# Patient Record
Sex: Male | Born: 1960 | Race: Black or African American | Hispanic: No | Marital: Single | Smoking: Light tobacco smoker
Health system: Southern US, Community
[De-identification: ages and names within clinical notes are randomized; demographics above are authoritative.]

## PROBLEM LIST (undated history)

## (undated) DIAGNOSIS — S22080A Wedge compression fracture of T11-T12 vertebra, initial encounter for closed fracture: Secondary | ICD-10-CM

## (undated) DIAGNOSIS — C61 Malignant neoplasm of prostate: Secondary | ICD-10-CM

## (undated) DIAGNOSIS — C7951 Secondary malignant neoplasm of bone: Secondary | ICD-10-CM

## (undated) HISTORY — PX: NO PAST SURGERIES: SHX2092

---

## 2017-07-12 ENCOUNTER — Ambulatory Visit (INDEPENDENT_AMBULATORY_CARE_PROVIDER_SITE_OTHER): Payer: Self-pay | Admitting: Physician Assistant

## 2017-07-26 ENCOUNTER — Emergency Department (HOSPITAL_COMMUNITY)
Admission: EM | Admit: 2017-07-26 | Discharge: 2017-07-26 | Disposition: A | Discharge status: Home / Self Care | Payer: Medicaid Other | Attending: Emergency Medicine | Admitting: Emergency Medicine

## 2017-07-26 ENCOUNTER — Other Ambulatory Visit: Payer: Self-pay

## 2017-07-26 ENCOUNTER — Encounter (HOSPITAL_COMMUNITY): Payer: Self-pay | Admitting: *Deleted

## 2017-07-26 DIAGNOSIS — R1084 Generalized abdominal pain: Secondary | ICD-10-CM | POA: Diagnosis present

## 2017-07-26 DIAGNOSIS — R109 Unspecified abdominal pain: Secondary | ICD-10-CM

## 2017-07-26 LAB — COMPREHENSIVE METABOLIC PANEL
ALT: 12 U/L — ABNORMAL LOW (ref 17–63)
AST: 20 U/L (ref 15–41)
Albumin: 3.5 g/dL (ref 3.5–5.0)
Alkaline Phosphatase: 99 U/L (ref 38–126)
Anion gap: 9 (ref 5–15)
BUN: 17 mg/dL (ref 6–20)
CO2: 23 mmol/L (ref 22–32)
Calcium: 9.2 mg/dL (ref 8.9–10.3)
Chloride: 108 mmol/L (ref 101–111)
Creatinine, Ser: 1.23 mg/dL (ref 0.61–1.24)
GFR calc Af Amer: >60 mL/min (ref >60)
GFR calc non Af Amer: >60 mL/min (ref >60)
Glucose, Bld: 98 mg/dL (ref 65–99)
Potassium: 4.5 mmol/L (ref 3.5–5.1)
Sodium: 140 mmol/L (ref 135–145)
Total Bilirubin: 0.7 mg/dL (ref 0.3–1.2)
Total Protein: 7.3 g/dL (ref 6.5–8.1)

## 2017-07-26 LAB — LIPASE, BLOOD: Lipase: 32 U/L (ref 11–51)

## 2017-07-26 LAB — CBC
HCT: 45.7 % (ref 39.0–52.0)
Hemoglobin: 16.1 g/dL (ref 13.0–17.0)
MCH: 28.7 pg (ref 26.0–34.0)
MCHC: 35.2 g/dL (ref 30.0–36.0)
MCV: 81.5 fL (ref 78.0–100.0)
Platelets: 249 10*3/uL (ref 150–400)
RBC: 5.61 MIL/uL (ref 4.22–5.81)
RDW: 14.1 % (ref 11.5–15.5)
WBC: 7 10*3/uL (ref 4.0–10.5)

## 2017-07-26 LAB — URINALYSIS, ROUTINE W REFLEX MICROSCOPIC
Bilirubin Urine: NEGATIVE
Glucose, UA: NEGATIVE mg/dL
Hgb urine dipstick: NEGATIVE
Ketones, ur: NEGATIVE mg/dL
Leukocytes, UA: NEGATIVE
Nitrite: NEGATIVE
Protein, ur: NEGATIVE mg/dL
Specific Gravity, Urine: 1.013 (ref 1.005–1.030)
pH: 5 (ref 5.0–8.0)

## 2017-07-26 MED ORDER — SODIUM CHLORIDE 0.9 % IV BOLUS (SEPSIS)
1000.0000 mL | Freq: Once | INTRAVENOUS | Status: AC
  Administered 2017-07-26: 1000 mL via INTRAVENOUS

## 2017-07-26 MED ORDER — PREDNISONE 10 MG (21) PO TBPK: ORAL_TABLET | Freq: Every day | ORAL | 0 refills | Status: DC

## 2017-07-26 MED ORDER — KETOROLAC TROMETHAMINE 30 MG/ML IJ SOLN
30.0000 mg | Freq: Once | INTRAMUSCULAR | Status: AC
  Administered 2017-07-26: 30 mg via INTRAVENOUS
  Filled 2017-07-26: qty 1
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
## 2017-07-26 NOTE — ED Triage Notes (Signed)
To ED for eval of RLQ pain and right flank pain 'for a while'. States pain never fully goes away. No pain with urination or defecation. No vomiting. Denies known fevers at home.

## 2017-07-26 NOTE — Discharge Instructions (Signed)
1. Medications: Start taking prednisone taper as prescribed.  Do not take any ibuprofen, Motrin, or Aleve while on this medicine.  You may take [phone] mg of Tylenol every 6 hours as needed for pain additionally.  Do not exceed 4000 mg of Tylenol daily. 2. Treatment: rest, drink plenty of fluids, gentle stretching as discussed, alternate ice and heat 3. Follow Up: Please followup with your primary doctor in 3 days for discussion of your diagnoses and further evaluation after today's visit; if you do not have a primary care doctor use the resource guide provided to find one;  Return to the ER for worsening back pain, difficulty walking, loss of bowel or bladder control or other concerning symptoms

## 2017-09-12 ENCOUNTER — Emergency Department (HOSPITAL_COMMUNITY): Payer: Medicaid Other

## 2017-09-12 ENCOUNTER — Inpatient Hospital Stay (HOSPITAL_COMMUNITY)
Admission: EM | Admit: 2017-09-12 | Discharge: 2017-09-19 | DRG: 516 | Disposition: A | Discharge status: Home / Self Care | Payer: Medicaid Other | Attending: Family Medicine | Admitting: Family Medicine

## 2017-09-12 ENCOUNTER — Encounter (HOSPITAL_COMMUNITY): Payer: Self-pay

## 2017-09-12 ENCOUNTER — Other Ambulatory Visit: Payer: Self-pay

## 2017-09-12 DIAGNOSIS — C7951 Secondary malignant neoplasm of bone: Principal | ICD-10-CM | POA: Diagnosis present

## 2017-09-12 DIAGNOSIS — F17210 Nicotine dependence, cigarettes, uncomplicated: Secondary | ICD-10-CM | POA: Diagnosis present

## 2017-09-12 DIAGNOSIS — K5903 Drug induced constipation: Secondary | ICD-10-CM | POA: Diagnosis present

## 2017-09-12 DIAGNOSIS — M8448XA Pathological fracture, other site, initial encounter for fracture: Secondary | ICD-10-CM | POA: Diagnosis present

## 2017-09-12 DIAGNOSIS — C801 Malignant (primary) neoplasm, unspecified: Secondary | ICD-10-CM

## 2017-09-12 DIAGNOSIS — T380X5A Adverse effect of glucocorticoids and synthetic analogues, initial encounter: Secondary | ICD-10-CM | POA: Diagnosis present

## 2017-09-12 DIAGNOSIS — T40605A Adverse effect of unspecified narcotics, initial encounter: Secondary | ICD-10-CM | POA: Diagnosis present

## 2017-09-12 DIAGNOSIS — D72829 Elevated white blood cell count, unspecified: Secondary | ICD-10-CM | POA: Diagnosis present

## 2017-09-12 DIAGNOSIS — N179 Acute kidney failure, unspecified: Secondary | ICD-10-CM | POA: Diagnosis present

## 2017-09-12 DIAGNOSIS — G893 Neoplasm related pain (acute) (chronic): Secondary | ICD-10-CM | POA: Diagnosis present

## 2017-09-12 DIAGNOSIS — C61 Malignant neoplasm of prostate: Secondary | ICD-10-CM | POA: Diagnosis present

## 2017-09-12 DIAGNOSIS — C787 Secondary malignant neoplasm of liver and intrahepatic bile duct: Secondary | ICD-10-CM | POA: Diagnosis present

## 2017-09-12 DIAGNOSIS — M545 Low back pain: Secondary | ICD-10-CM | POA: Diagnosis present

## 2017-09-12 LAB — COMPREHENSIVE METABOLIC PANEL
ALT: 23 U/L (ref 17–63)
AST: 28 U/L (ref 15–41)
Albumin: 3.5 g/dL (ref 3.5–5.0)
Alkaline Phosphatase: 106 U/L (ref 38–126)
Anion gap: 10 (ref 5–15)
BUN: 23 mg/dL — ABNORMAL HIGH (ref 6–20)
CO2: 26 mmol/L (ref 22–32)
Calcium: 9.1 mg/dL (ref 8.9–10.3)
Chloride: 103 mmol/L (ref 101–111)
Creatinine, Ser: 1.51 mg/dL — ABNORMAL HIGH (ref 0.61–1.24)
GFR calc Af Amer: 58 mL/min — ABNORMAL LOW (ref >60)
GFR calc non Af Amer: 50 mL/min — ABNORMAL LOW (ref >60)
Glucose, Bld: 102 mg/dL — ABNORMAL HIGH (ref 65–99)
Potassium: 4.1 mmol/L (ref 3.5–5.1)
Sodium: 139 mmol/L (ref 135–145)
Total Bilirubin: 1.2 mg/dL (ref 0.3–1.2)
Total Protein: 6.9 g/dL (ref 6.5–8.1)

## 2017-09-12 LAB — CBC WITH DIFFERENTIAL/PLATELET
Basophils Absolute: 0 10*3/uL (ref 0.0–0.1)
Basophils Relative: 0 %
Eosinophils Absolute: 0.1 10*3/uL (ref 0.0–0.7)
Eosinophils Relative: 1 %
HCT: 45.5 % (ref 39.0–52.0)
Hemoglobin: 16.2 g/dL (ref 13.0–17.0)
Lymphocytes Relative: 22 %
Lymphs Abs: 3.3 10*3/uL (ref 0.7–4.0)
MCH: 29.1 pg (ref 26.0–34.0)
MCHC: 35.6 g/dL (ref 30.0–36.0)
MCV: 81.8 fL (ref 78.0–100.0)
Monocytes Absolute: 1.1 10*3/uL — ABNORMAL HIGH (ref 0.1–1.0)
Monocytes Relative: 7 %
Neutro Abs: 10.4 10*3/uL — ABNORMAL HIGH (ref 1.7–7.7)
Neutrophils Relative %: 70 %
Platelets: 297 10*3/uL (ref 150–400)
RBC: 5.56 MIL/uL (ref 4.22–5.81)
RDW: 15.3 % (ref 11.5–15.5)
WBC: 14.9 10*3/uL — ABNORMAL HIGH (ref 4.0–10.5)

## 2017-09-12 MED ORDER — ENOXAPARIN SODIUM 40 MG/0.4ML ~~LOC~~ SOLN
40.0000 mg | SUBCUTANEOUS | Status: DC
  Administered 2017-09-12 – 2017-09-18 (×7): 40 mg via SUBCUTANEOUS
  Filled 2017-09-12 (×7): qty 0.4

## 2017-09-12 MED ORDER — ENOXAPARIN SODIUM 40 MG/0.4ML ~~LOC~~ SOLN: 40.0000 mg | SUBCUTANEOUS | Status: DC

## 2017-09-12 MED ORDER — OXYCODONE HCL 5 MG PO TABS
5.0000 mg | ORAL_TABLET | ORAL | Status: DC | PRN
  Administered 2017-09-12 – 2017-09-17 (×19): 5 mg via ORAL
  Filled 2017-09-12 (×19): qty 1

## 2017-09-12 MED ORDER — SODIUM CHLORIDE 0.9% FLUSH
3.0000 mL | Freq: Two times a day (BID) | INTRAVENOUS | Status: DC
  Administered 2017-09-16 – 2017-09-18 (×4): 3 mL via INTRAVENOUS

## 2017-09-12 MED ORDER — MORPHINE SULFATE (PF) 4 MG/ML IV SOLN
1.0000 mg | INTRAVENOUS | Status: DC | PRN
  Administered 2017-09-12: 2 mg via INTRAVENOUS
  Filled 2017-09-12: qty 1

## 2017-09-12 MED ORDER — SODIUM CHLORIDE 0.9 % IV SOLN
INTRAVENOUS | Status: DC
  Administered 2017-09-12 – 2017-09-18 (×10): via INTRAVENOUS

## 2017-09-12 MED ORDER — OXYCODONE-ACETAMINOPHEN 5-325 MG PO TABS
1.0000 | ORAL_TABLET | Freq: Once | ORAL | Status: AC
  Administered 2017-09-12: 1 via ORAL
  Filled 2017-09-12: qty 1

## 2017-09-12 NOTE — ED Notes (Signed)
Neuro surgery at bedside.

## 2017-09-12 NOTE — ED Notes (Signed)
Pt and family updated to delay. Per MRI, multiple patients to be transported to scan before patient. Pt and family verbalized understanding. Awaiting hospitalist consult at this time.

## 2017-09-12 NOTE — ED Provider Notes (Signed)
[facility] Provider Note   CSN: [id number] Arrival date & time: 09/12/17  1149     History   Chief Complaint Chief Complaint  Patient presents with  . Back Pain    HPI Jonathan Campbell is a 57 y.o. male who presents to the ED for evaluation of right right-sided back pain for 2 months.  He was seen and evaluated here when symptoms first began and was removed without for kidney disease, pyelonephritis and nephrolithiasis.  He completed his course of steroids and has been taking Tylenol and using muscle rub in the area with mild to no improvement in his symptoms.  Symptoms began after an incident at work as he works as doing Theatre manager for [employer].  No previous history of similar symptoms in the past.  Pain is worse with palpation and movement.  Cannot recall any other injury or fall that may have triggered the pain.  Denies any fevers, prior back surgeries, history of cancer, history of IV drug use, numbness in legs, dysuria, hematuria, prior kidney issues, changes in gait.  HPI  History reviewed. No pertinent past medical history.  There are no active problems to display for this patient.   History reviewed. No pertinent surgical history.     Home Medications    Prior to Admission medications   Medication Sig Start Date End Date Taking? Authorizing Provider  acetaminophen (TYLENOL) 500 MG tablet Take 1,000 mg by mouth every 6 (six) hours as needed for mild pain.    [provider]  predniSONE (STERAPRED UNI-PAK 21 TAB) 10 MG (21) TBPK tablet Take by mouth daily. Take 6 tabs by mouth daily  for 2 days, then 5 tabs for 2 days, then 4 tabs for 2 days, then 3 tabs for 2 days, 2 tabs for 2 days, then 1 tab by mouth daily for 2 days 07/26/17   Renita Papa, PA-C    Family History History reviewed. No pertinent family history.  Social History Social History   Tobacco Use  . Smoking status: Current Some Day Smoker    Types: Cigars   Substance Use Topics  . Alcohol use: Yes    Comment: occ  . Drug use: Yes    Types: Marijuana    Comment: occ     Allergies   Patient has no known allergies.   Review of Systems Review of Systems  Constitutional: Negative for appetite change, chills and fever.  HENT: Negative for ear pain, rhinorrhea, sneezing and sore throat.   Eyes: Negative for photophobia and visual disturbance.  Respiratory: Negative for cough, chest tightness, shortness of breath and wheezing.   Cardiovascular: Negative for chest pain and palpitations.  Gastrointestinal: Negative for abdominal pain, blood in stool, constipation, diarrhea, nausea and vomiting.  Genitourinary: Negative for dysuria, flank pain, hematuria, penile swelling, testicular pain and urgency.  Musculoskeletal: Positive for back pain. Negative for gait problem, joint swelling, myalgias, neck pain and neck stiffness.  Skin: Negative for rash and wound.  Neurological: Negative for dizziness, weakness, light-headedness and numbness.     Physical Exam Updated Vital Signs BP 125/79   Pulse (!) 58   Temp 98 F (36.7 C) (Oral)   Resp 14   Ht 5\' 9"  (1.753 m)   Wt 74.8 kg (165 lb)   SpO2 97%   BMI 24.37 kg/m   Physical Exam  Constitutional: He appears well-developed and well-nourished. No distress.  Nontoxic-appearing no acute distress.  Ambulating with normal gait.  HENT:  Head: Normocephalic and atraumatic.  Eyes: Conjunctivae and EOM are normal. No scleral icterus.  Neck: Normal range of motion.  Pulmonary/Chest: Effort normal. No respiratory distress.  Musculoskeletal:       Back:  No midline spinal tenderness present in lumbar, thoracic or cervical spine. No step-off palpated. No visible bruising, edema or temperature change noted. No objective signs of numbness present. No saddle anesthesia. 2+ DP pulses bilaterally. Sensation intact to light touch. Strength 5/5 in bilateral lower extremities.  Neurological: He is alert.    Skin: No rash noted. He is not diaphoretic.  Psychiatric: He has a normal mood and affect.  Nursing note and vitals reviewed.    ED Treatments / Results  Labs (all labs ordered are listed, but only abnormal results are displayed) Labs Reviewed  CBC WITH DIFFERENTIAL/PLATELET - Abnormal; Notable for the following components:      Result Value   WBC 14.9 (*)    Neutro Abs 10.4 (*)    Monocytes Absolute 1.1 (*)    All other components within normal limits  COMPREHENSIVE METABOLIC PANEL - Abnormal; Notable for the following components:   Glucose, Bld 102 (*)    BUN 23 (*)    Creatinine, Ser 1.51 (*)    GFR calc non Af Amer 50 (*)    GFR calc Af Amer 58 (*)    All other components within normal limits    EKG  EKG Interpretation None       Radiology Dg Chest 2 View  Result Date: 09/12/2017 CLINICAL DATA:  CT today revealing lytic lesion in the right T12 vertebral body. Pt has been feeling unwell and suffering from back and side pain since January. EXAM: CHEST - 2 VIEW COMPARISON:  CT abdomen from earlier same day. No previous chest x-ray. FINDINGS: Heart size and mediastinal contours are normal. Lungs are clear. No pleural effusion seen. No acute or suspicious osseous findings. Lytic changes at the T12 vertebral body level are better demonstrated on earlier CT IMPRESSION: No active cardiopulmonary disease. No evidence of primary intrathoracic malignancy identified. Electronically Signed   By: Franki Cabot M.D.   On: 09/12/2017 15:20   Ct Renal Stone Study  Result Date: 09/12/2017 CLINICAL DATA:  Right flank pain for 1 month. EXAM: CT ABDOMEN AND PELVIS WITHOUT CONTRAST TECHNIQUE: Multidetector CT imaging of the abdomen and pelvis was performed following the standard protocol without IV contrast. COMPARISON:  None. FINDINGS: Lower chest: Tiny calcified nodule in the left lower lobe. Minimal bibasilar scarring. No pleural effusion. Normal heart size. Hepatobiliary: Innumerable  low-density lesions throughout the liver measuring up to 6 cm in size and with attenuation suggestive of cysts. Hepatic enlargement, with the right lobe measuring 20 cm in craniocaudal length. Underdistended gallbladder containing intermediate density material, possibly concentrated bile/sludge. No gross gallbladder wall thickening or acute pericholecystic inflammatory changes. No biliary dilatation. Pancreas: Unremarkable. Spleen: Unremarkable. Adrenals/Urinary Tract: Unremarkable adrenal glands. Subcentimeter low-density lesion in the left kidney, too small to fully characterize. No renal calculi or hydronephrosis. Unremarkable bladder. Stomach/Bowel: The stomach is within normal limits. There is no evidence of bowel obstruction or inflammation. The appendix is unremarkable. Vascular/Lymphatic: Normal caliber of the abdominal aorta. Borderline to mildly enlarged lower left para-aortic lymph node measuring 10 mm in short axis. Enlarged left common iliac lymph node measures 1.6 cm. Enlarged external iliac lymph nodes measure 1.6 cm on the right and 2.8 and 2.2 cm on the left. Reproductive: Mild enlargement of the prostate gland. Other: No intraperitoneal free  fluid.  No abdominal wall hernia. Musculoskeletal: 3.3 cm predominantly lytic lesion in the right T12 vertebral body and posterior elements with right-sided vertebral compression fracture demonstrating 35% height loss. There is focal disruption of the posterior T12 vertebral body cortex and slight right-sided vertebral body retropulsion with possible small volume epidural tumor and likely mild spinal stenosis. 5 mm lytic lesion posteriorly in the T11 vertebral body. 6 mm lytic lesion in the left L4 inferior articular process. IMPRESSION: 1. Multiple lytic lesions in the spine and bilateral pelvic lymphadenopathy concerning for metastatic disease. 2. 3.3 cm lesion at T12 with pathologic compression fracture and possible small volume epidural tumor. Consider  contrast-enhanced thoracic spine MRI for further evaluation. 3. Enlarged liver containing innumerable low-density lesions. Considerations include metastases and benign polycystic liver disease. Contrast-enhanced abdominal MRI is recommended for further evaluation. Electronically Signed   By: Logan Bores M.D.   On: 09/12/2017 14:43    Procedures Procedures (including critical care time)  Medications Ordered in ED Medications  oxyCODONE-acetaminophen (PERCOCET/ROXICET) 5-325 MG per tablet 1 tablet (1 tablet Oral Given 09/12/17 1550)     Initial Impression / Assessment and Plan / ED Course  I have reviewed the triage vital signs and the nursing notes.  Pertinent labs & imaging results that were available during my care of the patient were reviewed by me and considered in my medical decision making (see chart for details).     Patient presents to ED for evaluation of almost 50-month history of right-sided back pain.  He was seen and evaluated at the ED when symptoms began and was discharged home with prednisone pack after negative UA and unremarkable lab work.  He tells me that he completed the course of steroids but that his pain is not improved despite the use of over-the-counter medications and muscle rubs as well.  He is concerned because his brother had similar symptoms recently and was diagnosed with kidney cancer and had to undergo a right-sided nephrectomy.  On physical exam patient has a focal area of tenderness noted to the right side of the thoracic paraspinal musculature.  He denies any cough, hemoptysis, loss of bowel or bladder function, numbness in legs, weight loss, changes in appetite or night sweats.  He smokes tobacco intermittently.  CT renal stone study showed several lesions in the spine and bilateral pelvic lymphadenopathy that was concerning for metastatic disease.  It also showed 3.3 cm lesion at T12 with pathologic compression fracture and possibility of small volume epidural  tumor.  Recommended contrast enhanced thoracic spine MRI.  No neurological deficits noted and ambulating with normal gait. After speaking to the patient about this, we both agreed that would be best for patient be admitted for further workup to find the source of his possible malignant disease.  Lab work with mild elevation BUN and creatinine as compared to 6 weeks ago.  Chest x-ray revealed no signs of primary intrathoracic malignancy.  Will admit to hospitalist for further management and workup of patient's possible malignant disease.  Patient is agreement with this plan. Spoke to hospitalist who requests we contact neurosurgery for their plan. Contacted Dr. Christella Noa of neurosurgery who will come down to evaluate the patient. MRI pending at this time. Discussed with and seen by my attending, Dr. Venora Maples.  Portions of this note were generated with [employer]. Dictation errors may occur despite best attempts at proofreading.   Final Clinical Impressions(s) / ED Diagnoses   Final diagnoses:  None    ED  Discharge Orders    None       Delia Heady, PA-C 09/12/17 1747    Jola Schmidt, MD 09/13/17 1124

## 2017-09-12 NOTE — Progress Notes (Signed)
Received from ED, wife at bedside, oriented to room and surroundings. Denies nausea/pain at this time

## 2017-09-12 NOTE — ED Notes (Signed)
Provider at bedside

## 2017-09-12 NOTE — ED Notes (Signed)
Pt ambulated to bathroom with steady gait. 

## 2017-09-12 NOTE — ED Triage Notes (Signed)
Pt endorses lower right back pain x 1.5 months. Pt seen here then, had blood work drawn and urine and was told "my kidneys are fine" Pain is worse with movement. VSS. Denies urinary sx.

## 2017-09-12 NOTE — ED Notes (Signed)
Diet tray ordered 

## 2017-09-12 NOTE — ED Notes (Signed)
Attempted report x1. 

## 2017-09-12 NOTE — H&P (Signed)
57 year old male no significant past medical history seen 06/2017 in the emergency room for chest pain flank pain given steroid Dosepak as well as pain medication and had no significant improvement he had a repeat episode and came to emergency room to get checked out  At first visit when he came to the emergency room labs were normal although he had a mildly elevated BUN/creatinine 17/1.2  Today comes back and his urine/creatinine has bumped to 23/1.5 he has a leukocytosis of 14 predominant left shift platelets 297 hemoglobin is 16 because of the pain he had a CT renal stone study that was performed showing lytic lesions in the spine bilateral pelvic lymphadenopathy concerning for metastatic disease 3.3 lesion at T12 with pathologic compression fracture and small volume epidural tumor and an enlarged liver with multiple innumerable low-density lesions an MRI was ordered by the emergency room   he has been taking without much relief Aspercreme, aspirin, ibuprofen and steroids that were prescribed to him  Review of systems Patient states that he has no other real complaints other than 7/10 pain in the back He has no saddle anesthesia no overall weakness no loss of continence of bowel or bladder no nausea no vomiting no hematuria no melena no chest pain no fever no chills no nausea no weight loss no night sweats, he has no lower extremity edema he has no unilateral weakness he has no seizure-like activity  Pain noted in flank area of right side with radiating pattern down area from T12 down paravertebral muscle feels like tripping of water he says  Past medical history none  Social history-smokes 1-2 cigarettes a day smokes more marijuana than tobacco, 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 \ Family history father and mother died of cancer unknown type Brother had a kidney removed secondary to kidney cancer Grandparents 1  of them had pancreatic cancer Family has diabetes mellitus runs in the family and hypertension  BP 125/79   Pulse (!) 58   Temp 98 F (36.7 C) (Oral)   Resp 14   Ht 5\' 9"  (1.753 m)   Wt 74.8 kg (165 lb)   SpO2 97%   BMI 24.37 kg/m    Awake alert pleasant no distress EOMI NCAT slight scleral icterus Abdomen soft nontender no rebound No lymphadenopathy S1-S2 no murmur rub or gallop Smile symmetric Sensory and lower extremities is normal all the way up to mid thigh area Power is 5/5 although slight discrepancy between right and left side he is slightly weak on the right side   Assessment/plan 57 year old male with no medical history now with symptoms signs concerning for metastatic cancer unknown primary  Plan  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  Epidural tumor-we will ask neurosurgery to see the patient once MRI of the thoracic spine is done that better characterizes a tumor hopefully we can get a biopsy and then decide on workup of the same with stains and we will refer him to oncology at that time.  I have not discussed with him further diagnostics but prognosis is not to good if he has metastatic disease-he will need multidisciplinary follow-up as an outpatient For his pain now place him on oxycodone with morphine as a follow-up for severe pain  Acute kidney injury secondary probably to nonsteroidal meds we will ask him to use IV saline 100 cc an hour for the next 24 hours and hopefully this will resolve  Leukocytosis potentially secondary to use of steroids recently versus unknown primary  Lovenox, inpatient, discussed with fianc, MedSurg

## 2017-09-12 NOTE — ED Notes (Signed)
Patient transported to CT 

## 2017-09-12 NOTE — ED Notes (Signed)
Main pharmacy called for pain medication verfication

## 2017-09-12 NOTE — Consult Note (Signed)
Reason for Consult:pathologic T12 fracture Referring Physician: ED  Jonathan Campbell is an 57 y.o. male.  HPI: whom has dealt with back and right flank pain. He presented to the [facility] ED approximately 6 weeks ago. No imaging performed, he was given prednisone. The prednisone did not relieve his pain. He may very well have had the fracture occur when he was pushing the car prior to the first ed visit. He reports urinary urgency, and frequency. No incontinence bowel, and or bladder. No falls, no weakness, or sensory changes reported. CT shows multiple lytic lesions and a pathologic fracture at T12, and multiple enlarged lymph nodes in the pelvis, along with a host of cystic lesions in the liver. Called regarding the spinal lesions.   History reviewed. No pertinent past medical history.  History reviewed. No pertinent surgical history.  History reviewed. No pertinent family history.  Social History:  reports that he has been smoking cigars.  He does not have any smokeless tobacco history on file. He reports that he drinks alcohol. He reports that he uses drugs. Drug: Marijuana.  Allergies: No Known Allergies  Medications: I have reviewed the patient's current medications.  Results for orders placed or performed during the hospital encounter of 09/12/17 (from the past 48 hour(s))  CBC with Differential     Status: Abnormal   Collection Time: 09/12/17  3:55 PM  Result Value Ref Range   WBC 14.9 (H) 4.0 - 10.5 K/uL   RBC 5.56 4.22 - 5.81 MIL/uL   Hemoglobin 16.2 13.0 - 17.0 g/dL   HCT 45.5 39.0 - 52.0 %   MCV 81.8 78.0 - 100.0 fL   MCH 29.1 26.0 - 34.0 pg   MCHC 35.6 30.0 - 36.0 g/dL   RDW 15.3 11.5 - 15.5 %   Platelets 297 150 - 400 K/uL   Neutrophils Relative % 70 %   Neutro Abs 10.4 (H) 1.7 - 7.7 K/uL   Lymphocytes Relative 22 %   Lymphs Abs 3.3 0.7 - 4.0 K/uL   Monocytes Relative 7 %   Monocytes Absolute 1.1 (H) 0.1 - 1.0 K/uL   Eosinophils Relative 1 %   Eosinophils Absolute 0.1 0.0 -  0.7 K/uL   Basophils Relative 0 %   Basophils Absolute 0.0 0.0 - 0.1 K/uL    Comment: Performed at [facility], [address], [place], [place] [address]  Comprehensive metabolic panel     Status: Abnormal   Collection Time: 09/12/17  3:55 PM  Result Value Ref Range   Sodium 139 135 - 145 mmol/L   Potassium 4.1 3.5 - 5.1 mmol/L   Chloride 103 101 - 111 mmol/L   CO2 26 22 - 32 mmol/L   Glucose, Bld 102 (H) 65 - 99 mg/dL   BUN 23 (H) 6 - 20 mg/dL   Creatinine, Ser 1.51 (H) 0.61 - 1.24 mg/dL   Calcium 9.1 8.9 - 10.3 mg/dL   Total Protein 6.9 6.5 - 8.1 g/dL   Albumin 3.5 3.5 - 5.0 g/dL   AST 28 15 - 41 U/L   ALT 23 17 - 63 U/L   Alkaline Phosphatase 106 38 - 126 U/L   Total Bilirubin 1.2 0.3 - 1.2 mg/dL   GFR calc non Af Amer 50 (L) >60 mL/min   GFR calc Af Amer 58 (L) >60 mL/min    Comment: (NOTE) The eGFR has been calculated using the CKD EPI equation. This calculation has not been validated in all clinical situations. eGFR's persistently <  60 mL/min signify possible Chronic Kidney Disease.    Anion gap 10 5 - 15    Comment: Performed at [facility] [address], [place], [place] [address]    Dg Chest 2 View  Result Date: 09/12/2017 CLINICAL DATA:  CT today revealing lytic lesion in the right T12 vertebral body. Pt has been feeling unwell and suffering from back and side pain since January. EXAM: CHEST - 2 VIEW COMPARISON:  CT abdomen from earlier same day. No previous chest x-ray. FINDINGS: Heart size and mediastinal contours are normal. Lungs are clear. No pleural effusion seen. No acute or suspicious osseous findings. Lytic changes at the T12 vertebral body level are better demonstrated on earlier CT IMPRESSION: No active cardiopulmonary disease. No evidence of primary intrathoracic malignancy identified. Electronically Signed   By: Franki Cabot M.D.   On: 09/12/2017 15:20   Ct Renal Stone Study  Result Date: 09/12/2017 CLINICAL DATA:  Right flank pain for 1  month. EXAM: CT ABDOMEN AND PELVIS WITHOUT CONTRAST TECHNIQUE: Multidetector CT imaging of the abdomen and pelvis was performed following the standard protocol without IV contrast. COMPARISON:  None. FINDINGS: Lower chest: Tiny calcified nodule in the left lower lobe. Minimal bibasilar scarring. No pleural effusion. Normal heart size. Hepatobiliary: Innumerable low-density lesions throughout the liver measuring up to 6 cm in size and with attenuation suggestive of cysts. Hepatic enlargement, with the right lobe measuring 20 cm in craniocaudal length. Underdistended gallbladder containing intermediate density material, possibly concentrated bile/sludge. No gross gallbladder wall thickening or acute pericholecystic inflammatory changes. No biliary dilatation. Pancreas: Unremarkable. Spleen: Unremarkable. Adrenals/Urinary Tract: Unremarkable adrenal glands. Subcentimeter low-density lesion in the left kidney, too small to fully characterize. No renal calculi or hydronephrosis. Unremarkable bladder. Stomach/Bowel: The stomach is within normal limits. There is no evidence of bowel obstruction or inflammation. The appendix is unremarkable. Vascular/Lymphatic: Normal caliber of the abdominal aorta. Borderline to mildly enlarged lower left para-aortic lymph node measuring 10 mm in short axis. Enlarged left common iliac lymph node measures 1.6 cm. Enlarged external iliac lymph nodes measure 1.6 cm on the right and 2.8 and 2.2 cm on the left. Reproductive: Mild enlargement of the prostate gland. Other: No intraperitoneal free fluid.  No abdominal wall hernia. Musculoskeletal: 3.3 cm predominantly lytic lesion in the right T12 vertebral body and posterior elements with right-sided vertebral compression fracture demonstrating 35% height loss. There is focal disruption of the posterior T12 vertebral body cortex and slight right-sided vertebral body retropulsion with possible small volume epidural tumor and likely mild spinal  stenosis. 5 mm lytic lesion posteriorly in the T11 vertebral body. 6 mm lytic lesion in the left L4 inferior articular process. IMPRESSION: 1. Multiple lytic lesions in the spine and bilateral pelvic lymphadenopathy concerning for metastatic disease. 2. 3.3 cm lesion at T12 with pathologic compression fracture and possible small volume epidural tumor. Consider contrast-enhanced thoracic spine MRI for further evaluation. 3. Enlarged liver containing innumerable low-density lesions. Considerations include metastases and benign polycystic liver disease. Contrast-enhanced abdominal MRI is recommended for further evaluation. Electronically Signed   By: Logan Bores M.D.   On: 09/12/2017 14:43    Review of Systems  Constitutional: Negative.   HENT: Negative.   Eyes: Negative.   Respiratory: Negative.   Cardiovascular: Positive for chest pain.  Genitourinary: Positive for frequency and urgency.  Musculoskeletal: Positive for back pain.  Skin: Negative.   Neurological: Negative.   Psychiatric/Behavioral: Negative.    Blood pressure 135/79, pulse 67, temperature 98  F (36.7 C), temperature source Oral, resp. rate 14, height '5\' 9"'$  (1.753 m), weight 74.8 kg (165 lb), SpO2 97 %. Physical Exam  Constitutional: He is oriented to person, place, and time. He appears well-developed and well-nourished.  HENT:  Head: Normocephalic and atraumatic.  Right Ear: External ear normal.  Left Ear: External ear normal.  Eyes: EOM are normal. Pupils are equal, round, and reactive to light.  Neck: Normal range of motion. Neck supple.  Cardiovascular: Normal rate and regular rhythm.  Respiratory: Effort normal and breath sounds normal.  Musculoskeletal:  Pain in the back  Neurological: He is alert and oriented to person, place, and time. He has normal strength and normal reflexes. He displays no tremor. No cranial nerve deficit. He exhibits normal muscle tone. Coordination normal.  Proprioception is normal in the  upper and lower extremities  Skin: Skin is warm and dry.  Psychiatric: He has a normal mood and affect. His behavior is normal. Judgment and thought content normal.    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 He does not need surgery for the spine, but the spine is the most likely cause of the back pain. He needs a tlso , but not a clamshell. For now bedrest is more than sufficient. He needs a diagnosis as soon as possible, and oncology. Would contact radiology to perform biopsy of spine or liver, whichever is easiest. There is absolutely no neurosurgical intervention indicated at this time.   Chardonay Scritchfield L 09/12/2017, 6:49 PM

## 2017-09-12 NOTE — ED Notes (Signed)
Neurosurgery still assessing pt at this time. Will administer ordered pain medications when assessment is complete

## 2017-09-13 ENCOUNTER — Inpatient Hospital Stay (HOSPITAL_COMMUNITY): Payer: Medicaid Other

## 2017-09-13 DIAGNOSIS — D492 Neoplasm of unspecified behavior of bone, soft tissue, and skin: Secondary | ICD-10-CM

## 2017-09-13 DIAGNOSIS — F17290 Nicotine dependence, other tobacco product, uncomplicated: Secondary | ICD-10-CM

## 2017-09-13 LAB — COMPREHENSIVE METABOLIC PANEL
ALT: 18 U/L (ref 17–63)
AST: 19 U/L (ref 15–41)
Albumin: 3.1 g/dL — ABNORMAL LOW (ref 3.5–5.0)
Alkaline Phosphatase: 98 U/L (ref 38–126)
Anion gap: 9 (ref 5–15)
BUN: 18 mg/dL (ref 6–20)
CO2: 24 mmol/L (ref 22–32)
Calcium: 8.7 mg/dL — ABNORMAL LOW (ref 8.9–10.3)
Chloride: 103 mmol/L (ref 101–111)
Creatinine, Ser: 1.32 mg/dL — ABNORMAL HIGH (ref 0.61–1.24)
GFR calc Af Amer: >60 mL/min (ref >60)
GFR calc non Af Amer: 59 mL/min — ABNORMAL LOW (ref >60)
Glucose, Bld: 101 mg/dL — ABNORMAL HIGH (ref 65–99)
Potassium: 4.1 mmol/L (ref 3.5–5.1)
Sodium: 136 mmol/L (ref 135–145)
Total Bilirubin: 0.8 mg/dL (ref 0.3–1.2)
Total Protein: 6.6 g/dL (ref 6.5–8.1)

## 2017-09-13 LAB — HIV ANTIBODY (ROUTINE TESTING W REFLEX): HIV Screen 4th Generation wRfx: NONREACTIVE

## 2017-09-13 LAB — CBC
HCT: 43.3 % (ref 39.0–52.0)
Hemoglobin: 15.3 g/dL (ref 13.0–17.0)
MCH: 29.1 pg (ref 26.0–34.0)
MCHC: 35.3 g/dL (ref 30.0–36.0)
MCV: 82.5 fL (ref 78.0–100.0)
Platelets: 270 10*3/uL (ref 150–400)
RBC: 5.25 MIL/uL (ref 4.22–5.81)
RDW: 15.3 % (ref 11.5–15.5)
WBC: 10.7 10*3/uL — ABNORMAL HIGH (ref 4.0–10.5)

## 2017-09-13 MED ORDER — IOPAMIDOL (ISOVUE-300) INJECTION 61%
INTRAVENOUS | Status: AC
  Administered 2017-09-13: 75 mL
  Filled 2017-09-13: qty 75

## 2017-09-13 MED ORDER — GADOBENATE DIMEGLUMINE 529 MG/ML IV SOLN
15.0000 mL | Freq: Once | INTRAVENOUS | Status: AC
  Administered 2017-09-13: 15 mL via INTRAVENOUS

## 2017-09-13 NOTE — Consult Note (Signed)
Chief Complaint: Patient was seen in consultation today for lymphadenopathy  Referring Physician(s): Dr. Verneita Griffes  Supervising Physician: Jacqulynn Cadet  Patient Status: [facility] - In-pt  History of Present Illness: Jonathan Campbell is a 57 y.o. male with no significant past medical history who has had several months history of back and flank pain.  CT Renal Stone Study 09/12/17 showed: 1. Multiple lytic lesions in the spine and bilateral pelvic lymphadenopathy concerning for metastatic disease. 2. 3.3 cm lesion at T12 with pathologic compression fracture and possible small volume epidural tumor. Consider contrast-enhanced thoracic spine MRI for further evaluation. 3. Enlarged liver containing innumerable low-density lesions. Considerations include metastases and benign polycystic liver disease. Contrast-enhanced abdominal MRI is recommended for further evaluation.  MRI Thoracic Spine 09/13/17 showed: 1. Numerous lesions throughout the thoracic spine as well as in C7, consistent with metastatic disease. 2. Extensive tumor in the T12 vertebra with epidural extension slightly compressing the thecal sac. Minimal right paraspinal soft tissue extension at T12. Pathologic fracture of T12.  IR consulted for biopsy at the request of Dr. Verlon Au.  Case reviewed by Dr. Vernard Gambles who approves biopsy of the iliac lymphadenopathy.   History reviewed. No pertinent past medical history.  History reviewed. No pertinent surgical history.  Allergies: Patient has no known allergies.  Medications: Prior to Admission medications   Medication Sig Start Date End Date Taking? Authorizing Provider  acetaminophen (TYLENOL) 500 MG tablet Take 1,000 mg by mouth every 6 (six) hours as needed for mild pain.   Yes [provider]  predniSONE (STERAPRED UNI-PAK 48 TAB) 10 MG (48) TBPK tablet Take 10 mg by mouth. 08/31/17   [provider]     History reviewed. No pertinent family  history.  Social History   Socioeconomic History  . Marital status: Single    Spouse name: None  . Number of children: None  . Years of education: None  . Highest education level: None  Social Needs  . Financial resource strain: None  . Food insecurity - worry: None  . Food insecurity - inability: None  . Transportation needs - medical: None  . Transportation needs - non-medical: None  Occupational History  . None  Tobacco Use  . Smoking status: Current Some Day Smoker    Types: Cigars  Substance and Sexual Activity  . Alcohol use: Yes    Comment: occ  . Drug use: Yes    Types: Marijuana    Comment: occ  . Sexual activity: None  Other Topics Concern  . None  Social History Narrative  . None     Review of Systems: A 12 point ROS discussed and pertinent positives are indicated in the HPI above.  All other systems are negative.  Review of Systems  Constitutional: Negative for fatigue and fever.  Respiratory: Negative for cough and shortness of breath.   Cardiovascular: Negative for chest pain.  Gastrointestinal: Negative for abdominal pain.  Musculoskeletal: Positive for back pain (right flank).  Psychiatric/Behavioral: Negative for behavioral problems and confusion.    Vital Signs: BP 130/78 (BP Location: Right Arm)   Pulse 62   Temp 98.9 F (37.2 C) (Oral)   Resp 17   Ht 5\' 9"  (1.753 m)   Wt 167 lb (75.8 kg)   SpO2 99%   BMI 24.66 kg/m   Physical Exam  Constitutional: He is oriented to person, place, and time. He appears well-developed.  Cardiovascular: Normal rate, regular rhythm and normal heart sounds.  Pulmonary/Chest: Effort normal and  breath sounds normal. No respiratory distress.  Abdominal: Soft. There is no tenderness.  Musculoskeletal: He exhibits tenderness.  Neurological: He is alert and oriented to person, place, and time.  Skin: Skin is warm and dry.  Psychiatric: He has a normal mood and affect. His behavior is normal. Judgment and  thought content normal.  Nursing note and vitals reviewed.        Imaging: Dg Chest 2 View  Result Date: 09/12/2017 CLINICAL DATA:  CT today revealing lytic lesion in the right T12 vertebral body. Pt has been feeling unwell and suffering from back and side pain since January. EXAM: CHEST - 2 VIEW COMPARISON:  CT abdomen from earlier same day. No previous chest x-ray. FINDINGS: Heart size and mediastinal contours are normal. Lungs are clear. No pleural effusion seen. No acute or suspicious osseous findings. Lytic changes at the T12 vertebral body level are better demonstrated on earlier CT IMPRESSION: No active cardiopulmonary disease. No evidence of primary intrathoracic malignancy identified. Electronically Signed   By: Franki Cabot M.D.   On: 09/12/2017 15:20   Mr Thoracic Spine W Wo Contrast  Result Date: 09/13/2017 CLINICAL DATA:  Lytic mass in the T12 vertebra with pathologic fracture. Back and right flank pain. EXAM: MRI THORACIC WITHOUT AND WITH CONTRAST TECHNIQUE: Multiplanar and multiecho pulse sequences of the thoracic spine were obtained without and with intravenous contrast. CONTRAST:  60mL MULTIHANCE GADOBENATE DIMEGLUMINE 529 MG/ML IV SOLN COMPARISON:  Chest x-ray dated 09/12/2017 and CT scan of the abdomen and pelvis dated 09/12/2017 FINDINGS: MRI THORACIC SPINE FINDINGS Alignment:  Physiologic. Vertebrae: There are small metastatic lesions in multiple vertebral bodies including C7, T5, T6, T8, T9, T10, T11 and L1. There is a larger lesion involving the majority of the T12 vertebra extending into the right pedicle and facets with a pathologic fracture of the posterior aspect of the right side of the vertebral body. Tumor extends into the epidural space anteriorly to the right and left of midline and into the right neural foramina at T11-12 and T12-L1. There is compression of the thecal sac. Distal spinal cord is slightly compressed without myelopathy. Tip of the conus is at L1. Cord:  The thoracic spinal cord appears normal except for the mass effect of the epidural tumor at T12. No myelopathy. Paraspinal and other soft tissues: There is slight extension of tumor into the paraspinal soft tissues to the right of midline at T12 best seen on images 19 of series 11 and series 15. Disc levels: There are no disc protrusions or disc bulges. IMPRESSION: 1. Numerous lesions throughout the thoracic spine as well as in C7, consistent with metastatic disease. 2. Extensive tumor in the T12 vertebra with epidural extension slightly compressing the thecal sac. Minimal right paraspinal soft tissue extension at T12. Pathologic fracture of T12. Electronically Signed   By: Lorriane Shire M.D.   On: 09/13/2017 10:19   Ct Renal Stone Study  Result Date: 09/12/2017 CLINICAL DATA:  Right flank pain for 1 month. EXAM: CT ABDOMEN AND PELVIS WITHOUT CONTRAST TECHNIQUE: Multidetector CT imaging of the abdomen and pelvis was performed following the standard protocol without IV contrast. COMPARISON:  None. FINDINGS: Lower chest: Tiny calcified nodule in the left lower lobe. Minimal bibasilar scarring. No pleural effusion. Normal heart size. Hepatobiliary: Innumerable low-density lesions throughout the liver measuring up to 6 cm in size and with attenuation suggestive of cysts. Hepatic enlargement, with the right lobe measuring 20 cm in craniocaudal length. Underdistended gallbladder containing intermediate density material,  possibly concentrated bile/sludge. No gross gallbladder wall thickening or acute pericholecystic inflammatory changes. No biliary dilatation. Pancreas: Unremarkable. Spleen: Unremarkable. Adrenals/Urinary Tract: Unremarkable adrenal glands. Subcentimeter low-density lesion in the left kidney, too small to fully characterize. No renal calculi or hydronephrosis. Unremarkable bladder. Stomach/Bowel: The stomach is within normal limits. There is no evidence of bowel obstruction or inflammation. The  appendix is unremarkable. Vascular/Lymphatic: Normal caliber of the abdominal aorta. Borderline to mildly enlarged lower left para-aortic lymph node measuring 10 mm in short axis. Enlarged left common iliac lymph node measures 1.6 cm. Enlarged external iliac lymph nodes measure 1.6 cm on the right and 2.8 and 2.2 cm on the left. Reproductive: Mild enlargement of the prostate gland. Other: No intraperitoneal free fluid.  No abdominal wall hernia. Musculoskeletal: 3.3 cm predominantly lytic lesion in the right T12 vertebral body and posterior elements with right-sided vertebral compression fracture demonstrating 35% height loss. There is focal disruption of the posterior T12 vertebral body cortex and slight right-sided vertebral body retropulsion with possible small volume epidural tumor and likely mild spinal stenosis. 5 mm lytic lesion posteriorly in the T11 vertebral body. 6 mm lytic lesion in the left L4 inferior articular process. IMPRESSION: 1. Multiple lytic lesions in the spine and bilateral pelvic lymphadenopathy concerning for metastatic disease. 2. 3.3 cm lesion at T12 with pathologic compression fracture and possible small volume epidural tumor. Consider contrast-enhanced thoracic spine MRI for further evaluation. 3. Enlarged liver containing innumerable low-density lesions. Considerations include metastases and benign polycystic liver disease. Contrast-enhanced abdominal MRI is recommended for further evaluation. Electronically Signed   By: Logan Bores M.D.   On: 09/12/2017 14:43    Labs:  CBC: Recent Labs    07/26/17 1054 09/12/17 1555 09/13/17 0622  WBC 7.0 14.9* 10.7*  HGB 16.1 16.2 15.3  HCT 45.7 45.5 43.3  PLT 249 297 270    COAGS: No results for input(s): INR, APTT in the last 8760 hours.  BMP: Recent Labs    07/26/17 1054 09/12/17 1555 09/13/17 0622  NA 140 139 136  K 4.5 4.1 4.1  CL 108 103 103  CO2 23 26 24   GLUCOSE 98 102* 101*  BUN 17 23* 18  CALCIUM 9.2 9.1  8.7*  CREATININE 1.23 1.51* 1.32*  GFRNONAA >60 50* 59*  GFRAA >60 58* >60    LIVER FUNCTION TESTS: Recent Labs    07/26/17 1054 09/12/17 1555 09/13/17 0622  BILITOT 0.7 1.2 0.8  AST 20 28 19   ALT 12* 23 18  ALKPHOS 99 106 98  PROT 7.3 6.9 6.6  ALBUMIN 3.5 3.5 3.1*    TUMOR MARKERS: No results for input(s): AFPTM, CEA, CA199, CHROMGRNA in the last 8760 hours.  Assessment and Plan: Patient with no significant past medical history presents with complaint of right flank pain.  CT Renal Stone study performed yesterday shows spinous lesions as well as lymphadenopathy.  IR consulted for biopsy at the request of Dr. Verlon Au. Case reviewed by Dr. Vernard Gambles who approves patient for lymph node biopsy.  He has been NPO today.  He last received lovenox at 9PM last evening.   Risks and benefits discussed with the patient including, but not limited to bleeding, infection, damage to adjacent structures or low yield requiring additional tests.  All of the patient's questions were answered, patient is agreeable to proceed. Consent signed and in chart.  Thank you for this interesting consult.  I greatly enjoyed meeting Finnian Husted and look forward to participating in their care.  A copy of this report was sent to the requesting provider on this date.  Electronically Signed: Docia Barrier, PA 09/13/2017, 10:51 AM   I spent a total of 40 Minutes    in face to face in clinical consultation, greater than 50% of which was counseling/coordinating care for lymphadenopathy.

## 2017-09-13 NOTE — Consult Note (Signed)
[facility] CONSULT NOTE  Patient Care Team: Patient, No Pcp Per as PCP - General (General Practice)  CHIEF COMPLAINTS/PURPOSE OF CONSULTATION:  Back Pain Spinal Tumor  HISTORY OF PRESENTING ILLNESS:  Jonathan Campbell 57 y.o. male presented to medical attention yesterday with several months of persistent lower back pain, described as 7/10.  The discomfort started in the right flank and moved to involve the lower back midline.  He has taken Tylenol and a steroid dose pack without any symptom relief.  Imaging demonstrated a lytic T12 mass with clear epidural extension as the culprit.  Unfortunately, diffuse vertebral lesions and lymph nodes were noted as a sign of likely metastatic neoplasm.  He acknowledges some urinary urgency, but denies weakness, numbness in the legs. The plan at this time is for IR biopsy of a pelvic node, likely tomorrow AM.       MEDICAL HISTORY:  History reviewed. No pertinent past medical history.  SURGICAL HISTORY: History reviewed. No pertinent surgical history.  SOCIAL HISTORY: Social History   Socioeconomic History  . Marital status: Single    Spouse name: Not on file  . Number of children: Not on file  . Years of education: Not on file  . Highest education level: Not on file  Social Needs  . Financial resource strain: Not on file  . Food insecurity - worry: Not on file  . Food insecurity - inability: Not on file  . Transportation needs - medical: Not on file  . Transportation needs - non-medical: Not on file  Occupational History  . Not on file  Tobacco Use  . Smoking status: Current Some Day Smoker    Types: Cigars  Substance and Sexual Activity  . Alcohol use: Yes    Comment: occ  . Drug use: Yes    Types: Marijuana    Comment: occ  . Sexual activity: Not on file  Other Topics Concern  . Not on file  Social History Narrative  . Not on file    FAMILY HISTORY: History reviewed. No pertinent family  history.  ALLERGIES:  has No Known Allergies.  MEDICATIONS:  Current Facility-Administered Medications  Medication Dose Route Frequency Provider Last Rate Last Dose  . 0.9 %  sodium chloride infusion   Intravenous Continuous Nita Sells, MD 100 mL/hr at 09/13/17 0536    . enoxaparin (LOVENOX) injection 40 mg  40 mg Subcutaneous Q24H Nita Sells, MD   40 mg at 09/12/17 2104  . morphine 4 MG/ML injection 1-2 mg  1-2 mg Intravenous Q2H PRN Nita Sells, MD   2 mg at 09/12/17 1921  . oxyCODONE (Oxy IR/ROXICODONE) immediate release tablet 5 mg  5 mg Oral Q3H PRN Nita Sells, MD   5 mg at 09/13/17 1411  . sodium chloride flush (NS) 0.9 % injection 3 mL  3 mL Intravenous Q12H Nita Sells, MD        REVIEW OF SYSTEMS:   Constitutional: Denies fevers, chills or abnormal weight loss Eyes: Denies blurriness of vision Ears, nose, mouth, throat, and face: Denies mucositis or sore throat Respiratory: Denies cough, dyspnea or wheezes Cardiovascular: Denies palpitation, chest discomfort or lower extremity swelling Gastrointestinal:  Denies nausea, constipation, diarrhea GU: Denies dysuria or incontinence Skin: Denies abnormal skin rashes Neurological: Per HPI Musculoskeletal: ++back pain Behavioral/Psych: Denies anxiety, disturbance in thought content, and mood instability   PHYSICAL EXAMINATION: Vitals:   09/13/17 0605 09/13/17 1410  BP: 130/78 108/62  Pulse: 62 60  Resp: 17 16  Temp: 98.9 F (37.2 C) 98.6 F (37 C)  SpO2: 99% 98%   KPS: 70. General: Alert, cooperative, pleasant, in no acute distress Head: Normal EENT: No conjunctival injection or scleral icterus. Oral mucosa moist Lungs: Resp effort normal Cardiac: Regular rate and rhythm Abdomen: Soft, non-distended abdomen Skin: No rashes cyanosis or petechiae. Extremities: No clubbing or edema  NEUROLOGIC EXAM: Mental Status: Awake, alert, attentive to examiner. Oriented to self and  environment. Language is fluent with intact comprehension.  Cranial Nerves: Visual acuity is grossly normal. Visual fields are full. Extra-ocular movements intact. No ptosis. Face is symmetric, tongue midline. Motor: Tone and bulk are normal. Power is full in both arms and legs. Reflexes are symmetric, no pathologic reflexes present. Intact finger to nose bilaterally Sensory: Intact to light touch and temperature Gait: Deferred  LABORATORY DATA:  I have reviewed the data as listed Lab Results  Component Value Date   WBC 10.7 (H) 09/13/2017   HGB 15.3 09/13/2017   HCT 43.3 09/13/2017   MCV 82.5 09/13/2017   PLT 270 09/13/2017   Recent Labs    07/26/17 1054 09/12/17 1555 09/13/17 0622  NA 140 139 136  K 4.5 4.1 4.1  CL 108 103 103  CO2 23 26 24   GLUCOSE 98 102* 101*  BUN 17 23* 18  CREATININE 1.23 1.51* 1.32*  CALCIUM 9.2 9.1 8.7*  GFRNONAA >60 50* 59*  GFRAA >60 58* >60  PROT 7.3 6.9 6.6  ALBUMIN 3.5 3.5 3.1*  AST 20 28 19   ALT 12* 23 18  ALKPHOS 99 106 98  BILITOT 0.7 1.2 0.8    RADIOGRAPHIC STUDIES: I have personally reviewed the radiological images as listed and agreed with the findings in the report.  Dg Chest 2 View  Result Date: 09/12/2017 CLINICAL DATA:  CT today revealing lytic lesion in the right T12 vertebral body. Pt has been feeling unwell and suffering from back and side pain since January. EXAM: CHEST - 2 VIEW COMPARISON:  CT abdomen from earlier same day. No previous chest x-ray. FINDINGS: Heart size and mediastinal contours are normal. Lungs are clear. No pleural effusion seen. No acute or suspicious osseous findings. Lytic changes at the T12 vertebral body level are better demonstrated on earlier CT IMPRESSION: No active cardiopulmonary disease. No evidence of primary intrathoracic malignancy identified. Electronically Signed   By: Franki Cabot M.D.   On: 09/12/2017 15:20   Mr Thoracic Spine W Wo Contrast  Result Date: 09/13/2017 CLINICAL DATA:  Lytic  mass in the T12 vertebra with pathologic fracture. Back and right flank pain. EXAM: MRI THORACIC WITHOUT AND WITH CONTRAST TECHNIQUE: Multiplanar and multiecho pulse sequences of the thoracic spine were obtained without and with intravenous contrast. CONTRAST:  51mL MULTIHANCE GADOBENATE DIMEGLUMINE 529 MG/ML IV SOLN COMPARISON:  Chest x-ray dated 09/12/2017 and CT scan of the abdomen and pelvis dated 09/12/2017 FINDINGS: MRI THORACIC SPINE FINDINGS Alignment:  Physiologic. Vertebrae: There are small metastatic lesions in multiple vertebral bodies including C7, T5, T6, T8, T9, T10, T11 and L1. There is a larger lesion involving the majority of the T12 vertebra extending into the right pedicle and facets with a pathologic fracture of the posterior aspect of the right side of the vertebral body. Tumor extends into the epidural space anteriorly to the right and left of midline and into the right neural foramina at T11-12 and T12-L1. There is compression of the thecal sac. Distal spinal cord is slightly compressed without myelopathy. Tip of the conus is  at L1. Cord: The thoracic spinal cord appears normal except for the mass effect of the epidural tumor at T12. No myelopathy. Paraspinal and other soft tissues: There is slight extension of tumor into the paraspinal soft tissues to the right of midline at T12 best seen on images 19 of series 11 and series 15. Disc levels: There are no disc protrusions or disc bulges. IMPRESSION: 1. Numerous lesions throughout the thoracic spine as well as in C7, consistent with metastatic disease. 2. Extensive tumor in the T12 vertebra with epidural extension slightly compressing the thecal sac. Minimal right paraspinal soft tissue extension at T12. Pathologic fracture of T12. Electronically Signed   By: Lorriane Shire M.D.   On: 09/13/2017 10:19   Ct Renal Stone Study  Result Date: 09/12/2017 CLINICAL DATA:  Right flank pain for 1 month. EXAM: CT ABDOMEN AND PELVIS WITHOUT CONTRAST  TECHNIQUE: Multidetector CT imaging of the abdomen and pelvis was performed following the standard protocol without IV contrast. COMPARISON:  None. FINDINGS: Lower chest: Tiny calcified nodule in the left lower lobe. Minimal bibasilar scarring. No pleural effusion. Normal heart size. Hepatobiliary: Innumerable low-density lesions throughout the liver measuring up to 6 cm in size and with attenuation suggestive of cysts. Hepatic enlargement, with the right lobe measuring 20 cm in craniocaudal length. Underdistended gallbladder containing intermediate density material, possibly concentrated bile/sludge. No gross gallbladder wall thickening or acute pericholecystic inflammatory changes. No biliary dilatation. Pancreas: Unremarkable. Spleen: Unremarkable. Adrenals/Urinary Tract: Unremarkable adrenal glands. Subcentimeter low-density lesion in the left kidney, too small to fully characterize. No renal calculi or hydronephrosis. Unremarkable bladder. Stomach/Bowel: The stomach is within normal limits. There is no evidence of bowel obstruction or inflammation. The appendix is unremarkable. Vascular/Lymphatic: Normal caliber of the abdominal aorta. Borderline to mildly enlarged lower left para-aortic lymph node measuring 10 mm in short axis. Enlarged left common iliac lymph node measures 1.6 cm. Enlarged external iliac lymph nodes measure 1.6 cm on the right and 2.8 and 2.2 cm on the left. Reproductive: Mild enlargement of the prostate gland. Other: No intraperitoneal free fluid.  No abdominal wall hernia. Musculoskeletal: 3.3 cm predominantly lytic lesion in the right T12 vertebral body and posterior elements with right-sided vertebral compression fracture demonstrating 35% height loss. There is focal disruption of the posterior T12 vertebral body cortex and slight right-sided vertebral body retropulsion with possible small volume epidural tumor and likely mild spinal stenosis. 5 mm lytic lesion posteriorly in the T11  vertebral body. 6 mm lytic lesion in the left L4 inferior articular process. IMPRESSION: 1. Multiple lytic lesions in the spine and bilateral pelvic lymphadenopathy concerning for metastatic disease. 2. 3.3 cm lesion at T12 with pathologic compression fracture and possible small volume epidural tumor. Consider contrast-enhanced thoracic spine MRI for further evaluation. 3. Enlarged liver containing innumerable low-density lesions. Considerations include metastases and benign polycystic liver disease. Contrast-enhanced abdominal MRI is recommended for further evaluation. Electronically Signed   By: Logan Bores M.D.   On: 09/12/2017 14:43    ASSESSMENT & PLAN:   T12 Lytic Tumor with Epidural Extension  Mr. Doughtie tumor is likely a metastatic lesion from an underlying malignancy.  This will be definitively evaluated tomorrow with formal tissue analysis.  This was discussed in detail with the patient and his family at bedside.  In the meantime, the case will be reviewed in multidisciplinary tumor board on 09/21/17.   Continue analgesia and physical therapy.    Will follow.  All questions were answered. The patient knows to  call the clinic with any problems, questions or concerns.  The total time spent in the encounter was 40 minutes and more than 50% was on counseling and review of test results     Ventura Sellers, MD 09/13/2017 4:40 PM

## 2017-09-13 NOTE — Progress Notes (Signed)
Hospitalist progress note   Malin Cervini  WJX:[id number] DOB: 09-Feb-1961 DOA: 09/12/2017 PCP: Patient, No Pcp Per   Specialists:    Brief Narrative:  57 year old male no significant past medical history seen 06/2017 in the emergency room for chest pain flank pain given steroid Dosepak as well as pain medication and had no significant improvement he had a repeat episode and came to emergency room to get checked out  At first visit when he came to the emergency room labs were normal although he had a mildly elevated BUN/creatinine 17/1.2  Found to have possible metastatic cancer base don new lesions in [place] and spine NS consutled Ir consuklted for biopsy  Assessment & Plan:   Assessment:  The encounter diagnosis was Cancer [facility]).   Probable metastatic cancer with unknown primary-t prognosis is not to good if he has metastatic disease-he will need multidisciplinary follow-up as an outpatient IR seeing for biopsy to determine primary which will be later today D/w Dr. Alvy Bimler who recommends CT chest c contrast and coordination with Dr. Mickeal Skinner of Neuro-oncology who has been consulted for further work-up and possible coordination with XRT For his pain now place him on oxycodone with morphine as a follow-up for severe pain  Acute kidney injury secondary probably to nonsteroidal meds we will ask him to use IV saline 100 cc an hour for the next 24 hou This has resolved nicely and will continue IVf for another 254 hours-labs am  Leukocytosis potentially secondary to use of steroids recently versus unknown primary   DVT prophylaxis: lovenox  Code Status:   full   Family Communication:    None  Disposition Plan: ip   Consultants:   Oncology  IR  Procedures:   none  Antimicrobials:   none   Subjective:  awake tearful and scared   Objective: Vitals:   09/12/17 1900 09/12/17 1915 09/12/17 2025 09/13/17 0605  BP: (!) 148/82 (!) 131/92 127/75 130/78  Pulse: 63 61 61 62  Resp:    16 17  Temp:   98.9 F (37.2 C) 98.9 F (37.2 C)  TempSrc:   Oral Oral  SpO2: 95% 96% 99% 99%  Weight:   75.8 kg (167 lb)   Height:   5\' 9"  (1.753 m)     Intake/Output Summary (Last 24 hours) at 09/13/2017 1037 Last data filed at 09/13/2017 0644 Gross per 24 hour  Intake 1138.33 ml  Output 700 ml  Net 438.33 ml   Filed Weights   09/12/17 1211 09/12/17 2025  Weight: 74.8 kg (165 lb) 75.8 kg (167 lb)    Examination:  eomi ncat No ict abd soft Moving 4 limbs equally In nad    Data Reviewed: I have personally reviewed following labs and imaging studies  CBC: Recent Labs  Lab 09/12/17 1555 09/13/17 0622  WBC 14.9* 10.7*  NEUTROABS 10.4*  --   HGB 16.2 15.3  HCT 45.5 43.3  MCV 81.8 82.5  PLT 297 213   Basic Metabolic Panel: Recent Labs  Lab 09/12/17 1555 09/13/17 0622  NA 139 136  K 4.1 4.1  CL 103 103  CO2 26 24  GLUCOSE 102* 101*  BUN 23* 18  CREATININE 1.51* 1.32*  CALCIUM 9.1 8.7*   GFR: Estimated Creatinine Clearance: 62.5 mL/min (A) (by C-G formula based on SCr of 1.32 mg/dL (H)). Liver Function Tests: Recent Labs  Lab 09/12/17 1555 09/13/17 0622  AST 28 19  ALT 23 18  ALKPHOS 106 98  BILITOT 1.2 0.8  PROT  6.9 6.6  ALBUMIN 3.5 3.1*   No results for input(s): LIPASE, AMYLASE in the last 168 hours. No results for input(s): AMMONIA in the last 168 hours. Coagulation Profile: No results for input(s): INR, PROTIME in the last 168 hours. Cardiac Enzymes: No results for input(s): CKTOTAL, CKMB, CKMBINDEX, TROPONINI in the last 168 hours. CBG: No results for input(s): GLUCAP in the last 168 hours. Urine analysis:    Component Value Date/Time   COLORURINE STRAW (A) 07/26/2017 1130   APPEARANCEUR CLEAR 07/26/2017 1130   LABSPEC 1.013 07/26/2017 1130   PHURINE 5.0 07/26/2017 1130   GLUCOSEU NEGATIVE 07/26/2017 1130   HGBUR NEGATIVE 07/26/2017 1130   BILIRUBINUR NEGATIVE 07/26/2017 1130   KETONESUR NEGATIVE 07/26/2017 1130   PROTEINUR  NEGATIVE 07/26/2017 1130   NITRITE NEGATIVE 07/26/2017 [place] 07/26/2017 1130     Radiology Studies: Reviewed images personally in health database    Scheduled Meds: . enoxaparin (LOVENOX) injection  40 mg Subcutaneous Q24H  . sodium chloride flush  3 mL Intravenous Q12H   Continuous Infusions: . sodium chloride 100 mL/hr at 09/13/17 0536     LOS: 1 day    Time spent: [place], MD [facility] (P) [phone]   If 7PM-7AM, please contact night-coverage [email or web address] Password [facility] 09/13/2017, 10:37 AM

## 2017-09-14 ENCOUNTER — Inpatient Hospital Stay (HOSPITAL_COMMUNITY): Payer: Medicaid Other

## 2017-09-14 LAB — PROTIME-INR
INR: 0.96
Prothrombin Time: 12.7 seconds (ref 11.4–15.2)

## 2017-09-14 MED ORDER — FENTANYL CITRATE (PF) 100 MCG/2ML IJ SOLN
INTRAMUSCULAR | Status: AC | PRN
  Administered 2017-09-14: 25 ug via INTRAVENOUS
  Administered 2017-09-14: 50 ug via INTRAVENOUS
  Administered 2017-09-14: 25 ug via INTRAVENOUS

## 2017-09-14 MED ORDER — FENTANYL CITRATE (PF) 100 MCG/2ML IJ SOLN
INTRAMUSCULAR | Status: AC
  Filled 2017-09-14: qty 4

## 2017-09-14 MED ORDER — LIDOCAINE HCL 1 % IJ SOLN
INTRAMUSCULAR | Status: AC
  Filled 2017-09-14: qty 20

## 2017-09-14 MED ORDER — MIDAZOLAM HCL 2 MG/2ML IJ SOLN
INTRAMUSCULAR | Status: AC | PRN
  Administered 2017-09-14 (×2): 0.5 mg via INTRAVENOUS
  Administered 2017-09-14: 1 mg via INTRAVENOUS

## 2017-09-14 MED ORDER — DEXAMETHASONE SODIUM PHOSPHATE 10 MG/ML IJ SOLN
4.0000 mg | Freq: Two times a day (BID) | INTRAMUSCULAR | Status: DC
  Administered 2017-09-14 – 2017-09-19 (×11): 4 mg via INTRAVENOUS
  Filled 2017-09-14 (×11): qty 1

## 2017-09-14 MED ORDER — MIDAZOLAM HCL 2 MG/2ML IJ SOLN
INTRAMUSCULAR | Status: AC
  Filled 2017-09-14: qty 4

## 2017-09-14 NOTE — Progress Notes (Addendum)
1215 Pt to IR for biopsy.  1500 Received pt from IR, A&O x4.  Bandaid to left groin dry and intact. Pt denies pain. 1600 Bandaid to left groin dry and intact.

## 2017-09-14 NOTE — Progress Notes (Addendum)
Patient ID: Jonathan Campbell, male   DOB: 04-Feb-1961, 57 y.o.   MRN: [id number] BP 134/78 (BP Location: Right Arm)   Pulse (!) 56   Temp 98.3 F (36.8 C) (Oral)   Resp 13   Ht 5\' 9"  (1.753 m)   Wt 75.8 kg (167 lb)   SpO2 100%   BMI 24.66 kg/m  Alert and oriented x 4, speech is clear, and fluent Moving all extremities well Biopsy today, awaiting results Though there is epidural tumor, the stenosis is not critical. Hardware unlikely given the extensive infiltration of the thoracic vertebrae.

## 2017-09-14 NOTE — Progress Notes (Signed)
PROGRESS NOTE    Cosmo Tetreault  GQQ:[id number] DOB: 1960-12-31 DOA: 09/12/2017 PCP: Patient, No Pcp Per  Brief Narrative: 57 year old male with no past medical history presented to the emergency room 3/18 with low back pain/flank pain. 6 CT abdomen pelvis noted lytic lesions throughout the thoracic spine, bilateral pelvic adenopathy, T12 pathological compression fracture and small epidural tumor, liver lesions concerning for metastatic disease, with unknown primary  Assessment & Plan:   Metastatic cancer with unknown primary -CT and MRI shows evidence of diffuse osseous metastasis of thoracic spine/T12 lytic tumor with epidural extension, bilateral pelvic adenopathy and liver metastasis -Appreciate consult from neurosurgery and neuro-oncology -Add low-dose Decadron for symptom control -plan for pelvic lymph node biopsy by interventional radiology today -will consult radiation oncology tomorrow  Leukocytosis -Mild, likely reactive, monitor  Renal insufficiency -baseline creatinine was 1.2 in January, now 1.3 -Stable, monitor  DVT prophylaxis:Lovenox Code Status: full code Family Communication:no family at bedside Disposition Plan: home pending diagnostic workup  Consultants:   Neurosurgery  Neurooncology   Procedures:   Antimicrobials:    Subjective: -continues to have moderate low back discomfort  Objective: Vitals:   09/13/17 1410 09/13/17 2205 09/14/17 0520 09/14/17 1359  BP: 108/62 128/79 121/70 (!) 153/86  Pulse: 60 72 (!) 55 (!) 58  Resp: _0 Temp: 98.6 F (37 C) 99.2 F (37.3 C) 97.8 F (36.6 C)   TempSrc: Oral Oral Oral   SpO2: 98% 94% 98% 99%  Weight:      Height:        Intake/Output Summary (Last 24 hours) at 09/14/2017 1408 Last data filed at 09/14/2017 1000 Gross per 24 hour  Intake 240 ml  Output 2465 ml  Net -2225 ml   Filed Weights   09/12/17 1211 09/12/17 2025  Weight: 74.8 kg (165 lb) 75.8 kg (167 lb)     Examination:  General exam: Appears calm and comfortable  Respiratory system: Clear to auscultation. Respiratory effort normal. Cardiovascular system: S1 & S2 heard, RRR.   Gastrointestinal system: Abdomen is nondistended, soft and nontender.Normal bowel sounds heard. Central nervous system: Alert and oriented. No focal neurological deficits. Extremities: strength is 4+/5 in both lower extremities, sensations intact Skin: No rashes, lesions or ulcers Psychiatry: Judgement and insight appear normal. Mood & affect appropriate.     Data Reviewed:   CBC: Recent Labs  Lab 09/12/17 1555 09/13/17 0622  WBC 14.9* 10.7*  NEUTROABS 10.4*  --   HGB 16.2 15.3  HCT 45.5 43.3  MCV 81.8 82.5  PLT 297 671   Basic Metabolic Panel: Recent Labs  Lab 09/12/17 1555 09/13/17 0622  NA 139 136  K 4.1 4.1  CL 103 103  CO2 26 24  GLUCOSE 102* 101*  BUN 23* 18  CREATININE 1.51* 1.32*  CALCIUM 9.1 8.7*   GFR: Estimated Creatinine Clearance: 62.5 mL/min (A) (by C-G formula based on SCr of 1.32 mg/dL (H)). Liver Function Tests: Recent Labs  Lab 09/12/17 1555 09/13/17 0622  AST 28 19  ALT 23 18  ALKPHOS 106 98  BILITOT 1.2 0.8  PROT 6.9 6.6  ALBUMIN 3.5 3.1*   No results for input(s): LIPASE, AMYLASE in the last 168 hours. No results for input(s): AMMONIA in the last 168 hours. Coagulation Profile: Recent Labs  Lab 09/14/17 1230  INR 0.96   Cardiac Enzymes: No results for input(s): CKTOTAL, CKMB, CKMBINDEX, TROPONINI in the last 168 hours. BNP (last 3 results) No results for input(s): PROBNP in the last  8760 hours. HbA1C: No results for input(s): HGBA1C in the last 72 hours. CBG: No results for input(s): GLUCAP in the last 168 hours. Lipid Profile: No results for input(s): CHOL, HDL, LDLCALC, TRIG, CHOLHDL, LDLDIRECT in the last 72 hours. Thyroid Function Tests: No results for input(s): TSH, T4TOTAL, FREET4, T3FREE, THYROIDAB in the last 72 hours. Anemia Panel: No  results for input(s): VITAMINB12, FOLATE, FERRITIN, TIBC, IRON, RETICCTPCT in the last 72 hours. Urine analysis:    Component Value Date/Time   COLORURINE STRAW (A) 07/26/2017 1130   APPEARANCEUR CLEAR 07/26/2017 1130   LABSPEC 1.013 07/26/2017 1130   PHURINE 5.0 07/26/2017 1130   GLUCOSEU NEGATIVE 07/26/2017 1130   HGBUR NEGATIVE 07/26/2017 1130   BILIRUBINUR NEGATIVE 07/26/2017 1130   KETONESUR NEGATIVE 07/26/2017 1130   PROTEINUR NEGATIVE 07/26/2017 1130   NITRITE NEGATIVE 07/26/2017 1130   LEUKOCYTESUR NEGATIVE 07/26/2017 1130   Sepsis Labs: _0 (procalcitonin:4,lacticidven:4)  )No results found for this or any previous visit (from the past 240 hour(s)).       Radiology Studies: Dg Chest 2 View  Result Date: 09/12/2017 CLINICAL DATA:  CT today revealing lytic lesion in the right T12 vertebral body. Pt has been feeling unwell and suffering from back and side pain since January. EXAM: CHEST - 2 VIEW COMPARISON:  CT abdomen from earlier same day. No previous chest x-ray. FINDINGS: Heart size and mediastinal contours are normal. Lungs are clear. No pleural effusion seen. No acute or suspicious osseous findings. Lytic changes at the T12 vertebral body level are better demonstrated on earlier CT IMPRESSION: No active cardiopulmonary disease. No evidence of primary intrathoracic malignancy identified. Electronically Signed   By: Franki Cabot M.D.   On: 09/12/2017 15:20   Ct Chest W Contrast  Result Date: 09/13/2017 CLINICAL DATA:  Cancer screen, Cowden Syndrome/PHTS criteria, PTEN positive or familial mutation known BUN 18 Creat 1.32 GFR >60 EXAM: CT CHEST WITH CONTRAST TECHNIQUE: Multidetector CT imaging of the chest was performed during intravenous contrast administration. CONTRAST:  31m ISOVUE-300 IOPAMIDOL (ISOVUE-300) INJECTION 61% COMPARISON:  09/12/2017 CHEST X-RAY FINDINGS: Cardiovascular: Heart size normal. Normal appearance of the thoracic aorta and pulmonary arteries  accounting for the contrast bolus timing. Mediastinum/Nodes: The visualized portion of the thyroid gland has a normal appearance. No significant mediastinal, hilar, or axillary adenopathy. Esophagus is normal in appearance. Lungs/Pleura: There is a small calcified nodule in the LEFT LOWER lobe. No suspicious pulmonary nodules. There is no pleural effusion or consolidation. There is minimal scarring or atelectasis in the RIGHT LOWER lobe. Airways are patent. Upper Abdomen: Numerous low-attenuation lesions are again identified throughout the liver. The liver is enlarged and only partially imaged. Musculoskeletal: Numerous lytic lesions again identified throughout the thoracic spine, with extensive involvement of T12. There is a pathologic fracture with retropulsion, narrowing the thecal sac at T12. Associated soft tissue mass extends into the RIGHT intervertebral foramen at T12-L1. Smaller lytic lesions are identified in T11, T9, and C7. Recent MRI demonstrated additional lesions. IMPRESSION: 1. No pulmonary nodules or thoracic adenopathy. 2. Known lytic lesions involving the vertebral bodies. 3. Pathologic fracture with associated retropulsion and soft tissue mass at T12. 4. Numerous liver lesions and hepatomegaly. Electronically Signed   By: ENolon NationsM.D.   On: 09/13/2017 17:33   Mr Thoracic Spine W Wo Contrast  Result Date: 09/13/2017 CLINICAL DATA:  Lytic mass in the T12 vertebra with pathologic fracture. Back and right flank pain. EXAM: MRI THORACIC WITHOUT AND WITH CONTRAST TECHNIQUE: Multiplanar and multiecho pulse sequences  of the thoracic spine were obtained without and with intravenous contrast. CONTRAST:  87m MULTIHANCE GADOBENATE DIMEGLUMINE 529 MG/ML IV SOLN COMPARISON:  Chest x-ray dated 09/12/2017 and CT scan of the abdomen and pelvis dated 09/12/2017 FINDINGS: MRI THORACIC SPINE FINDINGS Alignment:  Physiologic. Vertebrae: There are small metastatic lesions in multiple vertebral bodies  including C7, T5, T6, T8, T9, T10, T11 and L1. There is a larger lesion involving the majority of the T12 vertebra extending into the right pedicle and facets with a pathologic fracture of the posterior aspect of the right side of the vertebral body. Tumor extends into the epidural space anteriorly to the right and left of midline and into the right neural foramina at T11-12 and T12-L1. There is compression of the thecal sac. Distal spinal cord is slightly compressed without myelopathy. Tip of the conus is at L1. Cord: The thoracic spinal cord appears normal except for the mass effect of the epidural tumor at T12. No myelopathy. Paraspinal and other soft tissues: There is slight extension of tumor into the paraspinal soft tissues to the right of midline at T12 best seen on images 19 of series 11 and series 15. Disc levels: There are no disc protrusions or disc bulges. IMPRESSION: 1. Numerous lesions throughout the thoracic spine as well as in C7, consistent with metastatic disease. 2. Extensive tumor in the T12 vertebra with epidural extension slightly compressing the thecal sac. Minimal right paraspinal soft tissue extension at T12. Pathologic fracture of T12. Electronically Signed   By: JLorriane ShireM.D.   On: 09/13/2017 10:19   Ct Renal Stone Study  Result Date: 09/12/2017 CLINICAL DATA:  Right flank pain for 1 month. EXAM: CT ABDOMEN AND PELVIS WITHOUT CONTRAST TECHNIQUE: Multidetector CT imaging of the abdomen and pelvis was performed following the standard protocol without IV contrast. COMPARISON:  None. FINDINGS: Lower chest: Tiny calcified nodule in the left lower lobe. Minimal bibasilar scarring. No pleural effusion. Normal heart size. Hepatobiliary: Innumerable low-density lesions throughout the liver measuring up to 6 cm in size and with attenuation suggestive of cysts. Hepatic enlargement, with the right lobe measuring 20 cm in craniocaudal length. Underdistended gallbladder containing intermediate  density material, possibly concentrated bile/sludge. No gross gallbladder wall thickening or acute pericholecystic inflammatory changes. No biliary dilatation. Pancreas: Unremarkable. Spleen: Unremarkable. Adrenals/Urinary Tract: Unremarkable adrenal glands. Subcentimeter low-density lesion in the left kidney, too small to fully characterize. No renal calculi or hydronephrosis. Unremarkable bladder. Stomach/Bowel: The stomach is within normal limits. There is no evidence of bowel obstruction or inflammation. The appendix is unremarkable. Vascular/Lymphatic: Normal caliber of the abdominal aorta. Borderline to mildly enlarged lower left para-aortic lymph node measuring 10 mm in short axis. Enlarged left common iliac lymph node measures 1.6 cm. Enlarged external iliac lymph nodes measure 1.6 cm on the right and 2.8 and 2.2 cm on the left. Reproductive: Mild enlargement of the prostate gland. Other: No intraperitoneal free fluid.  No abdominal wall hernia. Musculoskeletal: 3.3 cm predominantly lytic lesion in the right T12 vertebral body and posterior elements with right-sided vertebral compression fracture demonstrating 35% height loss. There is focal disruption of the posterior T12 vertebral body cortex and slight right-sided vertebral body retropulsion with possible small volume epidural tumor and likely mild spinal stenosis. 5 mm lytic lesion posteriorly in the T11 vertebral body. 6 mm lytic lesion in the left L4 inferior articular process. IMPRESSION: 1. Multiple lytic lesions in the spine and bilateral pelvic lymphadenopathy concerning for metastatic disease. 2. 3.3 cm lesion at  T12 with pathologic compression fracture and possible small volume epidural tumor. Consider contrast-enhanced thoracic spine MRI for further evaluation. 3. Enlarged liver containing innumerable low-density lesions. Considerations include metastases and benign polycystic liver disease. Contrast-enhanced abdominal MRI is recommended for  further evaluation. Electronically Signed   By: Logan Bores M.D.   On: 09/12/2017 14:43        Scheduled Meds: . enoxaparin (LOVENOX) injection  40 mg Subcutaneous Q24H  . fentaNYL      . lidocaine      . midazolam      . sodium chloride flush  3 mL Intravenous Q12H   Continuous Infusions: . sodium chloride 50 mL/hr at 09/14/17 1051     LOS: 2 days    Time spent: 19mn    PDomenic Polite MD [facility] Page via [email or web address], password [email or web address] After 7PM please contact night-coverage  09/14/2017, 2:08 PM

## 2017-09-14 NOTE — Procedures (Signed)
CT guided core biopsies of left pelvic lymph node.  4 cores obtained and placed in saline.  Minimal blood loss and no immediate complication.

## 2017-09-15 ENCOUNTER — Other Ambulatory Visit: Payer: Self-pay

## 2017-09-15 ENCOUNTER — Ambulatory Visit
Admit: 2017-09-15 | Discharge: 2017-09-15 | Disposition: A | Discharge status: Home / Self Care | Payer: Self-pay | Source: Ambulatory Visit | Attending: Radiation Oncology | Admitting: Radiation Oncology

## 2017-09-15 ENCOUNTER — Encounter (HOSPITAL_COMMUNITY): Payer: Self-pay | Admitting: General Practice

## 2017-09-15 DIAGNOSIS — C7989 Secondary malignant neoplasm of other specified sites: Secondary | ICD-10-CM | POA: Insufficient documentation

## 2017-09-15 DIAGNOSIS — C801 Malignant (primary) neoplasm, unspecified: Principal | ICD-10-CM

## 2017-09-15 LAB — COMPREHENSIVE METABOLIC PANEL
ALT: 17 U/L (ref 17–63)
AST: 19 U/L (ref 15–41)
Albumin: 3.3 g/dL — ABNORMAL LOW (ref 3.5–5.0)
Alkaline Phosphatase: 108 U/L (ref 38–126)
Anion gap: 10 (ref 5–15)
BUN: 19 mg/dL (ref 6–20)
CO2: 20 mmol/L — ABNORMAL LOW (ref 22–32)
Calcium: 9 mg/dL (ref 8.9–10.3)
Chloride: 105 mmol/L (ref 101–111)
Creatinine, Ser: 1.06 mg/dL (ref 0.61–1.24)
GFR calc Af Amer: >60 mL/min (ref >60)
GFR calc non Af Amer: >60 mL/min (ref >60)
Glucose, Bld: 117 mg/dL — ABNORMAL HIGH (ref 65–99)
Potassium: 4.4 mmol/L (ref 3.5–5.1)
Sodium: 135 mmol/L (ref 135–145)
Total Bilirubin: 1.1 mg/dL (ref 0.3–1.2)
Total Protein: 7.2 g/dL (ref 6.5–8.1)

## 2017-09-15 MED ORDER — POLYETHYLENE GLYCOL 3350 17 G PO PACK
17.0000 g | PACK | Freq: Every day | ORAL | Status: DC
  Administered 2017-09-17: 17 g via ORAL
  Filled 2017-09-15 (×3): qty 1

## 2017-09-15 MED ORDER — SENNOSIDES-DOCUSATE SODIUM 8.6-50 MG PO TABS
1.0000 | ORAL_TABLET | Freq: Two times a day (BID) | ORAL | Status: DC
  Administered 2017-09-15 – 2017-09-19 (×8): 1 via ORAL
  Filled 2017-09-15 (×8): qty 1

## 2017-09-15 NOTE — Progress Notes (Signed)
Patient ID: Jonathan Campbell, male   DOB: 1961/03/27, 57 y.o.   MRN: [id number] BP (!) 145/66 (BP Location: Left Arm)   Pulse (!) 58   Temp 99 F (37.2 C) (Oral)   Resp 14   Ht 5\' 9"  (1.753 m)   Wt 75.8 kg (167 lb)   SpO2 99%   BMI 24.66 kg/m  Alert and oriented x 4. The pain Jonathan Campbell has is undoubtedly due to the fracture and bony lesions. No real role for decadron in terms of pain control. If in anticipation of spinal irradiation can understand prepping the spinal cord. But the spinal cord is not the cause of his pain at this time. Maintains a normal neurological exam.  Will continue to follow.

## 2017-09-15 NOTE — Progress Notes (Signed)
PT Cancellation Note  Patient Details Name: Jonathan Campbell MRN: [id number] DOB: 07-07-1960   Cancelled Treatment:    Reason Eval/Treat Not Completed: Patient not medically ready. Orders received, chart reviewed. Per Dr. Lacy Duverney note, patient will need TLSO. Will await brace arrival to mobilize patient.   Ellamae Sia, PT, DPT Acute Rehabilitation Services  Pager: [place] 09/15/2017, 2:29 PM

## 2017-09-15 NOTE — Consult Note (Signed)
Radiation Oncology         ([phone]) [phone] ________________________________  Initial inpatient Consultation  Name: Jonathan Campbell MRN: [id number]  Date of Service: 09/12/2017 DOB: Aug 05, 1960  BZ:JIRCVEL, No Pcp Per  No ref. provider found   REFERRING PHYSICIAN: No ref. provider found  DIAGNOSIS: The encounter diagnosis was Cancer [facility]).    ICD-10-CM   1. Cancer ([place]) C80.1 CT BIOPSY    CT BIOPSY    CANCELED: IR Radiologist Eval & Mgmt    CANCELED: IR Radiologist Eval & Mgmt    HISTORY OF PRESENT ILLNESS: Jonathan Campbell is a 57 y.o. male seen at the request of Dr. Domenic Polite.  He presented to the emergency department on 09/12/2017 with complaints of low back and right flank pain ongoing for the past 6-8 weeks.  A CT renal study protocol was performed as part of his initial workup and revealed multiple lytic lesions in the spine and bilateral pelvic lymphadenopathy concerning for metastatic disease.  There was a 3.3 cm lesion at T12 with pathologic compression fracture and possible small volume epidural extension.  Additionally, there was hepatic enlargement with innumerable low-density lesions noted throughout, measuring up to 6 cm in size and with attenuation suggestive of cysts.   An MRI of the thoracic spine was performed on 09/13/2017 confirming small metastatic lesions in multiple vertebral bodies including C7, T5, T6, T8, T9, T10, T11 and L1.  There is a larger lesion involving the majority of the T12 vertebra and extending into the right pedicle and facets with a pathologic fracture of the posterior aspect of the right side of the vertebral body. There is tumoral extension into the epidural space anteriorly to the right and left of midline and into the right neural foramina at T11-T12 and T12-L1 with compression of the thecal sac.  The distal thoracic spinal cord is slightly compressed without myelopathy and appears normal otherwise.  He has been started on IV Decadron 22m BID as well as  Morphine and Oxycodone for pain control with only mild, temporary relief. He denies paraesthesias or focal weakness in the extremities.  He has intermittent pain and numbness/tingling in the right flank region but this does not radiate into the lower extremities.  He denies any bladder or bowel incontinence.  A CT chest was performed for disease staging and was negative for suspicious pulmonary nodules or thoracic adenopathy.    He underwent CT guided core needle biopsy of a left pelvic lymph node 09/14/17- final pathology remains pending at this time.  We are asked to consult to discuss the potential role of palliative radiotherapy for improved pain control.  PREVIOUS RADIATION THERAPY: No  PAST MEDICAL HISTORY: History reviewed. No pertinent past medical history.    PAST SURGICAL HISTORY:History reviewed. No pertinent surgical history.  FAMILY HISTORY: History reviewed. No pertinent family history.  SOCIAL HISTORY:  Social History   Socioeconomic History  . Marital status: Single    Spouse name: Not on file  . Number of children: Not on file  . Years of education: Not on file  . Highest education level: Not on file  Occupational History  . Not on file  Social Needs  . Financial resource strain: Not on file  . Food insecurity:    Worry: Not on file    Inability: Not on file  . Transportation needs:    Medical: Not on file    Non-medical: Not on file  Tobacco Use  . Smoking status: Current Some Day Smoker  Types: Cigars  Substance and Sexual Activity  . Alcohol use: Yes    Comment: occ  . Drug use: Yes    Types: Marijuana    Comment: occ  . Sexual activity: Not on file  Lifestyle  . Physical activity:    Days per week: Not on file    Minutes per session: Not on file  . Stress: Not on file  Relationships  . Social connections:    Talks on phone: Not on file    Gets together: Not on file    Attends religious service: Not on file    Active member of club or  organization: Not on file    Attends meetings of clubs or organizations: Not on file    Relationship status: Not on file  . Intimate partner violence:    Fear of current or ex partner: Not on file    Emotionally abused: Not on file    Physically abused: Not on file    Forced sexual activity: Not on file  Other Topics Concern  . Not on file  Social History Narrative  . Not on file    ALLERGIES: Patient has no known allergies.  MEDICATIONS:  Current Facility-Administered Medications  Medication Dose Route Frequency Provider Last Rate Last Dose  . 0.9 %  sodium chloride infusion   Intravenous Continuous Domenic Polite, MD 50 mL/hr at 09/15/17 1306    . dexamethasone (DECADRON) injection 4 mg  4 mg Intravenous Q12H Domenic Polite, MD   4 mg at 09/15/17 1023  . enoxaparin (LOVENOX) injection 40 mg  40 mg Subcutaneous Q24H Nita Sells, MD   40 mg at 09/14/17 2100  . morphine 4 MG/ML injection 1-2 mg  1-2 mg Intravenous Q2H PRN Nita Sells, MD   2 mg at 09/12/17 1921  . oxyCODONE (Oxy IR/ROXICODONE) immediate release tablet 5 mg  5 mg Oral Q3H PRN Nita Sells, MD   5 mg at 09/15/17 1359  . sodium chloride flush (NS) 0.9 % injection 3 mL  3 mL Intravenous Q12H Nita Sells, MD        REVIEW OF SYSTEMS:  On review of systems, the patient reports that he is doing well overall. He denies any chest pain, shortness of breath, cough, fevers, chills, night sweats, decreased appetite or unintended weight changes. He denies any bowel or bladder disturbances, and denies abdominal pain, nausea or vomiting. He has right flank pain with associated numbness/tingling in the flank.  He has noted increased urinary frequency but denies gross hematuria, dysuria, urgency, weak stream, intermittency or feelings of incomplete bladder emptying.  He denies any new musculoskeletal or joint aches or pains aside from the right flank/low back pain as outlined in the HPI. A complete review  of systems is obtained and is otherwise negative.    PHYSICAL EXAM:  Wt Readings from Last 3 Encounters:  09/12/17 167 lb (75.8 kg)  07/26/17 164 lb (74.4 kg)   Temp Readings from Last 3 Encounters:  09/15/17 99 F (37.2 C) (Oral)  07/26/17 98.1 F (36.7 C) (Oral)   BP Readings from Last 3 Encounters:  09/15/17 (!) 145/66  07/26/17 122/60   Pulse Readings from Last 3 Encounters:  09/15/17 (!) 58  07/26/17 (!) 55   Pain Assessment Pain Score: 6 /10  In general this is a well appearing African American male in no acute distress. He is alert and oriented x4 and appropriate throughout the examination. HEENT reveals that the patient is normocephalic, atraumatic. EOMs are  intact. PERRLA. Skin is intact without any evidence of gross lesions. Cardiovascular exam reveals a regular rate and rhythm, no clicks rubs or murmurs are auscultated. Chest is clear to auscultation bilaterally. Lymphatic assessment is performed and does not reveal any adenopathy in the cervical, supraclavicular, axillary, or inguinal chains. Abdomen has active bowel sounds in all quadrants and is intact. The abdomen is soft, non tender, non distended. Lower extremities are negative for pretibial pitting edema, deep calf tenderness, cyanosis or clubbing.  Sensation is intact to light touch in the upper and lower extremities equally.  Strength is 5/5 and equal bilaterally in the upper and lower extremities.   KPS = 90  100 - Normal; no complaints; no evidence of disease. 90   - Able to carry on normal activity; minor signs or symptoms of disease. 80   - Normal activity with effort; some signs or symptoms of disease. 36   - Cares for self; unable to carry on normal activity or to do active work. 60   - Requires occasional assistance, but is able to care for most of his personal needs. 50   - Requires considerable assistance and frequent medical care. 4   - Disabled; requires special care and assistance. 2   - Severely  disabled; hospital admission is indicated although death not imminent. 19   - Very sick; hospital admission necessary; active supportive treatment necessary. 10   - Moribund; fatal processes progressing rapidly. 0     - Dead  Karnofsky DA, Abelmann Bethany, Craver LS and Burchenal JH [phone]) The use of the nitrogen mustards in the palliative treatment of carcinoma: with particular reference to bronchogenic carcinoma Cancer 1 [id number]  LABORATORY DATA:  Lab Results  Component Value Date   WBC 10.7 (H) 09/13/2017   HGB 15.3 09/13/2017   HCT 43.3 09/13/2017   MCV 82.5 09/13/2017   PLT 270 09/13/2017   Lab Results  Component Value Date   NA 135 09/15/2017   K 4.4 09/15/2017   CL 105 09/15/2017   CO2 20 (L) 09/15/2017   Lab Results  Component Value Date   ALT 17 09/15/2017   AST 19 09/15/2017   ALKPHOS 108 09/15/2017   BILITOT 1.1 09/15/2017     RADIOGRAPHY: Dg Chest 2 View  Result Date: 09/12/2017 CLINICAL DATA:  CT today revealing lytic lesion in the right T12 vertebral body. Pt has been feeling unwell and suffering from back and side pain since January. EXAM: CHEST - 2 VIEW COMPARISON:  CT abdomen from earlier same day. No previous chest x-ray. FINDINGS: Heart size and mediastinal contours are normal. Lungs are clear. No pleural effusion seen. No acute or suspicious osseous findings. Lytic changes at the T12 vertebral body level are better demonstrated on earlier CT IMPRESSION: No active cardiopulmonary disease. No evidence of primary intrathoracic malignancy identified. Electronically Signed   By: Franki Cabot M.D.   On: 09/12/2017 15:20   Ct Chest W Contrast  Result Date: 09/13/2017 CLINICAL DATA:  Cancer screen, Cowden Syndrome/PHTS criteria, PTEN positive or familial mutation known BUN 18 Creat 1.32 GFR >60 EXAM: CT CHEST WITH CONTRAST TECHNIQUE: Multidetector CT imaging of the chest was performed during intravenous contrast administration. CONTRAST:  66m ISOVUE-300 IOPAMIDOL  (ISOVUE-300) INJECTION 61% COMPARISON:  09/12/2017 CHEST X-RAY FINDINGS: Cardiovascular: Heart size normal. Normal appearance of the thoracic aorta and pulmonary arteries accounting for the contrast bolus timing. Mediastinum/Nodes: The visualized portion of the thyroid gland has a normal appearance. No significant mediastinal, hilar, or axillary  adenopathy. Esophagus is normal in appearance. Lungs/Pleura: There is a small calcified nodule in the LEFT LOWER lobe. No suspicious pulmonary nodules. There is no pleural effusion or consolidation. There is minimal scarring or atelectasis in the RIGHT LOWER lobe. Airways are patent. Upper Abdomen: Numerous low-attenuation lesions are again identified throughout the liver. The liver is enlarged and only partially imaged. Musculoskeletal: Numerous lytic lesions again identified throughout the thoracic spine, with extensive involvement of T12. There is a pathologic fracture with retropulsion, narrowing the thecal sac at T12. Associated soft tissue mass extends into the RIGHT intervertebral foramen at T12-L1. Smaller lytic lesions are identified in T11, T9, and C7. Recent MRI demonstrated additional lesions. IMPRESSION: 1. No pulmonary nodules or thoracic adenopathy. 2. Known lytic lesions involving the vertebral bodies. 3. Pathologic fracture with associated retropulsion and soft tissue mass at T12. 4. Numerous liver lesions and hepatomegaly. Electronically Signed   By: Nolon Nations M.D.   On: 09/13/2017 17:33   Mr Thoracic Spine W Wo Contrast  Result Date: 09/13/2017 CLINICAL DATA:  Lytic mass in the T12 vertebra with pathologic fracture. Back and right flank pain. EXAM: MRI THORACIC WITHOUT AND WITH CONTRAST TECHNIQUE: Multiplanar and multiecho pulse sequences of the thoracic spine were obtained without and with intravenous contrast. CONTRAST:  23m MULTIHANCE GADOBENATE DIMEGLUMINE 529 MG/ML IV SOLN COMPARISON:  Chest x-ray dated 09/12/2017 and CT scan of the  abdomen and pelvis dated 09/12/2017 FINDINGS: MRI THORACIC SPINE FINDINGS Alignment:  Physiologic. Vertebrae: There are small metastatic lesions in multiple vertebral bodies including C7, T5, T6, T8, T9, T10, T11 and L1. There is a larger lesion involving the majority of the T12 vertebra extending into the right pedicle and facets with a pathologic fracture of the posterior aspect of the right side of the vertebral body. Tumor extends into the epidural space anteriorly to the right and left of midline and into the right neural foramina at T11-12 and T12-L1. There is compression of the thecal sac. Distal spinal cord is slightly compressed without myelopathy. Tip of the conus is at L1. Cord: The thoracic spinal cord appears normal except for the mass effect of the epidural tumor at T12. No myelopathy. Paraspinal and other soft tissues: There is slight extension of tumor into the paraspinal soft tissues to the right of midline at T12 best seen on images 19 of series 11 and series 15. Disc levels: There are no disc protrusions or disc bulges. IMPRESSION: 1. Numerous lesions throughout the thoracic spine as well as in C7, consistent with metastatic disease. 2. Extensive tumor in the T12 vertebra with epidural extension slightly compressing the thecal sac. Minimal right paraspinal soft tissue extension at T12. Pathologic fracture of T12. Electronically Signed   By: JLorriane ShireM.D.   On: 09/13/2017 10:19   Ct Biopsy  Result Date: 09/14/2017 INDICATION: 57year old with lymphadenopathy and bone lesions. Suspect metastatic disease and tissue diagnosis is needed. EXAM: CT-GUIDED BIOPSY OF LEFT PELVIC LYMPH NODE MEDICATIONS: None. ANESTHESIA/SEDATION: Moderate (conscious) sedation was employed during this procedure. A total of Versed 2.0 mg and Fentanyl 100 mcg was administered intravenously. Moderate Sedation Time: 21 minutes. The patient's level of consciousness and vital signs were monitored continuously by  radiology nursing throughout the procedure under my direct supervision. FLUOROSCOPY TIME:  None COMPLICATIONS: None immediate. PROCEDURE: Informed written consent was obtained from the patient after a thorough discussion of the procedural risks, benefits and alternatives. All questions were addressed. A timeout was performed prior to the initiation of the procedure.  Patient was placed supine on CT scanner. Images through the pelvis were obtained. Left iliac lymph node was targeted for biopsy. Left lower abdomen is prepped and draped in sterile fashion. Skin was anesthetized with 1% lidocaine. A 17 gauge coaxial needle was directed through the left iliopsoas muscle to the pelvic lymph node with CT guidance. Needle position was confirmed within the lymph node. A total of 4 core biopsies were obtained with an 18 gauge core device. Specimens placed in saline. Needle removed without complication. Bandage placed over the puncture site. FINDINGS: Enlarged pelvic lymph nodes. Slightly low-density lymph node along the left external iliac nodal chain measures 3 cm and this node was biopsied. IMPRESSION: CT-guided core biopsies of a left pelvic lymph node. Electronically Signed   By: Markus Daft M.D.   On: 09/14/2017 15:46   Ct Renal Stone Study  Result Date: 09/12/2017 CLINICAL DATA:  Right flank pain for 1 month. EXAM: CT ABDOMEN AND PELVIS WITHOUT CONTRAST TECHNIQUE: Multidetector CT imaging of the abdomen and pelvis was performed following the standard protocol without IV contrast. COMPARISON:  None. FINDINGS: Lower chest: Tiny calcified nodule in the left lower lobe. Minimal bibasilar scarring. No pleural effusion. Normal heart size. Hepatobiliary: Innumerable low-density lesions throughout the liver measuring up to 6 cm in size and with attenuation suggestive of cysts. Hepatic enlargement, with the right lobe measuring 20 cm in craniocaudal length. Underdistended gallbladder containing intermediate density material,  possibly concentrated bile/sludge. No gross gallbladder wall thickening or acute pericholecystic inflammatory changes. No biliary dilatation. Pancreas: Unremarkable. Spleen: Unremarkable. Adrenals/Urinary Tract: Unremarkable adrenal glands. Subcentimeter low-density lesion in the left kidney, too small to fully characterize. No renal calculi or hydronephrosis. Unremarkable bladder. Stomach/Bowel: The stomach is within normal limits. There is no evidence of bowel obstruction or inflammation. The appendix is unremarkable. Vascular/Lymphatic: Normal caliber of the abdominal aorta. Borderline to mildly enlarged lower left para-aortic lymph node measuring 10 mm in short axis. Enlarged left common iliac lymph node measures 1.6 cm. Enlarged external iliac lymph nodes measure 1.6 cm on the right and 2.8 and 2.2 cm on the left. Reproductive: Mild enlargement of the prostate gland. Other: No intraperitoneal free fluid.  No abdominal wall hernia. Musculoskeletal: 3.3 cm predominantly lytic lesion in the right T12 vertebral body and posterior elements with right-sided vertebral compression fracture demonstrating 35% height loss. There is focal disruption of the posterior T12 vertebral body cortex and slight right-sided vertebral body retropulsion with possible small volume epidural tumor and likely mild spinal stenosis. 5 mm lytic lesion posteriorly in the T11 vertebral body. 6 mm lytic lesion in the left L4 inferior articular process. IMPRESSION: 1. Multiple lytic lesions in the spine and bilateral pelvic lymphadenopathy concerning for metastatic disease. 2. 3.3 cm lesion at T12 with pathologic compression fracture and possible small volume epidural tumor. Consider contrast-enhanced thoracic spine MRI for further evaluation. 3. Enlarged liver containing innumerable low-density lesions. Considerations include metastases and benign polycystic liver disease. Contrast-enhanced abdominal MRI is recommended for further evaluation.  Electronically Signed   By: Logan Bores M.D.   On: 09/12/2017 14:43      IMPRESSION/PLAN: 1. 57 y.o. 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  Biopsy was performed 09/14/17 and final pathology remains pending.  Today, I talked to the patient about the findings and workup thus far. We discussed the natural history of metastatic carcinoma and general treatment, highlighting the role of palliative radiotherapy in the management of painful bony disease. We discussed the  available radiation techniques, and focused on the details of logistics and delivery. I have reviewed his case and imaging with Dr. Sondra Come who agrees with the recommendation for 30 Gy in 10 fxs to T12 delivered over the course of 2 weeks. We reviewed the anticipated acute and late sequelae associated with radiation in this setting. Given the fact that his pain is not well controlled with steroids and narcotic pain medication, we will plan to have the patient transferred to [facility] in anticipation of beginning radiation treatment on Friday, 09/16/17. The patient was encouraged to ask questions that were answered to his satisfaction.  At the conclusion of our conversation, the patient elects to proceed with palliative radiotherapy to T12.  He has freely signed written consent and a copy of this document will be placed in his medical record.  We will proceed with CT simulation on Friday morning, 09/16/2017 and he will begin treatment later in the day.  We would anticipate being able to taper his steroids at the completion of treatment.     Nicholos Johns, PA-C

## 2017-09-15 NOTE — Progress Notes (Addendum)
PROGRESS NOTE    Jonathan Campbell  SFK:[id number] DOB: [birth year] DOA: 09/12/2017 PCP: Patient, No Pcp Per  Brief Narrative: 57 year old male with no past medical history presented to the emergency room 3/18 with low back pain/flank pain. CT abdomen pelvis noted lytic lesions throughout the thoracic spine, bilateral pelvic adenopathy, T12 pathological compression fracture and small epidural tumor, liver lesions concerning for metastatic disease, with unknown primary. 3/20 underwent pelvic LN biopsy per IR 3/21 Rad Onc consulted, Tx to [facility] for [place]:   Metastatic cancer with unknown primary -CT and MRI shows evidence of diffuse osseous metastasis of thoracic spine/T12 lytic tumor with epidural extension, bilateral pelvic adenopathy and liver metastasis -Appreciate consult from neurosurgery and neuro-oncology -continue IV Decadron, remains neurologically intact -s/p pelvic lymph node biopsy by interventional radiology 3/20 -Consulted radiation oncology, recommended Tx to [facility] for Simulation 3/22 -pain control with Morphine/oxycodone -PT consult  Leukocytosis -Mild, likely reactive, monitor  AKI -baseline creatinine was 1.2 in January, then 1.5->1.3 -now normal, monitor  Constipation  -due to narcotics -add laxatives  DVT prophylaxis:Lovenox Code Status: full code Family Communication:no family at bedside Disposition Plan: home pending biopsy, CT simulation, Pain control  Consultants:   Neurosurgery  Neurooncology Dr.Vaslow  IR  Procedures: CT guided core biopsies of left pelvic lymph node 3/20     Antimicrobials:    Subjective: -continues to have mod low back pain despite meds 7/10  Objective: Vitals:   09/14/17 1522 09/14/17 2055 09/15/17 0500 09/15/17 0730  BP: 134/78 124/70 128/70 119/72  Pulse: (!) 56 71 (!) 55 (!) 53  Resp: _0 Temp: 98.3 F (36.8 C) 99.6 F (37.6 C) (!) 97.5 F (36.4 C) 98.2 F (36.8 C)  TempSrc:  Oral Oral Oral Oral  SpO2: 100% 98% 96% 96%  Weight:      Height:        Intake/Output Summary (Last 24 hours) at 09/15/2017 1217 Last data filed at 09/15/2017 1100 Gross per 24 hour  Intake 1000 ml  Output 1176 ml  Net -176 ml   Filed Weights   09/12/17 1211 09/12/17 2025  Weight: 74.8 kg (165 lb) 75.8 kg (167 lb)    Examination:  Gen: thinly built male, sitting in bed, no distress HEENT: PERRLA, no JVD Lungs: Good air movement bilaterally, CTAB CVS: S1S2/RRR Abd: soft, Non tender, non distended, BS present Extremities: no edema Skin: no new rashes Neuro: strength 4+/5 in both legs Psychiatry: Judgement and insight appear normal. Mood & affect appropriate.     Data Reviewed:   CBC: Recent Labs  Lab 09/12/17 1555 09/13/17 0622  WBC 14.9* 10.7*  NEUTROABS 10.4*  --   HGB 16.2 15.3  HCT 45.5 43.3  MCV 81.8 82.5  PLT 297 174   Basic Metabolic Panel: Recent Labs  Lab 09/12/17 1555 09/13/17 0622 09/15/17 0753  NA 139 136 135  K 4.1 4.1 4.4  CL 103 103 105  CO2 26 24 20*  GLUCOSE 102* 101* 117*  BUN 23* 18 19  CREATININE 1.51* 1.32* 1.06  CALCIUM 9.1 8.7* 9.0   GFR: Estimated Creatinine Clearance: 77.8 mL/min (by C-G formula based on SCr of 1.06 mg/dL). Liver Function Tests: Recent Labs  Lab 09/12/17 1555 09/13/17 0622 09/15/17 0753  AST _1 ALT _2 ALKPHOS 106 98 108  BILITOT 1.2 0.8 1.1  PROT 6.9 6.6 7.2  ALBUMIN 3.5 3.1* 3.3*   No results for input(s): LIPASE, AMYLASE in  the last 168 hours. No results for input(s): AMMONIA in the last 168 hours. Coagulation Profile: Recent Labs  Lab 09/14/17 1230  INR 0.96   Cardiac Enzymes: No results for input(s): CKTOTAL, CKMB, CKMBINDEX, TROPONINI in the last 168 hours. BNP (last 3 results) No results for input(s): PROBNP in the last 8760 hours. HbA1C: No results for input(s): HGBA1C in the last 72 hours. CBG: No results for input(s): GLUCAP in the last 168 hours. Lipid  Profile: No results for input(s): CHOL, HDL, LDLCALC, TRIG, CHOLHDL, LDLDIRECT in the last 72 hours. Thyroid Function Tests: No results for input(s): TSH, T4TOTAL, FREET4, T3FREE, THYROIDAB in the last 72 hours. Anemia Panel: No results for input(s): VITAMINB12, FOLATE, FERRITIN, TIBC, IRON, RETICCTPCT in the last 72 hours. Urine analysis:    Component Value Date/Time   COLORURINE STRAW (A) 07/26/2017 1130   APPEARANCEUR CLEAR 07/26/2017 1130   LABSPEC 1.013 07/26/2017 1130   PHURINE 5.0 07/26/2017 1130   GLUCOSEU NEGATIVE 07/26/2017 1130   HGBUR NEGATIVE 07/26/2017 1130   BILIRUBINUR NEGATIVE 07/26/2017 1130   KETONESUR NEGATIVE 07/26/2017 1130   PROTEINUR NEGATIVE 07/26/2017 1130   NITRITE NEGATIVE 07/26/2017 1130   LEUKOCYTESUR NEGATIVE 07/26/2017 1130   Sepsis Labs: _0 (procalcitonin:4,lacticidven:4)  )No results found for this or any previous visit (from the past 240 hour(s)).       Radiology Studies: Ct Chest W Contrast  Result Date: 09/13/2017 CLINICAL DATA:  Cancer screen, Cowden Syndrome/PHTS criteria, PTEN positive or familial mutation known BUN 18 Creat 1.32 GFR >60 EXAM: CT CHEST WITH CONTRAST TECHNIQUE: Multidetector CT imaging of the chest was performed during intravenous contrast administration. CONTRAST:  31m ISOVUE-300 IOPAMIDOL (ISOVUE-300) INJECTION 61% COMPARISON:  09/12/2017 CHEST X-RAY FINDINGS: Cardiovascular: Heart size normal. Normal appearance of the thoracic aorta and pulmonary arteries accounting for the contrast bolus timing. Mediastinum/Nodes: The visualized portion of the thyroid gland has a normal appearance. No significant mediastinal, hilar, or axillary adenopathy. Esophagus is normal in appearance. Lungs/Pleura: There is a small calcified nodule in the LEFT LOWER lobe. No suspicious pulmonary nodules. There is no pleural effusion or consolidation. There is minimal scarring or atelectasis in the RIGHT LOWER lobe. Airways are patent. Upper  Abdomen: Numerous low-attenuation lesions are again identified throughout the liver. The liver is enlarged and only partially imaged. Musculoskeletal: Numerous lytic lesions again identified throughout the thoracic spine, with extensive involvement of T12. There is a pathologic fracture with retropulsion, narrowing the thecal sac at T12. Associated soft tissue mass extends into the RIGHT intervertebral foramen at T12-L1. Smaller lytic lesions are identified in T11, T9, and C7. Recent MRI demonstrated additional lesions. IMPRESSION: 1. No pulmonary nodules or thoracic adenopathy. 2. Known lytic lesions involving the vertebral bodies. 3. Pathologic fracture with associated retropulsion and soft tissue mass at T12. 4. Numerous liver lesions and hepatomegaly. Electronically Signed   By: ENolon NationsM.D.   On: 09/13/2017 17:33   Ct Biopsy  Result Date: 09/14/2017 INDICATION: 57year old with lymphadenopathy and bone lesions. Suspect metastatic disease and tissue diagnosis is needed. EXAM: CT-GUIDED BIOPSY OF LEFT PELVIC LYMPH NODE MEDICATIONS: None. ANESTHESIA/SEDATION: Moderate (conscious) sedation was employed during this procedure. A total of Versed 2.0 mg and Fentanyl 100 mcg was administered intravenously. Moderate Sedation Time: 21 minutes. The patient's level of consciousness and vital signs were monitored continuously by radiology nursing throughout the procedure under my direct supervision. FLUOROSCOPY TIME:  None COMPLICATIONS: None immediate. PROCEDURE: Informed written consent was obtained from the patient after a thorough discussion of the procedural  risks, benefits and alternatives. All questions were addressed. A timeout was performed prior to the initiation of the procedure. Patient was placed supine on CT scanner. Images through the pelvis were obtained. Left iliac lymph node was targeted for biopsy. Left lower abdomen is prepped and draped in sterile fashion. Skin was anesthetized with 1%  lidocaine. A 17 gauge coaxial needle was directed through the left iliopsoas muscle to the pelvic lymph node with CT guidance. Needle position was confirmed within the lymph node. A total of 4 core biopsies were obtained with an 18 gauge core device. Specimens placed in saline. Needle removed without complication. Bandage placed over the puncture site. FINDINGS: Enlarged pelvic lymph nodes. Slightly low-density lymph node along the left external iliac nodal chain measures 3 cm and this node was biopsied. IMPRESSION: CT-guided core biopsies of a left pelvic lymph node. Electronically Signed   By: Markus Daft M.D.   On: 09/14/2017 15:46        Scheduled Meds: . dexamethasone  4 mg Intravenous Q12H  . enoxaparin (LOVENOX) injection  40 mg Subcutaneous Q24H  . sodium chloride flush  3 mL Intravenous Q12H   Continuous Infusions: . sodium chloride 50 mL/hr at 09/14/17 1655     LOS: 3 days    Time spent: 51mn    PDomenic Polite MD [facility] Page via [email or web address], password [email or web address] After 7PM please contact night-coverage  09/15/2017, 12:17 PM

## 2017-09-16 ENCOUNTER — Telehealth: Payer: Self-pay | Admitting: Oncology

## 2017-09-16 ENCOUNTER — Ambulatory Visit
Admit: 2017-09-16 | Discharge: 2017-09-16 | Disposition: A | Discharge status: Home / Self Care | Payer: Medicaid Other | Attending: Radiation Oncology | Admitting: Radiation Oncology

## 2017-09-16 ENCOUNTER — Ambulatory Visit
Admission: RE | Admit: 2017-09-16 | Discharge: 2017-09-16 | Disposition: A | Discharge status: Home / Self Care | Payer: Medicaid Other | Source: Ambulatory Visit | Attending: Radiation Oncology | Admitting: Radiation Oncology

## 2017-09-16 VITALS — BP 143/81 | HR 61 | Temp 98.6°F

## 2017-09-16 DIAGNOSIS — C7989 Secondary malignant neoplasm of other specified sites: Secondary | ICD-10-CM

## 2017-09-16 DIAGNOSIS — C801 Malignant (primary) neoplasm, unspecified: Principal | ICD-10-CM

## 2017-09-16 NOTE — Addendum Note (Signed)
Encounter addended by: Gery Pray, MD on: 09/16/2017 6:59 PM  Actions taken: Sign clinical note

## 2017-09-16 NOTE — Progress Notes (Signed)
PROGRESS NOTE    Jonathan Campbell  WUJ:[id number] DOB: 11-06-1960 DOA: 09/12/2017 PCP: Patient, No Pcp Per  Brief Narrative: 57 year old male with no past medical history presented to the emergency room 3/18 with low back pain/flank pain. CT abdomen pelvis noted lytic lesions throughout the thoracic spine, bilateral pelvic adenopathy, T12 pathological compression fracture and small epidural tumor, liver lesions concerning for metastatic disease, with unknown primary. 3/20 underwent pelvic LN biopsy per IR 3/21 Rad Onc consulted,  3/22 Tx to [facility] for [place]:   Spinal Metastatic cancer /unknown primary -CT and MRI shows evidence of diffuse osseous metastasis of thoracic spine/T12 lytic tumor with epidural extension, bilateral pelvic adenopathy and liver metastasis -Appreciate consult from neurosurgery and neuro-oncology -continue IV Decadron, remains neurologically intact, will cut down dose -s/p pelvic lymph node biopsy by interventional radiology 3/20, results still pending this am -Consulted radiation oncology, recommended Tx to [facility] for Simulation 3/22, awaiting Tx this morning -pain control with Morphine/oxycodone, improving now rates pain at 6/10 -PT consult pending -will need formal oncology consult pending biopsy results  Leukocytosis -Mild, likely reactive, monitor  AKI -baseline creatinine was 1.2 in January, then 1.5->1.3 -now normal, monitor  Constipation  -due to narcotics -continue laxatives  DVT prophylaxis:Lovenox Code Status: full code Family Communication: fiance at bedside Disposition Plan: Home likely Monday or Tuesday after starting XRT  Consultants:   Neurosurgery  Neurooncology Dr.Vaslow  IR  Procedures: CT guided core biopsies of left pelvic lymph node 3/20     Antimicrobials:    Subjective: Overall better, awaiting Tx to [facility]  Objective: Vitals:   09/15/17 0730 09/15/17 1409 09/15/17 2135 09/16/17 0450  BP:  119/72 (!) 145/66 118/75 (!) 102/52  Pulse: (!) 53 (!) 58 (!) 54   Resp: 14 14 14 16   Temp: 98.2 F (36.8 C) 99 F (37.2 C) 98.6 F (37 C) 98 F (36.7 C)  TempSrc: Oral Oral Oral Oral  SpO2: 96% 99% 96% 98%  Weight:      Height:        Intake/Output Summary (Last 24 hours) at 09/16/2017 0950 Last data filed at 09/16/2017 0500 Gross per 24 hour  Intake 862 ml  Output 2026 ml  Net -1164 ml   Filed Weights   09/12/17 1211 09/12/17 2025  Weight: 74.8 kg (165 lb) 75.8 kg (167 lb)    Examination:  Gen: thinly built male, sitting in bed, no distress HEENT: PERRLA, Neck supple, no JVD Lungs: Good air movement bilaterally, CTAB CVS: S1S2/RRR Abd: soft, Non tender, non distended, BS present Extremities: No Cyanosis, Clubbing or edema Skin: no new rashes Neuro: strength 4+/5 in both legs Psychiatry: Judgement and insight appear normal. Mood & affect appropriate.     Data Reviewed:   CBC: Recent Labs  Lab 09/12/17 1555 09/13/17 0622  WBC 14.9* 10.7*  NEUTROABS 10.4*  --   HGB 16.2 15.3  HCT 45.5 43.3  MCV 81.8 82.5  PLT 297 956   Basic Metabolic Panel: Recent Labs  Lab 09/12/17 1555 09/13/17 0622 09/15/17 0753  NA 139 136 135  K 4.1 4.1 4.4  CL 103 103 105  CO2 26 24 20*  GLUCOSE 102* 101* 117*  BUN 23* 18 19  CREATININE 1.51* 1.32* 1.06  CALCIUM 9.1 8.7* 9.0   GFR: Estimated Creatinine Clearance: 77.8 mL/min (by C-G formula based on SCr of 1.06 mg/dL). Liver Function Tests: Recent Labs  Lab 09/12/17 1555 09/13/17 0622 09/15/17 0753  AST 28 19 19  ALT 23 18 17   ALKPHOS 106 98 108  BILITOT 1.2 0.8 1.1  PROT 6.9 6.6 7.2  ALBUMIN 3.5 3.1* 3.3*   No results for input(s): LIPASE, AMYLASE in the last 168 hours. No results for input(s): AMMONIA in the last 168 hours. Coagulation Profile: Recent Labs  Lab 09/17/2017 1230  INR 0.96   Cardiac Enzymes: No results for input(s): CKTOTAL, CKMB, CKMBINDEX, TROPONINI in the last 168 hours. BNP (last 3  results) No results for input(s): PROBNP in the last 8760 hours. HbA1C: No results for input(s): HGBA1C in the last 72 hours. CBG: No results for input(s): GLUCAP in the last 168 hours. Lipid Profile: No results for input(s): CHOL, HDL, LDLCALC, TRIG, CHOLHDL, LDLDIRECT in the last 72 hours. Thyroid Function Tests: No results for input(s): TSH, T4TOTAL, FREET4, T3FREE, THYROIDAB in the last 72 hours. Anemia Panel: No results for input(s): VITAMINB12, FOLATE, FERRITIN, TIBC, IRON, RETICCTPCT in the last 72 hours. Urine analysis:    Component Value Date/Time   COLORURINE STRAW (A) 07/26/2017 1130   APPEARANCEUR CLEAR 07/26/2017 1130   LABSPEC 1.013 07/26/2017 1130   PHURINE 5.0 07/26/2017 1130   GLUCOSEU NEGATIVE 07/26/2017 1130   HGBUR NEGATIVE 07/26/2017 1130   BILIRUBINUR NEGATIVE 07/26/2017 1130   KETONESUR NEGATIVE 07/26/2017 1130   PROTEINUR NEGATIVE 07/26/2017 1130   NITRITE NEGATIVE 07/26/2017 1130   LEUKOCYTESUR NEGATIVE 07/26/2017 1130   Sepsis Labs: @LABRCNTIP (procalcitonin:4,lacticidven:4)  )No results found for this or any previous visit (from the past 240 hour(s)).       Radiology Studies: Ct Biopsy  Result Date: 17-Sep-2017 INDICATION: 57 year old with lymphadenopathy and bone lesions. Suspect metastatic disease and tissue diagnosis is needed. EXAM: CT-GUIDED BIOPSY OF LEFT PELVIC LYMPH NODE MEDICATIONS: None. ANESTHESIA/SEDATION: Moderate (conscious) sedation was employed during this procedure. A total of Versed 2.0 mg and Fentanyl 100 mcg was administered intravenously. Moderate Sedation Time: 21 minutes. The patient's level of consciousness and vital signs were monitored continuously by radiology nursing throughout the procedure under my direct supervision. FLUOROSCOPY TIME:  None COMPLICATIONS: None immediate. PROCEDURE: Informed written consent was obtained from the patient after a thorough discussion of the procedural risks, benefits and alternatives. All  questions were addressed. A timeout was performed prior to the initiation of the procedure. Patient was placed supine on CT scanner. Images through the pelvis were obtained. Left iliac lymph node was targeted for biopsy. Left lower abdomen is prepped and draped in sterile fashion. Skin was anesthetized with 1% lidocaine. A 17 gauge coaxial needle was directed through the left iliopsoas muscle to the pelvic lymph node with CT guidance. Needle position was confirmed within the lymph node. A total of 4 core biopsies were obtained with an 18 gauge core device. Specimens placed in saline. Needle removed without complication. Bandage placed over the puncture site. FINDINGS: Enlarged pelvic lymph nodes. Slightly low-density lymph node along the left external iliac nodal chain measures 3 cm and this node was biopsied. IMPRESSION: CT-guided core biopsies of a left pelvic lymph node. Electronically Signed   By: Markus Daft M.D.   On: September 17, 2017 15:46        Scheduled Meds: . dexamethasone  4 mg Intravenous Q12H  . enoxaparin (LOVENOX) injection  40 mg Subcutaneous Q24H  . polyethylene glycol  17 g Oral Daily  . senna-docusate  1 tablet Oral BID  . sodium chloride flush  3 mL Intravenous Q12H   Continuous Infusions: . sodium chloride 50 mL/hr at 09/16/17 0500     LOS: 4  days    Time spent: 63min    Domenic Polite, MD [facility] Page via [email or web address], password [email or web address] After 7PM please contact night-coverage  09/16/2017, 9:50 AM

## 2017-09-16 NOTE — Progress Notes (Signed)
Report called to nurse on [place] within normal limits.  Patient resting with call light in reach.

## 2017-09-16 NOTE — Progress Notes (Signed)
  Radiation Oncology         [phone] ________________________________  Name: Jonathan Campbell MRN: [id number]  Date: 09/16/2017  DOB: [birth year]  SIMULATION AND TREATMENT PLANNING NOTE - inpatient    ICD-10-CM   1. 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  Identity was confirmed.  All relevant records and images related to the planned course of therapy were reviewed.  The patient freely provided informed written consent to proceed with treatment after reviewing the details related to the planned course of therapy. The consent form was witnessed and verified by the simulation staff.  Then, the patient was set-up in a stable reproducible  supine position for radiation therapy.  CT images were obtained.  Surface markings were placed.  The CT images were loaded into the planning software.  Then the target and avoidance structures were contoured.  Treatment planning then occurred.  The radiation prescription was entered and confirmed.  Then, I designed and supervised the construction of a total of 3 medically necessary complex treatment devices.  I have requested : 3D Simulation  I have requested a DVH of the following structures: spinal cord, kidneys, gross tumor volume.  I have ordered:dose calc.  PLAN:  The patient will receive 30 Gy in 10 fractions with first treatment later today.   -----------------------------------  Blair Promise, PhD, MD

## 2017-09-16 NOTE — Progress Notes (Signed)
Patient was transported to [facility] - via stretcher.

## 2017-09-16 NOTE — Progress Notes (Addendum)
  Radiation Oncology         [phone] ________________________________  Name: Jonathan Campbell MRN: [id number]  Date: 09/16/2017  DOB: Jan 19, 1961  Simulation Verification Note - Inpatient    ICD-10-CM   1. 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 The clinical setup was verified. Then port films were obtained and uploaded to the radiation oncology medical record software.  The treatment beams were carefully compared against the planned radiation fields. The position location and shape of the radiation fields was reviewed. They targeted volume of tissue appears to be appropriately covered by the radiation beams. Organs at risk appear to be excluded as planned.  Based on my personal review, I approved the simulation verification. The patient's treatment will proceed as planned.  -----------------------------------  Blair Promise, PhD, MD

## 2017-09-16 NOTE — Progress Notes (Signed)
PT Cancellation Note  Patient Details Name: Cowan Pilar MRN: [id number] DOB: 12/08/1960   Cancelled Treatment:    Reason Eval/Treat Not Completed: Patient at procedure or test/unavailable. Pt transferred to [facility] for [place]. Will follow up if patient returns.  Ellamae Sia, PT, DPT Acute Rehabilitation Services  Pager: [phone]    Willy Eddy 09/16/2017, 12:47 PM

## 2017-09-16 NOTE — Telephone Encounter (Signed)
Called [facility] and spoke to Britt, Therapist, sports.  There is not currently a bed available at [facility].  The charge nurse is working on getting an assignment and will call back.  Advised he needs to be at the [facility] by 10:45 am.

## 2017-09-17 DIAGNOSIS — C61 Malignant neoplasm of prostate: Secondary | ICD-10-CM

## 2017-09-17 DIAGNOSIS — C7951 Secondary malignant neoplasm of bone: Secondary | ICD-10-CM

## 2017-09-17 DIAGNOSIS — D72829 Elevated white blood cell count, unspecified: Secondary | ICD-10-CM

## 2017-09-17 LAB — BASIC METABOLIC PANEL
Anion gap: 9 (ref 5–15)
BUN: 28 mg/dL — ABNORMAL HIGH (ref 6–20)
CO2: 19 mmol/L — ABNORMAL LOW (ref 22–32)
Calcium: 9.1 mg/dL (ref 8.9–10.3)
Chloride: 108 mmol/L (ref 101–111)
Creatinine, Ser: 1.17 mg/dL (ref 0.61–1.24)
GFR calc Af Amer: >60 mL/min (ref >60)
GFR calc non Af Amer: >60 mL/min (ref >60)
Glucose, Bld: 137 mg/dL — ABNORMAL HIGH (ref 65–99)
Potassium: 4.7 mmol/L (ref 3.5–5.1)
Sodium: 136 mmol/L (ref 135–145)

## 2017-09-17 LAB — CBC
HCT: 45.1 % (ref 39.0–52.0)
Hemoglobin: 15.7 g/dL (ref 13.0–17.0)
MCH: 28.8 pg (ref 26.0–34.0)
MCHC: 34.8 g/dL (ref 30.0–36.0)
MCV: 82.6 fL (ref 78.0–100.0)
Platelets: 324 10*3/uL (ref 150–400)
RBC: 5.46 MIL/uL (ref 4.22–5.81)
RDW: 14.8 % (ref 11.5–15.5)
WBC: 14.8 10*3/uL — ABNORMAL HIGH (ref 4.0–10.5)

## 2017-09-17 MED ORDER — OXYCODONE HCL 5 MG PO TABS: 10.0000 mg | ORAL_TABLET | ORAL | Status: DC | PRN

## 2017-09-17 MED ORDER — OXYCODONE HCL 5 MG PO TABS
10.0000 mg | ORAL_TABLET | ORAL | Status: DC | PRN
  Administered 2017-09-17 – 2017-09-19 (×7): 10 mg via ORAL
  Filled 2017-09-17 (×7): qty 2

## 2017-09-17 NOTE — Progress Notes (Signed)
PT Cancellation Note  Patient Details Name: Jonathan Campbell MRN: [id number] DOB: 09-22-1960   Cancelled Treatment:    Reason Eval/Treat Not Completed: Medical issues which prohibited therapy Per 09/12/17 per Dr. Christella Noa, "He needs a tlso , but not a clamshell."  No brace in orders. Pt reports mention of a brace.  Will check with nursing.   Jisselle Poth,KATHrine E 09/17/2017, 2:42 PM Carmelia Bake, PT, DPT 09/17/2017 Pager: [phone]

## 2017-09-17 NOTE — Progress Notes (Signed)
Spoke with Jenny Reichmann from Ortho, we will need to get clarification on what type of brace patient is to wear. TLSO is a clam shell brace, Neuro needs to clarify what brace is needed. Will continue to monitor.

## 2017-09-17 NOTE — Progress Notes (Signed)
PROGRESS NOTE    Jonathan Campbell  WUJ:[id number] DOB: Jan 05, 1961 DOA: 09/12/2017 PCP: Patient, No Pcp Per   Brief Narrative: Patient is a 56 year old male with no significant past medical history presented to the emergency department on 3/18 with low back pain/flank pain.  CT abdomen/pelvis done in the emergency department on presentation showed lactic lesions throughout the thoracic spine, bilateral pelvic adenopathy, T12 pathological compression fracture and a small epidural tumor, liver lesions concerning for metastatic disease. Patient underwent pelvic lymph node biopsy per IR on 3/20 which showed metastatic adenocarcinoma  of prostate.  Radiation oncology consulted.  Medical oncology also consulted.  Patient has been planned for  radiation therapy and got CT simulation on 09/16/17.  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 Neurosurgery, neuro-oncology following.   Continue on IV Decadron.  Remains neurologically intact.  We will continue to taper the dose as appropriate.   Status post pelvic lymph node biopsy by IR on 3/20 showing metastatic prostate cancer.  Patient has been planned for radiation therapy and got CT simulation on 09/16/17.  Continue pain medications Physical therapy consultation requested. Will follow up with oncology.  Patient was seen  by Dr. Mickeal Skinner. TLSO braced   Leukocytosis: Most likely secondary to Decadron.  We will continue to monitor  Acute kidney injury: Improved with IV fluids.  Constipation: Secondary to narcotics.  Continue laxatives.     DVT prophylaxis: Lovenox Code Status: Full Family Communication: None present at the  bedside Disposition Plan: Home likely on Monday after starting on medicine therapy   Consultants: Neuro-oncology, neurosurgery, IR  Procedures: CT-guided biopsy of the left  pelvic lymph node on 3/20  Antimicrobials: None  Subjective: Patient seen and examined on the bedside this morning.  Remains comfortable.  Denies any complaints.  Objective: Vitals:   09/16/17 1117 09/16/17 1901 09/16/17 2050 09/17/17 0551  BP: (!) 144/80 (!) 145/82 111/74 116/70  Pulse: 67 68 63 70  Resp: 18 20 14 12   Temp: 98.3 F (36.8 C) 98.2 F (36.8 C) 98.6 F (37 C) 98.4 F (36.9 C)  TempSrc: Oral Oral Oral Oral  SpO2: 98% 98% 98% 97%  Weight:      Height: 5\' 9"  (1.753 m)       Intake/Output Summary (Last 24 hours) at 09/17/2017 1357 Last data filed at 09/17/2017 1300 Gross per 24 hour  Intake 720 ml  Output -  Net 720 ml   Filed Weights   09/12/17 1211 09/12/17 2025  Weight: 74.8 kg (165 lb) 75.8 kg (167 lb)    Examination:  General exam: Appears calm and comfortable ,Not in distress,average built HEENT:PERRL,Oral mucosa moist, Ear/Nose normal on gross exam Respiratory system: Bilateral equal air entry, normal vesicular breath sounds, no wheezes or crackles  Cardiovascular system: S1 & S2 heard, RRR. No JVD, murmurs, rubs, gallops or clicks. No pedal edema. Gastrointestinal system: Abdomen is nondistended, soft and nontender. No organomegaly or masses felt. Normal bowel sounds heard. Central nervous system: Alert and oriented. No focal neurological deficits. Extremities: No edema, no clubbing ,no cyanosis, distal peripheral pulses palpable. Skin: No rashes, lesions or ulcers,no icterus ,no pallor MSK: Normal muscle bulk,tone ,power Psychiatry: Judgement and insight appear normal. Mood & affect  appropriate.     Data Reviewed: I have personally reviewed following labs and imaging studies  CBC: Recent Labs  Lab 09/12/17 1555 09/13/17 0622 09/17/17 0345  WBC 14.9* 10.7* 14.8*   NEUTROABS 10.4*  --   --   HGB 16.2 15.3 15.7  HCT 45.5 43.3 45.1  MCV 81.8 82.5 82.6  PLT 297 270 741   Basic Metabolic Panel: Recent Labs  Lab 09/12/17 1555 09/13/17 0622 09/15/17 0753 09/17/17 0345  NA 139 136 135 136  K 4.1 4.1 4.4 4.7  CL 103 103 105 108  CO2 26 24 20* 19*  GLUCOSE 102* 101* 117* 137*  BUN 23* 18 19 28*  CREATININE 1.51* 1.32* 1.06 1.17  CALCIUM 9.1 8.7* 9.0 9.1   GFR: Estimated Creatinine Clearance: 70.5 mL/min (by C-G formula based on SCr of 1.17 mg/dL). Liver Function Tests: Recent Labs  Lab 09/12/17 1555 09/13/17 0622 09/15/17 0753  AST 28 19 19   ALT 23 18 17   ALKPHOS 106 98 108  BILITOT 1.2 0.8 1.1  PROT 6.9 6.6 7.2  ALBUMIN 3.5 3.1* 3.3*   No results for input(s): LIPASE, AMYLASE in the last 168 hours. No results for input(s): AMMONIA in the last 168 hours. Coagulation Profile: Recent Labs  Lab 09/14/17 1230  INR 0.96   Cardiac Enzymes: No results for input(s): CKTOTAL, CKMB, CKMBINDEX, TROPONINI in the last 168 hours. BNP (last 3 results) No results for input(s): PROBNP in the last 8760 hours. HbA1C: No results for input(s): HGBA1C in the last 72 hours. CBG: No results for input(s): GLUCAP in the last 168 hours. Lipid Profile: No results for input(s): CHOL, HDL, LDLCALC, TRIG, CHOLHDL, LDLDIRECT in the last 72 hours. Thyroid Function Tests: No results for input(s): TSH, T4TOTAL, FREET4, T3FREE, THYROIDAB in the last 72 hours. Anemia Panel: No results for input(s): VITAMINB12, FOLATE, FERRITIN, TIBC, IRON, RETICCTPCT in the last 72 hours. Sepsis Labs: No results for input(s): PROCALCITON, LATICACIDVEN in the last 168 hours.  No results found for this or any previous visit (from the past 240 hour(s)).       Radiology Studies: No results found.      Scheduled Meds: . dexamethasone  4 mg Intravenous Q12H  . enoxaparin (LOVENOX) injection  40 mg Subcutaneous Q24H  . polyethylene glycol  17 g Oral Daily  .  senna-docusate  1 tablet Oral BID  . sodium chloride flush  3 mL Intravenous Q12H   Continuous Infusions: . sodium chloride 50 mL/hr at 09/17/17 0901     LOS: 5 days    Time spent: 25  mins.More than 50% of that time was spent in counseling and/or coordination of care.      Shelly Coss, MD [facility] Pager [phone]  If 7PM-7AM, please contact night-coverage [email or web address] Password [facility] 09/17/2017, 1:57 PM

## 2017-09-18 LAB — CBC WITH DIFFERENTIAL/PLATELET
Basophils Absolute: 0 10*3/uL (ref 0.0–0.1)
Basophils Relative: 0 %
Eosinophils Absolute: 0 10*3/uL (ref 0.0–0.7)
Eosinophils Relative: 0 %
HCT: 44.1 % (ref 39.0–52.0)
Hemoglobin: 15.5 g/dL (ref 13.0–17.0)
Lymphocytes Relative: 9 %
Lymphs Abs: 1.3 10*3/uL (ref 0.7–4.0)
MCH: 29.2 pg (ref 26.0–34.0)
MCHC: 35.1 g/dL (ref 30.0–36.0)
MCV: 83.1 fL (ref 78.0–100.0)
Monocytes Absolute: 0.6 10*3/uL (ref 0.1–1.0)
Monocytes Relative: 4 %
Neutro Abs: 12.9 10*3/uL — ABNORMAL HIGH (ref 1.7–7.7)
Neutrophils Relative %: 87 %
Platelets: 334 10*3/uL (ref 150–400)
RBC: 5.31 MIL/uL (ref 4.22–5.81)
RDW: 14.7 % (ref 11.5–15.5)
WBC: 14.8 10*3/uL — ABNORMAL HIGH (ref 4.0–10.5)

## 2017-09-18 NOTE — Progress Notes (Signed)
PT Cancellation Note  Patient Details Name: Jonathan Campbell MRN: [id number] DOB: 07/02/1960   Cancelled Treatment:    Reason Eval/Treat Not Completed: Other (comment)  Still awaiting clarification and order of brace.  RN reports pt is getting up and using bathroom in room on his own.  Will return when brace present for further education on mobility and brace education.  Tytiana Coles,KATHrine E 09/18/2017, 2:04 PM Carmelia Bake, PT, DPT 09/18/2017 Pager: [phone]

## 2017-09-18 NOTE — Progress Notes (Signed)
PROGRESS NOTE    Jonathan Campbell  LZJ:[id number] DOB: 1960-09-06 DOA: 09/12/2017 PCP: Patient, No Pcp Per   Principal Problem:   Prostate cancer metastatic to bone [facility]) Active Problems:   Leucocytosis  Brief Narrative: Patient is a 57 year old male with no significant past medical history admitted on 09/12/17 with low back pain/flank pain.  CT abdomen/pelvis done in the emergency department on presentation showed lactic lesions throughout the thoracic spine, bilateral pelvic adenopathy, T12 pathological compression fracture and a small epidural tumor, liver lesions concerning for metastatic disease. Patient underwent pelvic lymph node biopsy per IR on 09/14/17 which showed metastatic adenocarcinoma  of prostate.  Radiation and general oncology consult appreciated patient has been planned for  radiation therapy and got CT simulation on 09/16/17.   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 Dr Christella Noa to clarify what kind of TLSO brace he wants for patient,  Neurosurgery, neuro-oncology input appreciated , Patient was seen  by Dr. Mickeal Skinner. continue on IV Decadron.  Remains neurologically intact.  Status post pelvic lymph node biopsy by IR on 09/14/17 showing metastatic prostate cancer, got CT simulation on 09/16/17.,  Awaiting initiation of radiation therapy next week , physical therapy may be able to work with him once he has the TLSO brace on  2)Leukocytosis: Most likely secondary to Decadron.  No evidence of acute infection  3)Acute kidney injury: Improved with IV fluids.  4)Constipation: Secondary to narcotics.  Continue laxatives.    DVT prophylaxis: Lovenox Code Status: Full Family Communication: None present at the bedside Disposition Plan: Home likely on Monday after starting  radiation therapy  Consultants: Neuro-oncology, neurosurgery, IR  Procedures: CT-guided biopsy of the left  pelvic lymph node on 3/20  Antimicrobials: None  Subjective: Abdominal pain is better, no BM, no  fever  Objective: Vitals:   09/17/17 1405 09/17/17 2212 09/18/17 0440 09/18/17 1500  BP: 128/90 120/77 114/65 133/74  Pulse: 62 63 (!) 58 66  Resp: 14 14 14 16   Temp: 98.2 F (36.8 C) 98.2 F (36.8 C) 98.1 F (36.7 C) 97.9 F (36.6 C)  TempSrc: Oral Oral Oral Oral  SpO2: 98% 98% 98% 100%  Weight:  68.2 kg (150 lb 5.7 oz)    Height:        Intake/Output Summary (Last 24 hours) at 09/18/2017 1826 Last data filed at 09/18/2017 1500 Gross per 24 hour  Intake 1620 ml  Output -  Net 1620 ml   Filed Weights   09/12/17 1211 09/12/17 2025 09/17/17 2212  Weight: 74.8 kg (165 lb) 75.8 kg (167 lb) 68.2 kg (150 lb 5.7 oz)    Examination:   Physical Exam  Gen:- Awake Alert, in no apparent distress  HEENT:- [place].AT, No sclera icterus Neck-Supple Neck,No JVD,.  Lungs-  CTAB , fair symmetrical air movement CV- S1, S2 normal Abd-  +ve B.Sounds, Abd Soft, No tenderness,    Extremity/Skin:- No  Edema, +ve   pulses Psych-affect is appropriate, oriented x3 Neuro-no new focal deficits, no tremors  MSK-mid back discomfort and pain with range of motion, palpation and positional change  Data Reviewed: I have personally reviewed following labs and imaging studies  CBC: Recent Labs  Lab 09/12/17 1555 09/13/17 0622 09/17/17 0345 09/18/17 0341  WBC 14.9* 10.7* 14.8* 14.8*  NEUTROABS 10.4*  --   --  12.9*  HGB 16.2 15.3 15.7 15.5  HCT 45.5 43.3 45.1 44.1  MCV 81.8 82.5 82.6 83.1  PLT 297 270 324 166   Basic Metabolic Panel: Recent Labs  Lab 09/12/17 1555 09/13/17 0622 09/15/17 0753 09/17/17 0345  NA 139 136 135 136  K 4.1 4.1 4.4 4.7  CL 103 103 105 108  CO2 26 24 20* 19*  GLUCOSE 102* 101* 117* 137*  BUN 23* 18 19 28*  CREATININE 1.51* 1.32* 1.06 1.17  CALCIUM  9.1 8.7* 9.0 9.1   GFR: Estimated Creatinine Clearance: 68 mL/min (by C-G formula based on SCr of 1.17 mg/dL). Liver Function Tests: Recent Labs  Lab 09/12/17 1555 09/13/17 0622 09/15/17 0753  AST 28 19 19   ALT 23 18 17   ALKPHOS 106 98 108  BILITOT 1.2 0.8 1.1  PROT 6.9 6.6 7.2  ALBUMIN 3.5 3.1* 3.3*   No results for input(s): LIPASE, AMYLASE in the last 168 hours. No results for input(s): AMMONIA in the last 168 hours. Coagulation Profile: Recent Labs  Lab 09/14/17 1230  INR 0.96   Cardiac Enzymes: No results for input(s): CKTOTAL, CKMB, CKMBINDEX, TROPONINI in the last 168 hours. BNP (last 3 results) No results for input(s): PROBNP in the last 8760 hours. HbA1C: No results for input(s): HGBA1C in the last 72 hours. CBG: No results for input(s): GLUCAP in the last 168 hours. Lipid Profile: No results for input(s): CHOL, HDL, LDLCALC, TRIG, CHOLHDL, LDLDIRECT in the last 72 hours. Thyroid Function Tests: No results for input(s): TSH, T4TOTAL, FREET4, T3FREE, THYROIDAB in the last 72 hours. Anemia Panel: No results for input(s): VITAMINB12, FOLATE, FERRITIN, TIBC, IRON, RETICCTPCT in the last 72 hours. Sepsis Labs: No results for input(s): PROCALCITON, LATICACIDVEN in the last 168 hours.  No results found for this or any previous visit (from the past 240 hour(s)).       Radiology Studies: No results found.   Scheduled Meds: . dexamethasone  4 mg Intravenous Q12H  . enoxaparin (LOVENOX) injection  40 mg Subcutaneous Q24H  . polyethylene glycol  17 g Oral Daily  . senna-docusate  1 tablet Oral BID  . sodium chloride flush  3 mL Intravenous Q12H   Continuous Infusions: . sodium chloride 50 mL/hr at 09/18/17 0308     LOS: 6 days    Roxan Hockey, MD [facility] Pager [phone]  If 7PM-7AM, please contact night-coverage [email or web address] Password [facility] 09/18/2017, 6:26 PM

## 2017-09-18 NOTE — Progress Notes (Addendum)
Spoke with Merlene Pulling, concerning brace for patient. He stated that he spoke with Rep from [employer] concerning the TLSO brace. We will need to confirm with Dr Christella Noa which brace he wants patient to have, this will have to be done on Monday during office hours because Dr Christella Noa is not on call this weekend. Will continue to monitor.

## 2017-09-19 ENCOUNTER — Ambulatory Visit
Admit: 2017-09-19 | Discharge: 2017-09-19 | Disposition: A | Discharge status: Home / Self Care | Payer: Medicaid Other | Attending: Radiation Oncology | Admitting: Radiation Oncology

## 2017-09-19 DIAGNOSIS — C61 Malignant neoplasm of prostate: Secondary | ICD-10-CM

## 2017-09-19 DIAGNOSIS — C7951 Secondary malignant neoplasm of bone: Principal | ICD-10-CM

## 2017-09-19 LAB — CREATININE, SERUM
Creatinine, Ser: 1.15 mg/dL (ref 0.61–1.24)
GFR calc Af Amer: >60 mL/min (ref >60)
GFR calc non Af Amer: >60 mL/min (ref >60)

## 2017-09-19 MED ORDER — OXYCODONE HCL 5 MG PO TABS: ORAL_TABLET | ORAL | 0 refills | Status: DC

## 2017-09-19 MED ORDER — RANITIDINE HCL 300 MG PO TABS: 300.0000 mg | ORAL_TABLET | Freq: Every day | ORAL | 1 refills | Status: DC

## 2017-09-19 MED ORDER — METHOCARBAMOL 750 MG PO TABS: 750.0000 mg | ORAL_TABLET | Freq: Three times a day (TID) | ORAL | 2 refills | Status: DC

## 2017-09-19 MED ORDER — POLYETHYLENE GLYCOL 3350 17 G PO PACK: 17.0000 g | PACK | Freq: Every day | ORAL | 2 refills | Status: DC

## 2017-09-19 MED ORDER — ACETAMINOPHEN 325 MG PO TABS: 650.0000 mg | ORAL_TABLET | Freq: Four times a day (QID) | ORAL | 0 refills | Status: DC | PRN

## 2017-09-19 MED ORDER — PREDNISONE 20 MG PO TABS: 40.0000 mg | ORAL_TABLET | Freq: Every day | ORAL | 0 refills | Status: DC

## 2017-09-19 MED FILL — oxyCODONE HCL 5 MG TABS: 5 | 2 days supply | Qty: 30 | Fill #0

## 2017-09-19 MED FILL — raNITIdine HCL 300 MG TABS: 300 | 30 days supply | Qty: 30 | Fill #0

## 2017-09-19 MED FILL — METHOCARBAMOL 750 MG TABS: 750 | 30 days supply | Qty: 90 | Fill #0

## 2017-09-19 MED FILL — predniSONE 20 MG TABS: 20 | 5 days supply | Qty: 10 | Fill #0

## 2017-09-19 NOTE — Discharge Summary (Signed)
Jonathan Campbell, is a 57 y.o. male  DOB 12/13/1960  MRN [id number].  Admission date:  09/12/2017  Admitting Physician  Nita Sells, MD  Discharge Date:  09/19/2017   Primary MD  Patient, No Pcp Per  Recommendations for primary care physician for things to follow:   1)Take Oxycodone 5 mg Tablets 1-2 tablets every 4 hours as needed for metastatic Cancer Related back pain 2)Drink plenty fluids and take stool softeners to avoid constipation 3)Take all other medications as prescribed 4)Return daily for your radiation therapy treatment at [facility] starting 09/20/2017 through 09/30/17 5)Follow up with Dr Christella Noa (NeuroSurgeon) within 1 week for recheck 6)Follow up with Dr Melina Fiddler oncologist in 1-2 weeks to discuss further treatment options 7) you need to establish care with the PCP/primary care physician and get referral to see urology for prostate cancer evaluation and treatment 8)Use TLSO Back Brace every time you get up from the bed you are up and about  Admission Diagnosis  Flank Pain   Discharge Diagnosis  Flank Pain   Principal Problem:   Prostate cancer metastatic to bone [facility]) Active Problems:   Leucocytosis      History reviewed. No pertinent past medical history.  Past Surgical History:  Procedure Laterality Date  . NO PAST SURGERIES         HPI  from the history and physical done on the day of admission:    57 year old male no significant past medical history seen 06/2017 in the emergency room for chest pain flank pain given steroid Dosepak as well as pain medication and had no significant improvement he had a repeat episode and came to emergency room to get checked out  At first visit when he came to the emergency room labs were normal although he had a mildly elevated BUN/creatinine 17/1.2  Today comes back and his urine/creatinine has bumped to 23/1.5 he has  a leukocytosis of 14 predominant left shift platelets 297 hemoglobin is 16 because of the pain he had a CT renal stone study that was performed showing lytic lesions in the spine bilateral pelvic lymphadenopathy concerning for metastatic disease 3.3 lesion at T12 with pathologic compression fracture and small volume epidural tumor and an enlarged liver with multiple innumerable low-density lesions an MRI was ordered by the emergency room   he has been taking without much relief Aspercreme, aspirin, ibuprofen and steroids that were prescribed to him   Hospital Course:     Brief Narrative: Patient is a 57 year old male with no significant past medical history admitted on 09/12/17 with low back pain/flank pain.  CT abdomen/pelvis done in the emergency department on presentation showed lactic lesions throughout the thoracic spine, bilateral pelvic adenopathy, T12 pathological compression fracture and a small epidural tumor, liver lesions concerning for metastatic disease. Patient underwent pelvic lymph node biopsy per IR on 09/14/17 which showed metastatic adenocarcinoma  of prostate.  Radiation and general oncology consult appreciated patient had radiation therapy CT simulation on 09/16/17.  First radiotherapy session was 09/19/2017  Plan:  1)Metastatic prostate cancer with metastases to spine: CT and MRI findings  show evidence of diffuse osseous metastasis to thoracic spine,C7, T12 tumor and pathological fracture  with epidural extension, bilateral pelvic adenopathy and liver metastasis. Dr Christella Noa advised TLSO brace  for patient, Neurosurgery, neuro-oncology input appreciated , Patient was seen  by Dr. Mickeal Skinner.  Treated with IV Decadron.  Remains neurologically intact.  Status post pelvic lymph node biopsy by IR on 09/14/17 showing metastatic prostate cancer, got CT simulation on 09/16/17.,  Had initiation of radiation therapy on 09/19/17, physical therapy eval appreciated, TLSO brace placed, Return daily  for your radiation therapy treatment at [facility] starting 09/20/2017 through 09/30/17  2)Leukocytosis: Most likely secondary to Decadron.  No evidence of acute infection  3)Acute kidney injury:   Resolved with hydration,   4)Constipation: Secondary to narcotics.  Continue laxatives.  Code Status: Full Family Communication: None present at the bedside Disposition Plan: Home  Consultants: Neuro-oncology, neurosurgery, IR  Procedures: CT-guided biopsy of the left  pelvic lymph node on 09/14/17  Antimicrobials: None  Discharge Condition: stable  Follow UP   Diet and Activity recommendation:  As advised  Discharge Instructions    Discharge Instructions    Call MD for:  difficulty breathing, headache or visual disturbances   Complete by:  As directed    Call MD for:  persistant dizziness or light-headedness   Complete by:  As directed    Call MD for:  persistant nausea and vomiting   Complete by:  As directed    Call MD for:  severe uncontrolled pain   Complete by:  As directed    Call MD for:  temperature >100.4   Complete by:  As directed    Diet - low sodium heart healthy   Complete by:  As directed    Discharge instructions   Complete by:  As directed    1)Take Oxycodone 5 mg Tablets 1-2 tablets every 4 hours as needed for metastatic Cancer Related back pain 2)Drink plenty fluids and take stool softeners to avoid constipation 3)Take all other medications as prescribed 4)Return daily for your radiation therapy treatment at [facility] starting 09/20/2017 through 09/30/17 5)Follow up with Dr Christella Noa (NeuroSurgeon) within 1 week for recheck 6)Follow up with Dr Melina Fiddler oncologist in 1-2 weeks to discuss further treatment options 7) you need to establish care with the PCP/primary care physician and get referral to see urology for prostate cancer evaluation and treatment 8)Use TLSO Back Brace every time you get up from the bed you are up and  about   Increase activity slowly   Complete by:  As directed        Discharge Medications     Allergies as of 09/19/2017   No Known Allergies     Medication List    STOP taking these medications   predniSONE 10 MG (48) Tbpk tablet Commonly known as:  STERAPRED UNI-PAK 48 TAB Replaced by:  predniSONE 20 MG tablet     TAKE these medications   acetaminophen 325 MG tablet Commonly known as:  TYLENOL Take 2 tablets (650 mg total) by mouth every 6 (six) hours as needed for mild pain. What changed:    medication strength  how much to take   methocarbamol 750 MG tablet Commonly known as:  ROBAXIN Take 1 tablet (750 mg total) by mouth 3 (three) times daily.   oxyCODONE 5 MG immediate release tablet Commonly known as:  ROXICODONE Take 1-2 tablets  every 4 hours as needed for cancer related back pain   polyethylene glycol packet Commonly known as:  MIRALAX / GLYCOLAX Take 17 g by mouth daily. Use OTC Clearax 17 g po daily   predniSONE 20 MG tablet Commonly known as:  DELTASONE Take 2 tablets (40 mg total) by mouth daily with breakfast. Replaces:  predniSONE 10 MG (48) Tbpk tablet   ranitidine 300 MG tablet Commonly known as:  ZANTAC Take 1 tablet (300 mg total) by mouth at bedtime.            Durable Medical Equipment  (From admission, onward)        Start     Ordered   09/19/17 1547  For home use only DME Walker rolling  [facility])  Once    Question:  Patient needs a walker to treat with the following condition  Answer:  Back ache   09/19/17 1547   09/19/17 1542  For home use only DME Walker  [facility])  Once    Question:  Patient needs a walker to treat with the following condition  Answer:  Back pain   09/19/17 1547   09/19/17 1542  DME 3-in-1  Once     09/19/17 1547   09/19/17 1457  For home use only DME 3 n 1  Once     09/19/17 1456   09/19/17 1456  For home use only DME Walker rolling  Once    Question:  Patient needs a walker to treat with the  following condition  Answer:  Weakness   09/19/17 1456     Major procedures and Radiology Reports - PLEASE review detailed and final reports for all details, in brief -   Dg Chest 2 View  Result Date: 09/12/2017 CLINICAL DATA:  CT today revealing lytic lesion in the right T12 vertebral body. Pt has been feeling unwell and suffering from back and side pain since January. EXAM: CHEST - 2 VIEW COMPARISON:  CT abdomen from earlier same day. No previous chest x-ray. FINDINGS: Heart size and mediastinal contours are normal. Lungs are clear. No pleural effusion seen. No acute or suspicious osseous findings. Lytic changes at the T12 vertebral body level are better demonstrated on earlier CT IMPRESSION: No active cardiopulmonary disease. No evidence of primary intrathoracic malignancy identified. Electronically Signed   By: Franki Cabot M.D.   On: 09/12/2017 15:20   Ct Chest W Contrast  Result Date: 09/13/2017 CLINICAL DATA:  Cancer screen, Cowden Syndrome/PHTS criteria, PTEN positive or familial mutation known BUN 18 Creat 1.32 GFR >60 EXAM: CT CHEST WITH CONTRAST TECHNIQUE: Multidetector CT imaging of the chest was performed during intravenous contrast administration. CONTRAST:  77m ISOVUE-300 IOPAMIDOL (ISOVUE-300) INJECTION 61% COMPARISON:  09/12/2017 CHEST X-RAY FINDINGS: Cardiovascular: Heart size normal. Normal appearance of the thoracic aorta and pulmonary arteries accounting for the contrast bolus timing. Mediastinum/Nodes: The visualized portion of the thyroid gland has a normal appearance. No significant mediastinal, hilar, or axillary adenopathy. Esophagus is normal in appearance. Lungs/Pleura: There is a small calcified nodule in the LEFT LOWER lobe. No suspicious pulmonary nodules. There is no pleural effusion or consolidation. There is minimal scarring or atelectasis in the RIGHT LOWER lobe. Airways are patent. Upper Abdomen: Numerous low-attenuation lesions are again identified throughout the  liver. The liver is enlarged and only partially imaged. Musculoskeletal: Numerous lytic lesions again identified throughout the thoracic spine, with extensive involvement of T12. There is a pathologic fracture with retropulsion, narrowing the thecal sac at T12. Associated soft  tissue mass extends into the RIGHT intervertebral foramen at T12-L1. Smaller lytic lesions are identified in T11, T9, and C7. Recent MRI demonstrated additional lesions. IMPRESSION: 1. No pulmonary nodules or thoracic adenopathy. 2. Known lytic lesions involving the vertebral bodies. 3. Pathologic fracture with associated retropulsion and soft tissue mass at T12. 4. Numerous liver lesions and hepatomegaly. Electronically Signed   By: Nolon Nations M.D.   On: 09/13/2017 17:33   Mr Thoracic Spine W Wo Contrast  Result Date: 09/13/2017 CLINICAL DATA:  Lytic mass in the T12 vertebra with pathologic fracture. Back and right flank pain. EXAM: MRI THORACIC WITHOUT AND WITH CONTRAST TECHNIQUE: Multiplanar and multiecho pulse sequences of the thoracic spine were obtained without and with intravenous contrast. CONTRAST:  28m MULTIHANCE GADOBENATE DIMEGLUMINE 529 MG/ML IV SOLN COMPARISON:  Chest x-ray dated 09/12/2017 and CT scan of the abdomen and pelvis dated 09/12/2017 FINDINGS: MRI THORACIC SPINE FINDINGS Alignment:  Physiologic. Vertebrae: There are small metastatic lesions in multiple vertebral bodies including C7, T5, T6, T8, T9, T10, T11 and L1. There is a larger lesion involving the majority of the T12 vertebra extending into the right pedicle and facets with a pathologic fracture of the posterior aspect of the right side of the vertebral body. Tumor extends into the epidural space anteriorly to the right and left of midline and into the right neural foramina at T11-12 and T12-L1. There is compression of the thecal sac. Distal spinal cord is slightly compressed without myelopathy. Tip of the conus is at L1. Cord: The thoracic spinal cord  appears normal except for the mass effect of the epidural tumor at T12. No myelopathy. Paraspinal and other soft tissues: There is slight extension of tumor into the paraspinal soft tissues to the right of midline at T12 best seen on images 19 of series 11 and series 15. Disc levels: There are no disc protrusions or disc bulges. IMPRESSION: 1. Numerous lesions throughout the thoracic spine as well as in C7, consistent with metastatic disease. 2. Extensive tumor in the T12 vertebra with epidural extension slightly compressing the thecal sac. Minimal right paraspinal soft tissue extension at T12. Pathologic fracture of T12. Electronically Signed   By: JLorriane ShireM.D.   On: 09/13/2017 10:19   Ct Biopsy  Result Date: 09/14/2017 INDICATION: 57year old with lymphadenopathy and bone lesions. Suspect metastatic disease and tissue diagnosis is needed. EXAM: CT-GUIDED BIOPSY OF LEFT PELVIC LYMPH NODE MEDICATIONS: None. ANESTHESIA/SEDATION: Moderate (conscious) sedation was employed during this procedure. A total of Versed 2.0 mg and Fentanyl 100 mcg was administered intravenously. Moderate Sedation Time: 21 minutes. The patient's level of consciousness and vital signs were monitored continuously by radiology nursing throughout the procedure under my direct supervision. FLUOROSCOPY TIME:  None COMPLICATIONS: None immediate. PROCEDURE: Informed written consent was obtained from the patient after a thorough discussion of the procedural risks, benefits and alternatives. All questions were addressed. A timeout was performed prior to the initiation of the procedure. Patient was placed supine on CT scanner. Images through the pelvis were obtained. Left iliac lymph node was targeted for biopsy. Left lower abdomen is prepped and draped in sterile fashion. Skin was anesthetized with 1% lidocaine. A 17 gauge coaxial needle was directed through the left iliopsoas muscle to the pelvic lymph node with CT guidance. Needle position  was confirmed within the lymph node. A total of 4 core biopsies were obtained with an 18 gauge core device. Specimens placed in saline. Needle removed without complication. Bandage placed over the puncture  site. FINDINGS: Enlarged pelvic lymph nodes. Slightly low-density lymph node along the left external iliac nodal chain measures 3 cm and this node was biopsied. IMPRESSION: CT-guided core biopsies of a left pelvic lymph node. Electronically Signed   By: Markus Daft M.D.   On: 09/14/2017 15:46   Ct Renal Stone Study  Result Date: 09/12/2017 CLINICAL DATA:  Right flank pain for 1 month. EXAM: CT ABDOMEN AND PELVIS WITHOUT CONTRAST TECHNIQUE: Multidetector CT imaging of the abdomen and pelvis was performed following the standard protocol without IV contrast. COMPARISON:  None. FINDINGS: Lower chest: Tiny calcified nodule in the left lower lobe. Minimal bibasilar scarring. No pleural effusion. Normal heart size. Hepatobiliary: Innumerable low-density lesions throughout the liver measuring up to 6 cm in size and with attenuation suggestive of cysts. Hepatic enlargement, with the right lobe measuring 20 cm in craniocaudal length. Underdistended gallbladder containing intermediate density material, possibly concentrated bile/sludge. No gross gallbladder wall thickening or acute pericholecystic inflammatory changes. No biliary dilatation. Pancreas: Unremarkable. Spleen: Unremarkable. Adrenals/Urinary Tract: Unremarkable adrenal glands. Subcentimeter low-density lesion in the left kidney, too small to fully characterize. No renal calculi or hydronephrosis. Unremarkable bladder. Stomach/Bowel: The stomach is within normal limits. There is no evidence of bowel obstruction or inflammation. The appendix is unremarkable. Vascular/Lymphatic: Normal caliber of the abdominal aorta. Borderline to mildly enlarged lower left para-aortic lymph node measuring 10 mm in short axis. Enlarged left common iliac lymph node measures 1.6  cm. Enlarged external iliac lymph nodes measure 1.6 cm on the right and 2.8 and 2.2 cm on the left. Reproductive: Mild enlargement of the prostate gland. Other: No intraperitoneal free fluid.  No abdominal wall hernia. Musculoskeletal: 3.3 cm predominantly lytic lesion in the right T12 vertebral body and posterior elements with right-sided vertebral compression fracture demonstrating 35% height loss. There is focal disruption of the posterior T12 vertebral body cortex and slight right-sided vertebral body retropulsion with possible small volume epidural tumor and likely mild spinal stenosis. 5 mm lytic lesion posteriorly in the T11 vertebral body. 6 mm lytic lesion in the left L4 inferior articular process. IMPRESSION: 1. Multiple lytic lesions in the spine and bilateral pelvic lymphadenopathy concerning for metastatic disease. 2. 3.3 cm lesion at T12 with pathologic compression fracture and possible small volume epidural tumor. Consider contrast-enhanced thoracic spine MRI for further evaluation. 3. Enlarged liver containing innumerable low-density lesions. Considerations include metastases and benign polycystic liver disease. Contrast-enhanced abdominal MRI is recommended for further evaluation. Electronically Signed   By: Logan Bores M.D.   On: 09/12/2017 14:43    Micro Results   No results found for this or any previous visit (from the past 240 hour(s)).  Today   Subjective    Jonathan Campbell today has no new concerns, back pain is better with oxycodone   , no fevers, voiding well, did well with physical therapy       Patient has been seen and examined prior to discharge   Objective   Blood pressure 123/71, pulse (!) 50, temperature 97.9 F (36.6 C), temperature source Oral, resp. rate 20, height '5\' 9"'$  (1.753 m), weight 68.2 kg (150 lb 5.7 oz), SpO2 96 %.  No intake or output data in the 24 hours ending 09/19/17 1548  Exam Gen:- Awake Alert, in no apparent distress  HEENT:- [employer].AT, No  sclera icterus Neck-Supple Neck,No JVD,.  Lungs-  CTAB , fair symmetrical air movement CV- S1, S2 normal Abd-  +ve B.Sounds, Abd Soft, No tenderness,    Extremity/Skin:- No  Edema, +ve   pulses Psych-affect is appropriate, oriented x3 Neuro-no new focal deficits, no tremors  MSK-mid back discomfort and pain with range of motion, palpation and positional change    Data Review   CBC w Diff:  Lab Results  Component Value Date   WBC 14.8 (H) 09/18/2017   HGB 15.5 09/18/2017   HCT 44.1 09/18/2017   PLT 334 09/18/2017   LYMPHOPCT 9 09/18/2017   MONOPCT 4 09/18/2017   EOSPCT 0 09/18/2017   BASOPCT 0 09/18/2017    CMP:  Lab Results  Component Value Date   NA 136 09/17/2017   K 4.7 09/17/2017   CL 108 09/17/2017   CO2 19 (L) 09/17/2017   BUN 28 (H) 09/17/2017   CREATININE 1.15 09/19/2017   PROT 7.2 09/15/2017   ALBUMIN 3.3 (L) 09/15/2017   BILITOT 1.1 09/15/2017   ALKPHOS 108 09/15/2017   AST 19 09/15/2017   ALT 17 09/15/2017  . Total Discharge time is about 33 minutes  Roxan Hockey M.D on 09/19/2017 at 3:48 PM  [facility]   Office  [phone]  Voice Recognition Viviann Spare dictation system was used to create this note, attempts have been made to correct errors. Please contact the author with questions and/or clarifications.

## 2017-09-19 NOTE — Progress Notes (Signed)
PT Cancellation Note  Patient Details Name: Jonathan Campbell MRN: [id number] DOB: September 19, 1960   Cancelled Treatment:    Reason Eval/Treat Not Completed: Other (comment)  Patient is waiting for TLSO and is now bedrest. Reports that he would like a RW and 3 in 1 which PT agrees will be beneficial. Will check back  After brace is delivered.    Claretha Cooper 09/19/2017, 10:46 AM Tresa Endo PT [phone]

## 2017-09-19 NOTE — Discharge Instructions (Signed)
1)Take Oxycodone 5 mg Tablets 1-2 tablets every 4 hours as needed for metastatic Cancer Related back pain 2)Drink plenty fluids and take stool softeners to avoid constipation 3)Take all other medications as prescribed 4)Return daily for your radiation therapy treatment at [facility] starting 09/20/2017 through 09/30/17 5)Follow up with Dr Christella Noa (NeuroSurgeon) within 1 week for recheck 6)Follow up with Dr Melina Fiddler oncologist in 1-2 weeks to discuss further treatment options 7) you need to establish care with the PCP/primary care physician and get referral to see urology for prostate cancer evaluation and treatment 8)Use TLSO Back Brace every time you get up from the bed you are up and about

## 2017-09-19 NOTE — Progress Notes (Signed)
Pt requesting RW and 3in1 for home. Pt is without insurance. This CM contacted [facility] DME rep to see if pt would qualify for charity. Marney Doctor RN,BSN,NCM [phone]

## 2017-09-19 NOTE — Evaluation (Signed)
Physical Therapy Evaluation Patient Details Name: Jonathan Campbell MRN: [id number] DOB: [birth year] Today's Date: 09/19/2017   History of Present Illness  57 yo admitted for increased back pain, found to have T12 pathological fracture, liver lesions. Order TLSO  Clinical Impression  Instructed patient and family in Don/doff TLSO, reviewed back precautions. Patient requesting Rw and 3 in1 which will be beneficial to decrease back strain when needed.  Pt admitted with above diagnosis. Pt currently with functional limitations due to the deficits listed below (see PT Problem List).  Pt will benefit from skilled PT to increase their independence and safety with mobility to allow discharge to the venue listed below.       Follow Up Recommendations No PT follow up    Equipment Recommendations  Rolling walker with 5" wheels;3in1 (PT)    Recommendations for Other Services       Precautions / Restrictions Precautions Precautions: Back Required Braces or Orthoses: Spinal Brace Spinal Brace: Thoracolumbosacral orthotic Restrictions Other Position/Activity Restrictions: ok to remove for supine and to shower.      Mobility  Bed Mobility Overal bed mobility: Independent                Transfers Overall transfer level: Independent                  Ambulation/Gait             General Gait Details: NT, patien has been up ad lib in room and chooses not to ambulate at this time due to pain limiting  Stairs            Wheelchair Mobility    Modified Rankin (Stroke Patients Only)       Balance                                             Pertinent Vitals/Pain Pain Assessment: 0-10 Pain Score: 6  Pain Location: back Pain Descriptors / Indicators: Aching Pain Intervention(s): Monitored during session;Limited activity within patient's tolerance;Premedicated before session    Home Living Family/patient expects to be discharged to:: Private  residence Living Arrangements: Alone Available Help at Discharge: Family Type of Home: House Home Access: Stairs to enter   Technical brewer of Steps: 2 Home Layout: One level Home Equipment: None      Prior Function Level of Independence: Independent               Hand Dominance        Extremity/Trunk Assessment   Upper Extremity Assessment Upper Extremity Assessment: Overall WFL for tasks assessed    Lower Extremity Assessment Lower Extremity Assessment: Overall WFL for tasks assessed    Cervical / Trunk Assessment Cervical / Trunk Assessment: Other exceptions Cervical / Trunk Exceptions: guarded for trunk,  Communication   Communication: No difficulties  Cognition Arousal/Alertness: Awake/alert Behavior During Therapy: WFL for tasks assessed/performed Overall Cognitive Status: Within Functional Limits for tasks assessed                                        General Comments      Exercises     Assessment/Plan    PT Assessment Patent does not need any further PT services  PT Problem List  PT Treatment Interventions      PT Goals (Current goals can be found in the Care Plan section)  Acute Rehab PT Goals Patient Stated Goal: to go home PT Goal Formulation: All assessment and education complete, DC therapy    Frequency     Barriers to discharge        Co-evaluation               AM-PAC PT "6 Clicks" Daily Activity  Outcome Measure Difficulty turning over in bed (including adjusting bedclothes, sheets and blankets)?: None Difficulty moving from lying on back to sitting on the side of the bed? : None Difficulty sitting down on and standing up from a chair with arms (e.g., wheelchair, bedside commode, etc,.)?: None Help needed moving to and from a bed to chair (including a wheelchair)?: None Help needed walking in hospital room?: None Help needed climbing 3-5 steps with a railing? : A Little 6 Click  Score: 23    End of Session   Activity Tolerance: Patient tolerated treatment well Patient left: in bed Nurse Communication: Mobility status PT Visit Diagnosis: Pain    Time: 2876-8115 PT Time Calculation (min) (ACUTE ONLY): 15 min   Charges:   PT Evaluation $PT Eval Low Complexity: 1 Low     PT G CodesTresa Campbell PT [phone]  Jonathan Campbell 09/19/2017, 12:46 PM

## 2017-09-20 ENCOUNTER — Encounter: Payer: Self-pay | Admitting: Oncology

## 2017-09-20 ENCOUNTER — Ambulatory Visit
Admission: RE | Admit: 2017-09-20 | Discharge: 2017-09-20 | Disposition: A | Discharge status: Home / Self Care | Payer: Medicaid Other | Source: Ambulatory Visit | Attending: Radiation Oncology | Admitting: Radiation Oncology

## 2017-09-20 ENCOUNTER — Telehealth: Payer: Self-pay | Admitting: Oncology

## 2017-09-20 DIAGNOSIS — C61 Malignant neoplasm of prostate: Secondary | ICD-10-CM | POA: Insufficient documentation

## 2017-09-20 DIAGNOSIS — C7951 Secondary malignant neoplasm of bone: Principal | ICD-10-CM

## 2017-09-20 DIAGNOSIS — M8448XA Pathological fracture, other site, initial encounter for fracture: Secondary | ICD-10-CM | POA: Insufficient documentation

## 2017-09-20 MED ORDER — SONAFINE EX EMUL
1.0000 "application " | Freq: Once | CUTANEOUS | Status: AC
  Administered 2017-09-20: 1 via TOPICAL

## 2017-09-20 NOTE — Telephone Encounter (Signed)
Pt has been scheduled to see Dr. Alen Blew on 4/3 at 2pm. Lft the appt date and time on the pt's vm. Letter mailed.

## 2017-09-21 ENCOUNTER — Ambulatory Visit
Admission: RE | Admit: 2017-09-21 | Discharge: 2017-09-21 | Disposition: A | Discharge status: Home / Self Care | Payer: Medicaid Other | Source: Ambulatory Visit | Attending: Radiation Oncology | Admitting: Radiation Oncology

## 2017-09-21 ENCOUNTER — Telehealth: Payer: Self-pay | Admitting: *Deleted

## 2017-09-21 ENCOUNTER — Other Ambulatory Visit: Payer: Self-pay | Admitting: *Deleted

## 2017-09-21 ENCOUNTER — Encounter: Payer: Self-pay | Admitting: *Deleted

## 2017-09-21 DIAGNOSIS — C61 Malignant neoplasm of prostate: Secondary | ICD-10-CM

## 2017-09-21 DIAGNOSIS — C7951 Secondary malignant neoplasm of bone: Secondary | ICD-10-CM | POA: Diagnosis not present

## 2017-09-21 NOTE — Telephone Encounter (Signed)
Los to scheduling for lab appt 3/28 or 3/29.

## 2017-09-21 NOTE — Telephone Encounter (Signed)
Patient is been seen in rad onc for radiation. Nurse karen hess asking if dr Alen Blew would like any labs drawn on patient prior to his new patient visit on 09/28/17? PSA, cbc, c-met?

## 2017-09-21 NOTE — Telephone Encounter (Signed)
He needs a PSA only to be done on 3/28 or 3/29. Please send a message to scheduling.

## 2017-09-22 ENCOUNTER — Ambulatory Visit
Admission: RE | Admit: 2017-09-22 | Discharge: 2017-09-22 | Disposition: A | Discharge status: Home / Self Care | Payer: Medicaid Other | Source: Ambulatory Visit | Attending: Radiation Oncology | Admitting: Radiation Oncology

## 2017-09-22 ENCOUNTER — Telehealth: Payer: Self-pay

## 2017-09-22 ENCOUNTER — Inpatient Hospital Stay: Payer: Medicaid Other | Attending: Oncology

## 2017-09-22 DIAGNOSIS — C7951 Secondary malignant neoplasm of bone: Secondary | ICD-10-CM | POA: Diagnosis not present

## 2017-09-22 DIAGNOSIS — C61 Malignant neoplasm of prostate: Secondary | ICD-10-CM | POA: Diagnosis present

## 2017-09-22 NOTE — Telephone Encounter (Signed)
Spoke with patient concerning a lab appointment to be added on for today. Patient will com early for that appointment. Per 3/28 in basket request

## 2017-09-23 ENCOUNTER — Other Ambulatory Visit: Payer: Self-pay | Admitting: Radiation Oncology

## 2017-09-23 ENCOUNTER — Ambulatory Visit
Admission: RE | Admit: 2017-09-23 | Discharge: 2017-09-23 | Disposition: A | Discharge status: Home / Self Care | Payer: Medicaid Other | Source: Ambulatory Visit | Attending: Radiation Oncology | Admitting: Radiation Oncology

## 2017-09-23 DIAGNOSIS — C7951 Secondary malignant neoplasm of bone: Secondary | ICD-10-CM | POA: Diagnosis not present

## 2017-09-23 LAB — PSA, TOTAL AND FREE
PSA, Free Pct: 4.3 %
PSA, Free: 35.1 ng/mL
Prostate Specific Ag, Serum: 819.7 ng/mL — ABNORMAL HIGH (ref 0.0–4.0)

## 2017-09-23 MED ORDER — OXYCODONE HCL 5 MG PO TABS: ORAL_TABLET | ORAL | 0 refills | Status: DC

## 2017-09-23 MED FILL — oxyCODONE HCL 5 MG TABS: 5 | 4 days supply | Qty: 40 | Fill #0

## 2017-09-26 ENCOUNTER — Ambulatory Visit
Admission: RE | Admit: 2017-09-26 | Discharge: 2017-09-26 | Disposition: A | Discharge status: Home / Self Care | Payer: Medicaid Other | Source: Ambulatory Visit | Attending: Radiation Oncology | Admitting: Radiation Oncology

## 2017-09-26 DIAGNOSIS — C7951 Secondary malignant neoplasm of bone: Secondary | ICD-10-CM | POA: Diagnosis not present

## 2017-09-26 DIAGNOSIS — C61 Malignant neoplasm of prostate: Secondary | ICD-10-CM | POA: Insufficient documentation

## 2017-09-26 DIAGNOSIS — Z510 Encounter for antineoplastic radiation therapy: Secondary | ICD-10-CM | POA: Insufficient documentation

## 2017-09-27 ENCOUNTER — Ambulatory Visit
Admission: RE | Admit: 2017-09-27 | Discharge: 2017-09-27 | Disposition: A | Discharge status: Home / Self Care | Payer: Medicaid Other | Source: Ambulatory Visit | Attending: Radiation Oncology | Admitting: Radiation Oncology

## 2017-09-27 DIAGNOSIS — Z510 Encounter for antineoplastic radiation therapy: Secondary | ICD-10-CM | POA: Diagnosis not present

## 2017-09-28 ENCOUNTER — Telehealth: Payer: Self-pay | Admitting: Oncology

## 2017-09-28 ENCOUNTER — Ambulatory Visit
Admission: RE | Admit: 2017-09-28 | Discharge: 2017-09-28 | Disposition: A | Discharge status: Home / Self Care | Payer: Medicaid Other | Source: Ambulatory Visit | Attending: Radiation Oncology | Admitting: Radiation Oncology

## 2017-09-28 ENCOUNTER — Inpatient Hospital Stay: Payer: Medicaid Other | Attending: Oncology | Admitting: Oncology

## 2017-09-28 VITALS — BP 124/87 | HR 93 | Temp 98.7°F | Resp 18 | Ht 69.0 in | Wt 160.7 lb

## 2017-09-28 DIAGNOSIS — G893 Neoplasm related pain (acute) (chronic): Secondary | ICD-10-CM | POA: Insufficient documentation

## 2017-09-28 DIAGNOSIS — Z79818 Long term (current) use of other agents affecting estrogen receptors and estrogen levels: Secondary | ICD-10-CM | POA: Diagnosis not present

## 2017-09-28 DIAGNOSIS — C801 Malignant (primary) neoplasm, unspecified: Secondary | ICD-10-CM

## 2017-09-28 DIAGNOSIS — F17210 Nicotine dependence, cigarettes, uncomplicated: Secondary | ICD-10-CM | POA: Diagnosis not present

## 2017-09-28 DIAGNOSIS — C7989 Secondary malignant neoplasm of other specified sites: Secondary | ICD-10-CM

## 2017-09-28 DIAGNOSIS — Z7952 Long term (current) use of systemic steroids: Secondary | ICD-10-CM

## 2017-09-28 DIAGNOSIS — C61 Malignant neoplasm of prostate: Secondary | ICD-10-CM | POA: Diagnosis present

## 2017-09-28 DIAGNOSIS — M549 Dorsalgia, unspecified: Secondary | ICD-10-CM | POA: Insufficient documentation

## 2017-09-28 DIAGNOSIS — F1210 Cannabis abuse, uncomplicated: Secondary | ICD-10-CM | POA: Diagnosis not present

## 2017-09-28 DIAGNOSIS — Z87311 Personal history of (healed) other pathological fracture: Secondary | ICD-10-CM | POA: Diagnosis not present

## 2017-09-28 DIAGNOSIS — C7951 Secondary malignant neoplasm of bone: Secondary | ICD-10-CM | POA: Insufficient documentation

## 2017-09-28 DIAGNOSIS — Z79899 Other long term (current) drug therapy: Secondary | ICD-10-CM

## 2017-09-28 DIAGNOSIS — Z510 Encounter for antineoplastic radiation therapy: Secondary | ICD-10-CM | POA: Diagnosis not present

## 2017-09-28 MED ORDER — BICALUTAMIDE 50 MG PO TABS: 50.0000 mg | ORAL_TABLET | Freq: Every day | ORAL | 0 refills | Status: DC

## 2017-09-28 NOTE — Progress Notes (Signed)
Reason for Referral: Prostate cancer  HPI: 57 year old gentleman currently of [place] where he lived the majority of his life.  He does not get routine medical care and has not seen primary care doctor for an extended period of time.  He presented with back pain and was hospitalized between 09/12/2013 and 09/19/2016.  His workup included a CT scan of the abdomen and pelvis which showed multiple lytic lesions in the spine and bilateral pelvic adenopathy.  He had a 3.3 cm T12 compression fraction and epidural normal tumor involvement.  He had an MRI of the thoracic spine at which showed numerous lesions throughout the thoracic spine as well as C7.  There is also extensive tumor at T12 and epidural extension and compression of the thecal sac.  Based on these findings, he was started on prednisone and has been receiving radiation therapy.  Tissue biopsy was obtained on 09/14/2017 of his pelvic lymph nodes that showed metastatic prostate cancer.  His PSA on 09/22/2017 was 819.  Clinically, he reports his back pain has improved and currently wears a brace when he ambulates.  He does take oxycodone for diffuse pain in his spine and his hip.  He is urinating freely without any hematuria or dysuria.  He denies any constipation or hematochezia.  His appetite is reasonable and of lost close to 5 pounds.  Other areas of pain including in his hips and his lower back.  He does not report any headaches, blurry vision, syncope or seizures. Does not report any fevers, chills or sweats.  Does not report any cough, wheezing or hemoptysis.  Does not report any chest pain, palpitation, orthopnea or leg edema.  Does not report any nausea, vomiting or abdominal pain.  Does not report any constipation or diarrhea.   Does not report frequency, urgency or hematuria.  Does not report any skin rashes or lesions. Does not report any heat or cold intolerance.  Does not report any lymphadenopathy or petechiae.  Does not report any anxiety  or depression.  Remaining review of systems is negative.    No past medical history on file.:  Past Surgical History:  Procedure Laterality Date  . NO PAST SURGERIES    :   Current Outpatient Medications:  .  acetaminophen (TYLENOL) 325 MG tablet, Take 2 tablets (650 mg total) by mouth every 6 (six) hours as needed for mild pain., Disp: 120 tablet, Rfl: 0 .  bicalutamide (CASODEX) 50 MG tablet, Take 1 tablet (50 mg total) by mouth daily., Disp: 30 tablet, Rfl: 0 .  methocarbamol (ROBAXIN) 750 MG tablet, Take 1 tablet (750 mg total) by mouth 3 (three) times daily., Disp: 90 tablet, Rfl: 2 .  oxyCODONE (ROXICODONE) 5 MG immediate release tablet, Take 1-2 tablets every 4 hours as needed for cancer related back pain, Disp: 40 tablet, Rfl: 0 .  polyethylene glycol (MIRALAX / GLYCOLAX) packet, Take 17 g by mouth daily. Use OTC Clearax 17 g po daily, Disp: 30 each, Rfl: 2 .  predniSONE (DELTASONE) 20 MG tablet, Take 2 tablets (40 mg total) by mouth daily with breakfast., Disp: 10 tablet, Rfl: 0 .  ranitidine (ZANTAC) 300 MG tablet, Take 1 tablet (300 mg total) by mouth at bedtime., Disp: 30 tablet, Rfl: 1:  No Known Allergies:  No family history on file.:  Social History   Socioeconomic History  . Marital status: Single    Spouse name: Not on file  . Number of children: Not on file  . Years of education:  Not on file  . Highest education level: Not on file  Occupational History  . Not on file  Social Needs  . Financial resource strain: Not on file  . Food insecurity:    Worry: Not on file    Inability: Not on file  . Transportation needs:    Medical: Not on file    Non-medical: Not on file  Tobacco Use  . Smoking status: Current Some Day Smoker    Types: Cigars  . Smokeless tobacco: Never Used  Substance and Sexual Activity  . Alcohol use: Yes    Comment: occ  . Drug use: Yes    Types: Marijuana    Comment: occ  . Sexual activity: Not on file  Lifestyle  . Physical  activity:    Days per week: Not on file    Minutes per session: Not on file  . Stress: Not on file  Relationships  . Social connections:    Talks on phone: Not on file    Gets together: Not on file    Attends religious service: Not on file    Active member of club or organization: Not on file    Attends meetings of clubs or organizations: Not on file    Relationship status: Not on file  . Intimate partner violence:    Fear of current or ex partner: Not on file    Emotionally abused: Not on file    Physically abused: Not on file    Forced sexual activity: Not on file  Other Topics Concern  . Not on file  Social History Narrative  . Not on file  :  Pertinent items are noted in HPI.  Exam: Blood pressure 124/87, pulse 93, temperature 98.7 F (37.1 C), temperature source Oral, resp. rate 18, height '5\' 9"'$  (1.753 m), weight 160 lb 11.2 oz (72.9 kg), SpO2 98 %.  ECOG 1 General appearance: alert and cooperative appeared without distress. Head: atraumatic without any abnormalities. Eyes: conjunctivae/corneas clear. PERRL.  Sclera anicteric. Throat: lips, mucosa, and tongue normal; without oral thrush or ulcers. Resp: clear to auscultation bilaterally without rhonchi, wheezes or dullness to percussion. Cardio: regular rate and rhythm, S1, S2 normal, no murmur, click, rub or gallop GI: soft, non-tender; bowel sounds normal; no masses,  no organomegaly Skin: Skin color, texture, turgor normal. No rashes or lesions Lymph nodes: Cervical, supraclavicular, and axillary nodes normal. Neurologic: Grossly normal without any motor, sensory or deep tendon reflexes. Musculoskeletal: No joint deformity or effusion.  CBC    Component Value Date/Time   WBC 14.8 (H) 09/18/2017 0341   RBC 5.31 09/18/2017 0341   HGB 15.5 09/18/2017 0341   HCT 44.1 09/18/2017 0341   PLT 334 09/18/2017 0341   MCV 83.1 09/18/2017 0341   MCH 29.2 09/18/2017 0341   MCHC 35.1 09/18/2017 0341   RDW 14.7 09/18/2017  0341   LYMPHSABS 1.3 09/18/2017 0341   MONOABS 0.6 09/18/2017 0341   EOSABS 0.0 09/18/2017 0341   BASOSABS 0.0 09/18/2017 0341     Chemistry      Component Value Date/Time   NA 136 09/17/2017 0345   K 4.7 09/17/2017 0345   CL 108 09/17/2017 0345   CO2 19 (L) 09/17/2017 0345   BUN 28 (H) 09/17/2017 0345   CREATININE 1.15 09/19/2017 0426      Component Value Date/Time   CALCIUM 9.1 09/17/2017 0345   ALKPHOS 108 09/15/2017 0753   AST 19 09/15/2017 0753   ALT 17 09/15/2017 0753  BILITOT 1.1 09/15/2017 0753       Ct Chest W Contrast  Result Date: 09/13/2017 CLINICAL DATA:  Cancer screen, Cowden Syndrome/PHTS criteria, PTEN positive or familial mutation known BUN 18 Creat 1.32 GFR >60 EXAM: CT CHEST WITH CONTRAST TECHNIQUE: Multidetector CT imaging of the chest was performed during intravenous contrast administration. CONTRAST:  41m ISOVUE-300 IOPAMIDOL (ISOVUE-300) INJECTION 61% COMPARISON:  09/12/2017 CHEST X-RAY FINDINGS: Cardiovascular: Heart size normal. Normal appearance of the thoracic aorta and pulmonary arteries accounting for the contrast bolus timing. Mediastinum/Nodes: The visualized portion of the thyroid gland has a normal appearance. No significant mediastinal, hilar, or axillary adenopathy. Esophagus is normal in appearance. Lungs/Pleura: There is a small calcified nodule in the LEFT LOWER lobe. No suspicious pulmonary nodules. There is no pleural effusion or consolidation. There is minimal scarring or atelectasis in the RIGHT LOWER lobe. Airways are patent. Upper Abdomen: Numerous low-attenuation lesions are again identified throughout the liver. The liver is enlarged and only partially imaged. Musculoskeletal: Numerous lytic lesions again identified throughout the thoracic spine, with extensive involvement of T12. There is a pathologic fracture with retropulsion, narrowing the thecal sac at T12. Associated soft tissue mass extends into the RIGHT intervertebral foramen at  T12-L1. Smaller lytic lesions are identified in T11, T9, and C7. Recent MRI demonstrated additional lesions. IMPRESSION: 1. No pulmonary nodules or thoracic adenopathy. 2. Known lytic lesions involving the vertebral bodies. 3. Pathologic fracture with associated retropulsion and soft tissue mass at T12. 4. Numerous liver lesions and hepatomegaly. Electronically Signed   By: ENolon NationsM.D.   On: 09/13/2017 17:33   Mr Thoracic Spine W Wo Contrast  Result Date: 09/13/2017 CLINICAL DATA:  Lytic mass in the T12 vertebra with pathologic fracture. Back and right flank pain. EXAM: MRI THORACIC WITHOUT AND WITH CONTRAST TECHNIQUE: Multiplanar and multiecho pulse sequences of the thoracic spine were obtained without and with intravenous contrast. CONTRAST:  14mMULTIHANCE GADOBENATE DIMEGLUMINE 529 MG/ML IV SOLN COMPARISON:  Chest x-ray dated 09/12/2017 and CT scan of the abdomen and pelvis dated 09/12/2017 FINDINGS: MRI THORACIC SPINE FINDINGS Alignment:  Physiologic. Vertebrae: There are small metastatic lesions in multiple vertebral bodies including C7, T5, T6, T8, T9, T10, T11 and L1. There is a larger lesion involving the majority of the T12 vertebra extending into the right pedicle and facets with a pathologic fracture of the posterior aspect of the right side of the vertebral body. Tumor extends into the epidural space anteriorly to the right and left of midline and into the right neural foramina at T11-12 and T12-L1. There is compression of the thecal sac. Distal spinal cord is slightly compressed without myelopathy. Tip of the conus is at L1. Cord: The thoracic spinal cord appears normal except for the mass effect of the epidural tumor at T12. No myelopathy. Paraspinal and other soft tissues: There is slight extension of tumor into the paraspinal soft tissues to the right of midline at T12 best seen on images 19 of series 11 and series 15. Disc levels: There are no disc protrusions or disc bulges.  IMPRESSION: 1. Numerous lesions throughout the thoracic spine as well as in C7, consistent with metastatic disease. 2. Extensive tumor in the T12 vertebra with epidural extension slightly compressing the thecal sac. Minimal right paraspinal soft tissue extension at T12. Pathologic fracture of T12. Electronically Signed   By: JaLorriane Shire.D.   On: 09/13/2017 10:19     Ct Renal Stone Study  Result Date: 09/12/2017 CLINICAL DATA:  Right  flank pain for 1 month. EXAM: CT ABDOMEN AND PELVIS WITHOUT CONTRAST TECHNIQUE: Multidetector CT imaging of the abdomen and pelvis was performed following the standard protocol without IV contrast. COMPARISON:  None. FINDINGS: Lower chest: Tiny calcified nodule in the left lower lobe. Minimal bibasilar scarring. No pleural effusion. Normal heart size. Hepatobiliary: Innumerable low-density lesions throughout the liver measuring up to 6 cm in size and with attenuation suggestive of cysts. Hepatic enlargement, with the right lobe measuring 20 cm in craniocaudal length. Underdistended gallbladder containing intermediate density material, possibly concentrated bile/sludge. No gross gallbladder wall thickening or acute pericholecystic inflammatory changes. No biliary dilatation. Pancreas: Unremarkable. Spleen: Unremarkable. Adrenals/Urinary Tract: Unremarkable adrenal glands. Subcentimeter low-density lesion in the left kidney, too small to fully characterize. No renal calculi or hydronephrosis. Unremarkable bladder. Stomach/Bowel: The stomach is within normal limits. There is no evidence of bowel obstruction or inflammation. The appendix is unremarkable. Vascular/Lymphatic: Normal caliber of the abdominal aorta. Borderline to mildly enlarged lower left para-aortic lymph node measuring 10 mm in short axis. Enlarged left common iliac lymph node measures 1.6 cm. Enlarged external iliac lymph nodes measure 1.6 cm on the right and 2.8 and 2.2 cm on the left. Reproductive: Mild  enlargement of the prostate gland. Other: No intraperitoneal free fluid.  No abdominal wall hernia. Musculoskeletal: 3.3 cm predominantly lytic lesion in the right T12 vertebral body and posterior elements with right-sided vertebral compression fracture demonstrating 35% height loss. There is focal disruption of the posterior T12 vertebral body cortex and slight right-sided vertebral body retropulsion with possible small volume epidural tumor and likely mild spinal stenosis. 5 mm lytic lesion posteriorly in the T11 vertebral body. 6 mm lytic lesion in the left L4 inferior articular process. IMPRESSION: 1. Multiple lytic lesions in the spine and bilateral pelvic lymphadenopathy concerning for metastatic disease. 2. 3.3 cm lesion at T12 with pathologic compression fracture and possible small volume epidural tumor. Consider contrast-enhanced thoracic spine MRI for further evaluation. 3. Enlarged liver containing innumerable low-density lesions. Considerations include metastases and benign polycystic liver disease. Contrast-enhanced abdominal MRI is recommended for further evaluation. Electronically Signed   By: Logan Bores M.D.   On: 09/12/2017 14:43    Assessment and Plan:   57 year old gentleman with the following issues:  1.  Advanced prostate cancer diagnosed in March 2019.  He presented with a PSA of 819.7 and metastatic disease in the spine as well as lymphadenopathy.  Tissue biopsy was obtained and confirmed the diagnosis with involvement of his lymph nodes.  The natural course of this disease was reviewed today and treatment options were reviewed.  He has advanced disease and the cornerstone of treating this condition includes long-term androgen deprivation.  I will be in the form of chemical castration using Lupron or surgical castration.  Complication associated with long-term androgen deprivation therapy would include hot flashes, weight gain, osteoporosis among others.  He understands it is  critical to start treatment as soon as possible to control his disease.  He is agreeable to proceed at this time with Lupron which will be given at 30 mg every 4 months.  He will be started on Casodex at 50 mg daily to combat the flare phenomenon.  Additional therapy will be discussed at a later date.  He would be an excellent candidate for systemic chemotherapy or Zytiga in addition to androgen deprivation therapy.  He is quite overwhelmed with all these treatments and we will sequence his treatment in the near future.  Given his his disease  that potentially has visceral metastasis, Taxotere chemotherapy would be recommended after he has started on androgen deprivation therapy.  2.  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  He is currently on prednisone which I assume will be tapered after completing radiation therapy.  3.  Bone pain: He is currently on oxycodone and his pain is manageable.  4.  Bone directed therapy: He will be a good candidate for Delton See which will be addressed in the future after obtaining dental clearance.  5.  Prognosis: He has an incurable malignancy but a disease that can be palliated effectively.  He is young and has excellent performance status and aggressive therapy is warranted.  60  minutes was spent with the patient face-to-face today.  More than 50% of time was dedicated to patient counseling, education and coordination of his multifaceted care.

## 2017-09-28 NOTE — Telephone Encounter (Signed)
Appointments scheduled AVS/Calendar printed per 4/3 los °

## 2017-09-29 ENCOUNTER — Encounter: Payer: Self-pay | Admitting: Medical Oncology

## 2017-09-29 ENCOUNTER — Other Ambulatory Visit: Payer: Self-pay | Admitting: Radiation Oncology

## 2017-09-29 ENCOUNTER — Ambulatory Visit
Admission: RE | Admit: 2017-09-29 | Discharge: 2017-09-29 | Disposition: A | Discharge status: Home / Self Care | Payer: Medicaid Other | Source: Ambulatory Visit | Attending: Radiation Oncology | Admitting: Radiation Oncology

## 2017-09-29 DIAGNOSIS — Z510 Encounter for antineoplastic radiation therapy: Secondary | ICD-10-CM | POA: Diagnosis not present

## 2017-09-29 MED ORDER — OXYCODONE HCL 5 MG PO TABS: ORAL_TABLET | ORAL | 0 refills | Status: DC

## 2017-09-29 MED FILL — oxyCODONE HCL 5 MG TABS: 5 | 3 days supply | Qty: 40 | Fill #0

## 2017-09-29 NOTE — Progress Notes (Signed)
Introduced myself to patient as the prostate nurse navigator and my role. He was hospitalized with back pain and was diagnosed with metastatic prostate cancer. He completes radiation to the spine today. He saw Dr. Alen Blew in consult yesterday and is scheduled to get Lupron next week. We discussed the side effects of Lupron. I will continue to follow and asked him to call me with questions or concerns.

## 2017-09-30 MED FILL — BICALUTAMIDE 50 MG TABS: 50 | 30 days supply | Qty: 30 | Fill #0

## 2017-10-03 ENCOUNTER — Encounter: Payer: Self-pay | Admitting: Radiation Oncology

## 2017-10-03 NOTE — Progress Notes (Signed)
  Radiation Oncology         [phone] ________________________________  Name: Jonathan Campbell MRN: [id number]  Date: 10/03/2017  DOB: 10-Sep-1960  End of Treatment Note  Diagnosis: Metastatic prostate cancer with pathologic compression fracture at T12     Indication for treatment: Palliative     Radiation treatment dates: 09/16/17-09/29/17  Site/dose:  T Spine/ 30 Gy in 10 fractions  Beams/energy:  3D/ 15X  Narrative: The patient tolerated radiation treatment relatively well. During treatment the patient complained of 6/10 pain to the right lower back that he managed with Oxycodone. He stopped taking Prednisone during the treatment. His pain level improved with palliative radiation therapy to the thoracic spine.  Plan: The patient has completed radiation treatment. The patient will return to radiation oncology clinic for routine followup in one month. I advised them to call or return sooner if they have any questions or concerns related to their recovery or treatment.  -----------------------------------  Blair Promise, PhD, MD  This document serves as a record of services personally performed by Gery Pray, MD. It was created on his behalf by Bethann Humble, a trained medical scribe. The creation of this record is based on the scribe's personal observations and the provider's statements to them. This document has been checked and approved by the attending provider.

## 2017-10-05 ENCOUNTER — Inpatient Hospital Stay: Payer: Medicaid Other

## 2017-10-05 VITALS — BP 117/75 | HR 70 | Temp 98.5°F | Resp 18

## 2017-10-05 DIAGNOSIS — C61 Malignant neoplasm of prostate: Secondary | ICD-10-CM | POA: Diagnosis not present

## 2017-10-05 DIAGNOSIS — C7951 Secondary malignant neoplasm of bone: Principal | ICD-10-CM

## 2017-10-05 MED ORDER — LEUPROLIDE ACETATE (4 MONTH) 30 MG IM KIT
30.0000 mg | PACK | Freq: Once | INTRAMUSCULAR | Status: AC
  Administered 2017-10-05: 30 mg via INTRAMUSCULAR
  Filled 2017-10-05: qty 30

## 2017-10-07 ENCOUNTER — Other Ambulatory Visit: Payer: Self-pay | Admitting: Radiation Oncology

## 2017-10-07 ENCOUNTER — Telehealth: Payer: Self-pay | Admitting: Oncology

## 2017-10-07 MED ORDER — OXYCODONE HCL 5 MG PO TABS: ORAL_TABLET | ORAL | 0 refills | Status: DC

## 2017-10-07 MED FILL — oxyCODONE HCL 5 MG TABS: 5 | 3 days supply | Qty: 40 | Fill #0

## 2017-10-07 NOTE — Telephone Encounter (Signed)
Patient called and requested a refill on pain medication - oxycodone 5 mg that was last filled on 09/29/17.  Refill sent by Dr. Lisbeth Renshaw.  Called patient back and advised him that it has been sent to the [facility].

## 2017-10-13 ENCOUNTER — Other Ambulatory Visit: Payer: Self-pay | Admitting: Radiation Oncology

## 2017-10-13 MED ORDER — OXYCODONE HCL 5 MG PO TABS: ORAL_TABLET | ORAL | 0 refills | Status: DC

## 2017-10-13 MED FILL — oxyCODONE HCL 5 MG TABS: 5 | 3 days supply | Qty: 40 | Fill #0

## 2017-10-18 ENCOUNTER — Encounter: Payer: Self-pay | Admitting: *Deleted

## 2017-10-18 ENCOUNTER — Other Ambulatory Visit: Payer: Self-pay | Admitting: Radiation Oncology

## 2017-10-18 MED ORDER — OXYCODONE HCL 5 MG PO TABS: ORAL_TABLET | ORAL | 0 refills | Status: DC

## 2017-10-18 MED FILL — oxyCODONE HCL 5 MG TABS: 5 | 3 days supply | Qty: 40 | Fill #0

## 2017-10-18 NOTE — Progress Notes (Signed)
Pt called this AM requesting another refill on his Oxycodone prescription. Notified Dr. Sondra Come who requested to see pt first. Pt updated on plan and verbalized understanding.

## 2017-10-25 ENCOUNTER — Other Ambulatory Visit: Payer: Self-pay | Admitting: *Deleted

## 2017-10-25 ENCOUNTER — Other Ambulatory Visit: Payer: Self-pay | Admitting: Radiation Oncology

## 2017-10-25 MED ORDER — OXYCODONE HCL 5 MG PO TABS: ORAL_TABLET | ORAL | 0 refills | Status: DC

## 2017-10-25 MED ORDER — BICALUTAMIDE 50 MG PO TABS: 50.0000 mg | ORAL_TABLET | Freq: Every day | ORAL | 0 refills | Status: DC

## 2017-10-25 MED FILL — BICALUTAMIDE 50 MG TABS: 50 | 30 days supply | Qty: 30 | Fill #0

## 2017-10-25 MED FILL — oxyCODONE HCL 5 MG TABS: 5 | 3 days supply | Qty: 40 | Fill #0

## 2017-10-31 ENCOUNTER — Encounter: Payer: Self-pay | Admitting: Radiation Oncology

## 2017-10-31 ENCOUNTER — Ambulatory Visit
Admission: RE | Admit: 2017-10-31 | Discharge: 2017-10-31 | Disposition: A | Discharge status: Home / Self Care | Payer: Medicaid Other | Source: Ambulatory Visit | Attending: Radiation Oncology | Admitting: Radiation Oncology

## 2017-10-31 ENCOUNTER — Other Ambulatory Visit: Payer: Self-pay

## 2017-10-31 VITALS — BP 122/76 | HR 70 | Temp 98.9°F | Resp 18 | Wt 174.5 lb

## 2017-10-31 DIAGNOSIS — Z79899 Other long term (current) drug therapy: Secondary | ICD-10-CM | POA: Insufficient documentation

## 2017-10-31 DIAGNOSIS — R5383 Other fatigue: Secondary | ICD-10-CM | POA: Diagnosis not present

## 2017-10-31 DIAGNOSIS — C7951 Secondary malignant neoplasm of bone: Secondary | ICD-10-CM | POA: Insufficient documentation

## 2017-10-31 DIAGNOSIS — C61 Malignant neoplasm of prostate: Secondary | ICD-10-CM | POA: Diagnosis present

## 2017-10-31 DIAGNOSIS — Z923 Personal history of irradiation: Secondary | ICD-10-CM | POA: Insufficient documentation

## 2017-10-31 DIAGNOSIS — C801 Malignant (primary) neoplasm, unspecified: Secondary | ICD-10-CM

## 2017-10-31 DIAGNOSIS — C7989 Secondary malignant neoplasm of other specified sites: Secondary | ICD-10-CM

## 2017-10-31 MED ORDER — OXYCODONE HCL 5 MG PO TABS: ORAL_TABLET | ORAL | 0 refills | Status: DC

## 2017-10-31 MED FILL — oxyCODONE HCL 5 MG TABS: 5 | 3 days supply | Qty: 40 | Fill #0

## 2017-10-31 NOTE — Progress Notes (Signed)
Patient states that he is having pain in his lower back on the right side  And in the middle of his back.States that his skin is hyperpigmented. Denies any issues with his bowels. Denies any nausea or vomiting.States that he has urgency with urination.States that he has mild fatigue.Denies any hematuria. Reports nocturia x4.Denies any leakage with urination. States that his stream is strong.Patient is currently taking Casodex. Patient has a follow-up appoint with Dr. Alen Blew on 11/08/2017. Vitals:   10/31/17 1131  BP: 122/76  Pulse: 70  Resp: 18  Temp: 98.9 F (37.2 C)  TempSrc: Oral  SpO2: 100%  Weight: 174 lb 8 oz (79.2 kg)   Wt Readings from Last 3 Encounters:  10/31/17 174 lb 8 oz (79.2 kg)  09/28/17 160 lb 11.2 oz (72.9 kg)  09/17/17 150 lb 5.7 oz (68.2 kg)

## 2017-10-31 NOTE — Progress Notes (Signed)
Radiation Oncology         [phone]) [phone] ________________________________  Name: Jonathan Campbell MRN: [id number]  Date: 10/31/2017  DOB: Nov 17, 1960  Follow-Up Visit Note  CC: Patient, No Pcp Per  Jonathan Polite, MD    ICD-10-CM   1. Prostate cancer metastatic to bone ([place]) C61    C79.51   2. 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 male with metastatic prostate cancer with pathologic compression fracture at T12   Interval Since Last Radiation:  1 month Radiation treatment dates: 09/16/2017-09/29/2017 Site/dose:  T-Spine / 30 Gy in 10 fractions  Narrative:  The patient returns today for routine follow-up. He reports mild fatigue. He reports pain in his lower back on the right side and in the middle of his back, improved with wearing a brace. He denies any lower extremity weakness. He states that his skin is hyperpigmented. He denies any issues with his bowels. He denies any nausea or vomiting. He reports urinary urgency. He denies any hematuria. He reports nocturia x4. He denies any leakage with urination. He has a follow-up with Dr. Alen Blew on 11/08/17 and is currently taking Casodex.                            ALLERGIES:  has No Known Allergies.  Meds: Current Outpatient Medications  Medication Sig Dispense Refill  . bicalutamide (CASODEX) 50 MG tablet Take 1 tablet (50 mg total) by mouth daily. 30 tablet 0  . methocarbamol (ROBAXIN) 750 MG tablet Take 1 tablet (750 mg total) by mouth 3 (three) times daily. 90 tablet 2  . polyethylene glycol (MIRALAX / GLYCOLAX) packet Take 17 g by mouth daily. Use OTC Clearax 17 g po daily 30 each 2  . ranitidine (ZANTAC) 300 MG tablet Take 1 tablet (300 mg total) by mouth at bedtime. 30 tablet 1  . acetaminophen (TYLENOL) 325 MG tablet Take 2 tablets (650 mg total) by mouth every 6 (six) hours as needed for mild pain. (Patient not taking: Reported on 10/31/2017) 120 tablet 0    . oxyCODONE (ROXICODONE) 5 MG immediate release tablet Take 1-2 tablets every 4 hours as needed for cancer related back pain 40 tablet 0  . predniSONE (DELTASONE) 20 MG tablet Take 2 tablets (40 mg total) by mouth daily with breakfast. (Patient not taking: Reported on 10/31/2017) 10 tablet 0   No current facility-administered medications for this encounter.     Physical Findings: The patient is in no acute distress. Patient is alert and oriented.  weight is 174 lb 8 oz (79.2 kg). His oral temperature is 98.9 F (37.2 C). His blood pressure is 122/76 and his pulse is 70. His respiration is 18 and oxygen saturation is 100%.   Lungs are clear to auscultation bilaterally. Heart has regular rate and rhythm. No palpable cervical, supraclavicular, or axillary adenopathy. Abdomen soft, non-tender, normal bowel sounds. Lower motor strength in the proximal and distal muscle groups is 5/5.  Lab Findings: Lab Results  Component Value Date   WBC 14.8 (H) 09/18/2017   HGB 15.5 09/18/2017   HCT 44.1 09/18/2017   MCV 83.1 09/18/2017   PLT 334 09/18/2017    Radiographic Findings: No results found.  Impression:  Metastatic prostate cancer with pathologic compression fracture at T12. The patient is recovering from the effects of radiation and continues to have lower back/right  hip pain despite pain medication. We discussed the option of radiating the right hip for pain management with the patient would require additional imaging such as a bone scan, CT scan of abdomen and pelvis for further evaluation  Plan:  Follow up with Dr. Alen Blew on 11/08/17. When necessary follow-up in radiation oncology. Pain medication refill today.   ____________________________________  Blair Promise, PhD, MD  This document serves as a record of services personally performed by Gery Pray, MD. It was created on his behalf by Rae Lips, a trained medical scribe. The creation of this record is based on the scribe's  personal observations and the provider's statements to them. This document has been checked and approved by the attending provider.

## 2017-11-07 ENCOUNTER — Other Ambulatory Visit: Payer: Self-pay

## 2017-11-07 ENCOUNTER — Other Ambulatory Visit: Payer: Self-pay | Admitting: Oncology

## 2017-11-07 ENCOUNTER — Telehealth: Payer: Self-pay

## 2017-11-07 DIAGNOSIS — C801 Malignant (primary) neoplasm, unspecified: Principal | ICD-10-CM

## 2017-11-07 DIAGNOSIS — C7989 Secondary malignant neoplasm of other specified sites: Secondary | ICD-10-CM

## 2017-11-07 DIAGNOSIS — C7951 Secondary malignant neoplasm of bone: Secondary | ICD-10-CM

## 2017-11-07 DIAGNOSIS — C61 Malignant neoplasm of prostate: Secondary | ICD-10-CM

## 2017-11-07 NOTE — Telephone Encounter (Signed)
Pt called this morning asking for refill for pain prescription. Spoke with Dr. Sondra Come who requested to see pt tomorrow before refilling prescription. He would also like pt to have a repeat bone scan of pelvis upward to see what may be causing pt continued pain. Orders placed for scan (just waiting on per-certification in order to schedule appt). Pt called back with appt time for tomorrow and updated on plan for scan. Pt verbalized understanding and agreement.

## 2017-11-08 ENCOUNTER — Ambulatory Visit
Admission: RE | Admit: 2017-11-08 | Discharge: 2017-11-08 | Disposition: A | Discharge status: Home / Self Care | Payer: Medicaid Other | Source: Ambulatory Visit | Attending: Radiation Oncology | Admitting: Radiation Oncology

## 2017-11-08 ENCOUNTER — Other Ambulatory Visit: Payer: Self-pay | Admitting: Radiation Oncology

## 2017-11-08 ENCOUNTER — Other Ambulatory Visit: Payer: Self-pay | Admitting: *Deleted

## 2017-11-08 ENCOUNTER — Other Ambulatory Visit: Payer: Self-pay

## 2017-11-08 ENCOUNTER — Encounter: Payer: Self-pay | Admitting: Radiation Oncology

## 2017-11-08 ENCOUNTER — Inpatient Hospital Stay: Payer: Medicaid Other | Attending: Oncology

## 2017-11-08 ENCOUNTER — Inpatient Hospital Stay (HOSPITAL_BASED_OUTPATIENT_CLINIC_OR_DEPARTMENT_OTHER): Payer: Medicaid Other | Admitting: Oncology

## 2017-11-08 ENCOUNTER — Telehealth: Payer: Self-pay | Admitting: *Deleted

## 2017-11-08 VITALS — BP 114/79 | HR 82 | Temp 99.2°F | Resp 20 | Ht 69.0 in | Wt 174.2 lb

## 2017-11-08 VITALS — BP 120/77 | HR 63 | Temp 98.4°F | Resp 18 | Ht 69.0 in | Wt 174.6 lb

## 2017-11-08 DIAGNOSIS — Z7952 Long term (current) use of systemic steroids: Secondary | ICD-10-CM

## 2017-11-08 DIAGNOSIS — C61 Malignant neoplasm of prostate: Secondary | ICD-10-CM | POA: Insufficient documentation

## 2017-11-08 DIAGNOSIS — C7989 Secondary malignant neoplasm of other specified sites: Secondary | ICD-10-CM

## 2017-11-08 DIAGNOSIS — G893 Neoplasm related pain (acute) (chronic): Secondary | ICD-10-CM

## 2017-11-08 DIAGNOSIS — Z79899 Other long term (current) drug therapy: Secondary | ICD-10-CM | POA: Diagnosis not present

## 2017-11-08 DIAGNOSIS — C7951 Secondary malignant neoplasm of bone: Secondary | ICD-10-CM | POA: Insufficient documentation

## 2017-11-08 DIAGNOSIS — Z923 Personal history of irradiation: Secondary | ICD-10-CM | POA: Diagnosis not present

## 2017-11-08 DIAGNOSIS — R591 Generalized enlarged lymph nodes: Secondary | ICD-10-CM

## 2017-11-08 DIAGNOSIS — C801 Malignant (primary) neoplasm, unspecified: Principal | ICD-10-CM

## 2017-11-08 DIAGNOSIS — Z79818 Long term (current) use of other agents affecting estrogen receptors and estrogen levels: Secondary | ICD-10-CM | POA: Diagnosis not present

## 2017-11-08 LAB — CBC WITH DIFFERENTIAL (CANCER CENTER ONLY)
Basophils Absolute: 0.1 10*3/uL (ref 0.0–0.1)
Basophils Relative: 1 %
Eosinophils Absolute: 0.2 10*3/uL (ref 0.0–0.5)
Eosinophils Relative: 2 %
HCT: 44.2 % (ref 38.4–49.9)
Hemoglobin: 15.1 g/dL (ref 13.0–17.1)
Lymphocytes Relative: 8 %
Lymphs Abs: 0.7 10*3/uL — ABNORMAL LOW (ref 0.9–3.3)
MCH: 29 pg (ref 27.2–33.4)
MCHC: 34.3 g/dL (ref 32.0–36.0)
MCV: 84.7 fL (ref 79.3–98.0)
Monocytes Absolute: 0.6 10*3/uL (ref 0.1–0.9)
Monocytes Relative: 7 %
Neutro Abs: 7.2 10*3/uL — ABNORMAL HIGH (ref 1.5–6.5)
Neutrophils Relative %: 82 %
Platelet Count: 232 10*3/uL (ref 140–400)
RBC: 5.21 MIL/uL (ref 4.20–5.82)
RDW: 15.1 % — ABNORMAL HIGH (ref 11.0–14.6)
WBC Count: 8.8 10*3/uL (ref 4.0–10.3)

## 2017-11-08 LAB — CMP (CANCER CENTER ONLY)
ALT: 34 U/L (ref 0–55)
AST: 30 U/L (ref 5–34)
Albumin: 3.9 g/dL (ref 3.5–5.0)
Alkaline Phosphatase: 147 U/L (ref 40–150)
Anion gap: 6 (ref 3–11)
BUN: 13 mg/dL (ref 7–26)
CO2: 29 mmol/L (ref 22–29)
Calcium: 10.2 mg/dL (ref 8.4–10.4)
Chloride: 106 mmol/L (ref 98–109)
Creatinine: 1.45 mg/dL — ABNORMAL HIGH (ref 0.70–1.30)
GFR, Est AFR Am: >60 mL/min (ref >60)
GFR, Estimated: 52 mL/min — ABNORMAL LOW (ref >60)
Glucose, Bld: 111 mg/dL (ref 70–140)
Potassium: 4.4 mmol/L (ref 3.5–5.1)
Sodium: 141 mmol/L (ref 136–145)
Total Bilirubin: 0.6 mg/dL (ref 0.2–1.2)
Total Protein: 8 g/dL (ref 6.4–8.3)

## 2017-11-08 MED ORDER — OXYCODONE HCL 5 MG PO TABS: ORAL_TABLET | ORAL | 0 refills | Status: DC

## 2017-11-08 MED ORDER — ABIRATERONE ACETATE 250 MG PO TABS: 1000.0000 mg | ORAL_TABLET | Freq: Every day | ORAL | 0 refills | Status: DC

## 2017-11-08 MED FILL — oxyCODONE HCL 5 MG TABS: 5 | 3 days supply | Qty: 40 | Fill #0

## 2017-11-08 NOTE — Progress Notes (Signed)
Jonathan Campbell is here for a follow-up appointment. States that he is having pain in his upper and lower back. Patient is wearing his back brace. States that he has mild fatigue. States that he has pain in his left arm and shoulder . Also has pain in his right leg.States that he has itching in the area where he had radiation. Denies any nausea or vomiting. Denies any issues with his bowels.States that he has hot and cold flashes. Reports nocturia x4. Denies any dysuria or hematuria. Denies any any leakage with urination. States that he has some urgency with urination.Patient has an appointment today with Dr. Alen Blew. Vitals:   11/08/17 1134  BP: 114/79  Pulse: 82  Resp: 20  Temp: 99.2 F (37.3 C)  TempSrc: Oral  SpO2: 97%  Weight: 174 lb 3.2 oz (79 kg)  Height: 5\' 9"  (1.753 m)   Wt Readings from Last 3 Encounters:  11/08/17 174 lb 3.2 oz (79 kg)  10/31/17 174 lb 8 oz (79.2 kg)  09/28/17 160 lb 11.2 oz (72.9 kg)

## 2017-11-08 NOTE — Progress Notes (Signed)
Hematology and Oncology Follow Up Visit  Jonathan Campbell [id number] [birth year] 57 y.o. 11/08/2017 12:47 PM Patient, No Pcp PerNo ref. 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  He has disease to the bone as well as lymphadenopathy.  His PSA was 819.    Prior Therapy:  Radiation therapy between September 16, 2017 and September 29, 2017 to complete 230 Gy in 10 fractions to thoracic spine.   He completed 4 weeks of Casodex between April and May 2019.  Current therapy:  Lupron 30 mg every 4 months started on 10/05/2017.    Interim History: Jonathan Campbell presents today for a follow-up visit.  Since the last visit, he received the first Lupron injection and completed 1 months of Casodex.  He denies any complications related to this therapy.  He denies any excessive fatigue or tiredness.  He denies any hot flashes or weight gain.  He continues to have issues with back pain despite completing radiation therapy.  He continues to ambulate without any difficulties but cannot carry heavy objects.  He cannot participate in work-related duties.  He does not report any headaches, blurry vision, syncope or seizures. Does not report any fevers, chills or sweats.  Does not report any cough, wheezing or hemoptysis.  Does not report any chest pain, palpitation, orthopnea or leg edema.  Does not report any nausea, vomiting or abdominal pain.  Does not report any constipation or diarrhea.  Does not report any skeletal complaints.    Does not report frequency, urgency or hematuria.  Does not report any skin rashes or lesions. Does not report any heat or cold intolerance.  Does not report any lymphadenopathy or petechiae.  Does not report any anxiety or depression.  Remaining review of systems is negative.    Medications: I have reviewed the patient's current medications.  Current Outpatient Medications  Medication Sig Dispense Refill   . acetaminophen (TYLENOL) 325 MG tablet Take 2 tablets (650 mg total) by mouth every 6 (six) hours as needed for mild pain. (Patient not taking: Reported on 10/31/2017) 120 tablet 0  . bicalutamide (CASODEX) 50 MG tablet Take 1 tablet (50 mg total) by mouth daily. 30 tablet 0  . methocarbamol (ROBAXIN) 750 MG tablet Take 1 tablet (750 mg total) by mouth 3 (three) times daily. 90 tablet 2  . oxyCODONE (ROXICODONE) 5 MG immediate release tablet Take 1-2 tablets every 4 hours as needed for cancer related back pain 40 tablet 0  . polyethylene glycol (MIRALAX / GLYCOLAX) packet Take 17 g by mouth daily. Use OTC Clearax 17 g po daily (Patient not taking: Reported on 11/08/2017) 30 each 2  . predniSONE (DELTASONE) 20 MG tablet Take 2 tablets (40 mg total) by mouth daily with breakfast. (Patient not taking: Reported on 10/31/2017) 10 tablet 0  . ranitidine (ZANTAC) 300 MG tablet Take 1 tablet (300 mg total) by mouth at bedtime. 30 tablet 1   No current facility-administered medications for this visit.      Allergies: No Known Allergies  Past Medical History, Surgical history, Social history, and Family History were reviewed and updated.    Physical Exam:  ECOG:  General appearance: alert and cooperative appeared without distress. Head: Normocephalic, without obvious abnormality Oropharynx: No oral thrush or ulcers. Eyes: No scleral icterus.  Pupils are equal and round reactive to light. Lymph nodes: Cervical, supraclavicular, and axillary nodes normal. Heart:regular rate and rhythm, S1, S2 normal, no murmur, click, rub  or gallop Lung:chest clear, no wheezing, rales, normal symmetric air entry Abdomin: soft, non-tender, without masses or organomegaly. Neurological: No motor, sensory deficits.  Intact deep tendon reflexes. Skin: No rashes or lesions.  No ecchymosis or petechiae. Musculoskeletal: No joint deformity or effusion. Psychiatric: Mood and affect are appropriate.    Lab Results: Lab  Results  Component Value Date   WBC 14.8 (H) 09/18/2017   HGB 15.5 09/18/2017   HCT 44.1 09/18/2017   MCV 83.1 09/18/2017   PLT 334 09/18/2017     Chemistry      Component Value Date/Time   NA 136 09/17/2017 0345   K 4.7 09/17/2017 0345   CL 108 09/17/2017 0345   CO2 19 (L) 09/17/2017 0345   BUN 28 (H) 09/17/2017 0345   CREATININE 1.15 09/19/2017 0426      Component Value Date/Time   CALCIUM 9.1 09/17/2017 0345   ALKPHOS 108 09/15/2017 0753   AST 19 09/15/2017 0753   ALT 17 09/15/2017 0753   BILITOT 1.1 09/15/2017 0753        Impression and Plan:  57 year old man with  1.    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  He presented with a PSA of 819.7 and metastatic disease in the spine as well as lymphadenopathy.    He completed 1 month of Casodex in addition to androgen deprivation.  He denies any complications related to this therapy.  The risks and benefits of adding Zytiga 1000 mg daily to androgen deprivation was reviewed today.  Complications include nausea, fatigue, hypertension and hypokalemia were reviewed.  After discussion today he is agreeable to proceed with this therapy.  The benefit would be improving and metastatic free survival as well as overall survival associated with adding Zytiga to androgen deprivation.  He is already on prednisone for the time being.  2.  Spinal metastasis: He has T12 lesion completed radiation therapy.  He continues to have pain issues.  He is referred to interventional radiology for possible intervention to relieve the pain.  3.  Bone pain: He is currently on oxycodone which was refilled to him by Dr. Sondra Come.  4.  Bone directed therapy: This will be added in future visits.  He will need dental clearance before starting Xgeva.  Complications such as osteonecrosis of the jaw and hypocalcemia were reviewed.  5.  Prognosis: Treatment is palliative at this time.  His performance status is adequate and  aggressive therapy is warranted.  6.  Follow-up: We will be in 2 months to follow his progress.  25  minutes was spent with the patient face-to-face today.  More than 50% of time was dedicated to patient counseling, education and discussing future plan of care.     Zola Button, MD 5/14/201912:47 PM

## 2017-11-08 NOTE — Telephone Encounter (Signed)
CALLED PATIENT TO INFORM OF BONE SCAN ON 11-15-17 - ARRIVAL TIME - 9:45 AM @ [facility], AND RETURN @ 1 PM FOR SCAN, NO RESTRICTIONS TO TEST, SPOKE [employer] PATIENT AND HE IS AWARE OF THIS SCAN

## 2017-11-08 NOTE — Progress Notes (Addendum)
Radiation Oncology         [phone] ________________________________  Name: Jonathan Campbell MRN: [id number]  Date: 11/08/2017  DOB: May 11, 1961  Follow-Up Visit Note  CC: Patient, No Pcp Per  Domenic Polite, MD    ICD-10-CM   1. 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 Prostate cancer metastatic to bone [facility]) C61    C79.51     Diagnosis:   Metastatic prostate cancer  Interval Since Last Radiation:  1 months  Narrative:  The patient returns today for routine follow-up.  He continues to have a lot of pain in his back and right pelvis area. He is requiring frequent oxycodone dosing for this issue. In light of his continued pain I have referred him to be considered for interventional radiology procedure at T12. This is also complaining of pain in the right pelvis region and will set him up for a bone scan to see if there are any lesions that we could palliate concerning this issue. Patient will meet with Dr. Alen Blew later today.                              ALLERGIES:  has No Known Allergies.  Meds: Current Outpatient Medications  Medication Sig Dispense Refill  . bicalutamide (CASODEX) 50 MG tablet Take 1 tablet (50 mg total) by mouth daily. 30 tablet 0  . methocarbamol (ROBAXIN) 750 MG tablet Take 1 tablet (750 mg total) by mouth 3 (three) times daily. 90 tablet 2  . oxyCODONE (ROXICODONE) 5 MG immediate release tablet Take 1-2 tablets every 4 hours as needed for cancer related back pain 40 tablet 0  . ranitidine (ZANTAC) 300 MG tablet Take 1 tablet (300 mg total) by mouth at bedtime. 30 tablet 1  . abiraterone acetate (ZYTIGA) 250 MG tablet Take 4 tablets (1,000 mg total) by mouth daily. Take on an empty stomach 1 hour before or 2 hours after a meal 120 tablet 0  . acetaminophen (TYLENOL) 325 MG tablet Take 2 tablets (650 mg total) by mouth every 6 (six) hours as needed for mild pain. 120 tablet 0  . polyethylene glycol  (MIRALAX / GLYCOLAX) packet Take 17 g by mouth daily. Use OTC Clearax 17 g po daily (Patient not taking: Reported on 11/08/2017) 30 each 2  . predniSONE (DELTASONE) 20 MG tablet Take 2 tablets (40 mg total) by mouth daily with breakfast. (Patient not taking: Reported on 10/31/2017) 10 tablet 0   No current facility-administered medications for this encounter.     Physical Findings: The patient is in no acute distress. Patient is alert and oriented.  height is 5\' 9"  (1.753 m) and weight is 174 lb 3.2 oz (79 kg). His oral temperature is 99.2 F (37.3 C). His blood pressure is 114/79 and his pulse is 82. His respiration is 20 and oxygen saturation is 97%. .  No significant changes. He continues to wear a back brace all of waking hours. He has too much back pain when he tries to walk without the brace.  Lab Findings: Lab Results  Component Value Date   WBC 8.8 11/08/2017   HGB 15.1 11/08/2017   HCT 44.2 11/08/2017   MCV 84.7 11/08/2017   PLT 232 11/08/2017    Radiographic Findings: No results found.  Impression:  Metastatic prostate cancer. Patient's pain medication was refilled today. Patient has completed 4 weeks of  Casodex. He continues on Lupron. He will also start on Zytiga.  Plan:  Interventional radiology evaluation ofT12 lesion.Patient also proceed with bone scan. Will be seen after his bone scan is complete to determine if he would benefit from additional palliative radiation therapy.  ____________________________________ Gery Pray, MD

## 2017-11-09 ENCOUNTER — Ambulatory Visit
Admission: RE | Admit: 2017-11-09 | Discharge: 2017-11-09 | Disposition: A | Discharge status: Home / Self Care | Payer: Self-pay | Source: Ambulatory Visit | Attending: Radiation Oncology | Admitting: Radiation Oncology

## 2017-11-09 ENCOUNTER — Telehealth: Payer: Self-pay

## 2017-11-09 ENCOUNTER — Telehealth: Payer: Self-pay | Admitting: Pharmacist

## 2017-11-09 ENCOUNTER — Telehealth: Payer: Self-pay | Admitting: Pharmacy Technician

## 2017-11-09 ENCOUNTER — Telehealth: Payer: Self-pay | Admitting: *Deleted

## 2017-11-09 DIAGNOSIS — C801 Malignant (primary) neoplasm, unspecified: Principal | ICD-10-CM

## 2017-11-09 DIAGNOSIS — C7989 Secondary malignant neoplasm of other specified sites: Secondary | ICD-10-CM

## 2017-11-09 HISTORY — PX: IR RADIOLOGIST EVAL & MGMT: IMG5224

## 2017-11-09 HISTORY — DX: Malignant neoplasm of prostate: C79.51

## 2017-11-09 HISTORY — DX: Malignant neoplasm of prostate: C61

## 2017-11-09 HISTORY — DX: Wedge compression fracture of T11-T12 vertebra, initial encounter for closed fracture: S22.080A

## 2017-11-09 LAB — PROSTATE-SPECIFIC AG, SERUM (LABCORP): Prostate Specific Ag, Serum: 252.2 ng/mL — ABNORMAL HIGH (ref 0.0–4.0)

## 2017-11-09 NOTE — Telephone Encounter (Signed)
Printed calender to mail to patient. Spoke with patient on the phone concerning his upcoming appointment. Per 5/14 los

## 2017-11-09 NOTE — Consult Note (Signed)
Chief Complaint: I have back pain  Referring Physician(s): Kinard,James  History of Present Illness: Jonathan Campbell is a 57 y.o. male presenting as a scheduled consultation to Vascular & Interventional Radiology clinic, kindly referred by Dr. Sondra Come of Radiation Oncology, for evaluation of candidacy of vertebral augmentation with or without RFA of T12 pathologic fracture.    Jonathan Campbell is here today for his appointment by himself.  He tells me that he started having some vague abdominal pain in March of this year, and was seen in serial ED visits, which eventually led to an admission 09/12/2017 after CT scanning was performed showing metastatic disease.    He tells me his diagnosis was made at that time in March, and he initiated therapy with the oncology team.  During his hospitalization, MRI was performed that revealed multiple bone lesions of the thoracic spine, with the T12 level most involved.  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    He has had ongoing lower back pain, he tells me 10/10 intensity, of ache quality, which definitely is affecting his quality of life.  This pain will often keep him up at night.  He takes oxycodone, which he tells me does a fairly good job of decreasing the pain.  Other OTC meds do not seem to work for him.  He also has a back brace he is wearing, which gives him relief.    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  He is instructed not to lift heavy weights.  He is otherwise awake at normal hour and completes all of his daily activities without difficulty.  I would say he is ECOG 1.    His last imaging is the MRI and CT performed as an inpatient before his therapy was completed.       Past Medical History:  Diagnosis Date  . Compression fracture of T12 vertebra (HCC)   . Prostate cancer  metastatic to bone [facility])     Past Surgical History:  Procedure Laterality Date  . IR RADIOLOGIST EVAL & MGMT  11/09/2017  . NO PAST SURGERIES      Allergies: Patient has no known allergies.  Medications: Prior to Admission medications   Medication Sig Start Date End Date Taking? Authorizing Provider  abiraterone acetate (ZYTIGA) 250 MG tablet Take 4 tablets (1,000 mg total) by mouth daily. Take on an empty stomach 1 hour before or 2 hours after a meal 11/08/17   Wyatt Portela, MD  acetaminophen (TYLENOL) 325 MG tablet Take 2 tablets (650 mg total) by mouth every 6 (six) hours as needed for mild pain. 09/19/17   Roxan Hockey, MD  bicalutamide (CASODEX) 50 MG tablet Take 1 tablet (50 mg total) by mouth daily. 10/25/17   Wyatt Portela, MD  methocarbamol (ROBAXIN) 750 MG tablet Take 1 tablet (750 mg total) by mouth 3 (three) times daily. 09/19/17   Roxan Hockey, MD  oxyCODONE (ROXICODONE) 5 MG immediate release tablet Take 1-2 tablets every 4 hours as needed for cancer related back pain 11/08/17   Gery Pray, MD  polyethylene glycol (MIRALAX / GLYCOLAX) packet Take 17 g by mouth daily. Use OTC Clearax 17 g po daily Patient not taking: Reported on 11/08/2017 09/19/17   Roxan Hockey, MD  ranitidine (ZANTAC) 300 MG tablet Take 1 tablet (300 mg total) by  mouth at bedtime. 09/19/17 09/19/18  Roxan Hockey, MD     No family history on file.  Social History   Socioeconomic History  . Marital status: Single    Spouse name: Not on file  . Number of children: Not on file  . Years of education: Not on file  . Highest education level: Not on file  Occupational History  . Not on file  Social Needs  . Financial resource strain: Not on file  . Food insecurity:    Worry: Not on file    Inability: Not on file  . Transportation needs:    Medical: Not on file    Non-medical: Not on file  Tobacco Use  . Smoking status: Current Some Day Smoker    Types: Cigars  . Smokeless tobacco:  Never Used  Substance and Sexual Activity  . Alcohol use: Yes    Comment: occ  . Drug use: Yes    Types: Marijuana    Comment: occ  . Sexual activity: Not on file  Lifestyle  . Physical activity:    Days per week: Not on file    Minutes per session: Not on file  . Stress: Not on file  Relationships  . Social connections:    Talks on phone: Not on file    Gets together: Not on file    Attends religious service: Not on file    Active member of club or organization: Not on file    Attends meetings of clubs or organizations: Not on file    Relationship status: Not on file  Other Topics Concern  . Not on file  Social History Narrative  . Not on file    ECOG Status: 1 - Symptomatic but completely ambulatory  Review of Systems: A 12 point ROS discussed and pertinent positives are indicated in the HPI above.  All other systems are negative.  Review of Systems  Vital Signs: BP 112/66   Pulse 78   Temp 98.7 F (37.1 C) (Oral)   Resp 14   Ht 5\' 9"  (1.753 m)   Wt 174 lb (78.9 kg)   SpO2 97%   BMI 25.70 kg/m   Physical Exam General: 57 yo AA male appearing stated age.  Well-developed, well-nourished.  No distress. HEENT: Atraumatic, normocephalic.  Conjugate gaze, extra-ocular motor intact. No scleral icterus or scleral injection. No lesions on external ears, nose, lips, or gums.  Oral mucosa moist, pink.  Neck: Symmetric with no goiter enlargement.  Chest/Lungs:  Symmetric chest with inspiration/expiration.  No labored breathing.  He is wearing a brace .  Heart:   No JVD appreciated.  Abdomen:  Soft, NT/ND, with + bowel sounds.   Genito-urinary: Deferred Neurologic: Alert & Oriented to person, place, and time.   Normal affect and insight.  Appropriate questions.  Moving all 4 extremities with gross sensory intact. Symmetric strength of the bilateral lower extremity hip, knee, ankle flexors/extensors.  TTP at the midline lower thoracic.   Imaging: Ir Radiologist Eval &  Mgmt  Result Date: 11/09/2017 Please refer to notes tab for details about interventional procedure. (Op Note)   Labs:  CBC: Recent Labs    09/13/17 0622 09/17/17 0345 09/18/17 0341 11/08/17 1234  WBC 10.7* 14.8* 14.8* 8.8  HGB 15.3 15.7 15.5 15.1  HCT 43.3 45.1 44.1 44.2  PLT 270 324 334 232    COAGS: Recent Labs    09/14/17 1230  INR 0.96    BMP: Recent Labs    09/13/17  0623 09/15/17 0753 09/17/17 0345 09/19/17 0426 11/08/17 1234  NA 136 135 136  --  141  K 4.1 4.4 4.7  --  4.4  CL 103 105 108  --  106  CO2 24 20* 19*  --  29  GLUCOSE 101* 117* 137*  --  111  BUN 18 19 28*  --  13  CALCIUM 8.7* 9.0 9.1  --  10.2  CREATININE 1.32* 1.06 1.17 1.15 1.45*  GFRNONAA 59* >60 >60 >60 52*  GFRAA >60 >60 >60 >60 >60    LIVER FUNCTION TESTS: Recent Labs    09/12/17 1555 09/13/17 0622 09/15/17 0753 11/08/17 1234  BILITOT 1.2 0.8 1.1 0.6  AST 28 19 19 30   ALT 23 18 17  34  ALKPHOS 106 98 108 147  PROT 6.9 6.6 7.2 8.0  ALBUMIN 3.5 3.1* 3.3* 3.9    TUMOR MARKERS: No results for input(s): AFPTM, CEA, CA199, CHROMGRNA in the last 8760 hours.  Assessment and Plan:  Jonathan Campbell is a 57 year old male with diagnosis of metastatic prostate carcinoma, and a symptomatic T12 pathologic fracture.    He has received radiation therapy to this region, and has ongoing, life-style limiting pain at the site of 10/10 on most days, only partially controlled by narcotic medication.   I did discuss with Jonathan Campbell the logistics, anatomy/pathology/pathophysiology of pathologic compression fractures and treatment with RFA and vertebral augmentation.  I was specific that our goals are not to treat the cancer per se, but instead to debulk the cancer and allow treatment of the bone.  This is most successful in patients such as he who have high pain levels.    Specific risks discussed included: bleeding, infection, nerve injury, disability, need for further surgery/procedure, need for  hospitalization, cement embolization, progression of disease, cardiopulmonary collapse, death.    After a complete/thorough discussion, he would like to proceed with treatment, as he is quite uncomfortable.    Plan: - We will need repeat contrast MRI of the thoracic spine, as his last was 2 months ago before therapy, and he did have epidural extension.  I let him know that our imaging might preclude him from safe treatment, but necessary to repeat imaging for safety. - after MRI, will plan on Osteocool RFA bone T12 bone ablation and vertebral cement augmentation with moderate sedation, with Dr. Earleen Newport - I have advised him to observe his other doctors appointments   Thank you for this interesting consult.  I greatly enjoyed meeting Jonathan Campbell and look forward to participating in their care.  A copy of this report was sent to the requesting provider on this date.  Electronically Signed: Corrie Mckusick 11/09/2017, 4:12 PM   I spent a total of  40 Minutes   in face to face in clinical consultation, greater than 50% of which was counseling/coordinating care for pathologic T12 fracture secondary to metastatic prostate CA, possible RFA ablation, possible vertebral augmentation

## 2017-11-09 NOTE — Telephone Encounter (Signed)
-----   Message from Wyatt Portela, MD sent at 11/09/2017  8:45 AM EDT ----- Please let him know his PSA is down.

## 2017-11-09 NOTE — Telephone Encounter (Signed)
Oral Oncology Pharmacist Encounter  Received new prescription for Zytiga (abiraterone) for the treatment of metastatic, castration-sensitive prostate cancer in conjunction with prednsisone, planned duration until disease progression or unacceptable toxicity.  Labs from 11/08/17 assessed, OK for treatment. Noted SCr=1.45, CrCl~ 60 mL/min, no dose adjustment per manufacturer for decreased real function  Current medication list in Epic reviewed, no DDIs with Zytiga identified.  Patient will be instructed to stop Casodex when he start Zytiga.  Predisone 5mg  daily Rx will be sent to local pharmacy. I will verify with patient that he has completed prednisone taper for gout.  Patient currently without prescription medication coverage. We will work with patient to complete manufacturer assistance application for medication acquisition.  Oral Oncology Clinic will continue to follow for initial counseling and start date.  Johny Drilling, PharmD, BCPS, BCOP  11/09/2017 9:42 AM Oral Oncology Clinic [phone]

## 2017-11-09 NOTE — Telephone Encounter (Signed)
Oral Chemotherapy Pharmacist Encounter   I spoke with patient and fiance in [facility] lobby for overview of: Zytiga.   Counseled patient on administration, dosing, side effects, monitoring, drug-food interactions, safe handling, storage, and disposal.  Patient will take Zytiga 250mg  tablets, 4 tablets (1000mg ) by mouth once daily on an empty stomach, 1 hour before or 2 hours after a meal.  Patient states he will take his Zyitga first thing in the morning and will wait at least 1 hour before eating.  Patient will take prednisone 5mg  tablet, 1 tablet by mouth one daily with breakfast.  Zytiga start date: TBD, pending medication acquisition  Adverse effects include but are not limited to: peripheral edema, GI upset, hypertension, hot flashes, fatigue, and arthralgias.  Patient confirms he has no longer taking high-dose prednisone and is currently not on any steroids.  Medication has been removed from medication list.  Daily prednisone prescription will be sent to the [facility] once date of Remy acquisition is known. Patient will obtain prednisone and knows to start prednisone on the same day as Zytiga start.  Reviewed with patient importance of keeping a medication schedule and plan for any missed doses.  Mr. Paddock voiced understanding and appreciation.   All questions answered. Medication reconciliation performed and medication/allergy list updated.  Patient instructed to discontinue Casodex once he starts his Zytiga if he has not already stopped it.  Oral oncology patient advocate is working with patient to complete manufacturer assistance application.  Per patient, his Medicaid is still pending.  He will let us know as soon as he is approved and has Medicaid coverage. At that time he would like to obtain his Zytiga from the [facility]. Oral oncology clinic will help facilitate this once Medicaid coverage is active.  Patient knows to call  the office with questions or concerns.  Oral Oncology Clinic will continue to follow.  Thank you,  Johny Drilling, PharmD, BCPS, BCOP  11/09/2017 2:35 PM Oral Oncology Clinic [phone]

## 2017-11-09 NOTE — Telephone Encounter (Signed)
As noted below by Dr. Shadad, I informed patient of his PSA level. He verbalized understanding.  

## 2017-11-09 NOTE — Telephone Encounter (Signed)
Oral Oncology Patient Advocate Encounter  Confirmed with Mr. Jonathan Campbell that he is uninsured for prescriptions drugs at this time.  (coverage with Medicaid is pending)  He will be coming to the office today to sign an application for assistance from [place] and [place].    I will continue to provide status updates in this encounter.   Fabio Asa. Melynda Keller, [place] Patient [place] [phone] 11/09/2017 10:24 AM

## 2017-11-10 ENCOUNTER — Other Ambulatory Visit: Payer: Self-pay | Admitting: Interventional Radiology

## 2017-11-10 ENCOUNTER — Other Ambulatory Visit (HOSPITAL_COMMUNITY): Payer: Self-pay | Admitting: Interventional Radiology

## 2017-11-10 DIAGNOSIS — C61 Malignant neoplasm of prostate: Secondary | ICD-10-CM

## 2017-11-10 DIAGNOSIS — C7951 Secondary malignant neoplasm of bone: Principal | ICD-10-CM

## 2017-11-10 DIAGNOSIS — S22000A Wedge compression fracture of unspecified thoracic vertebra, initial encounter for closed fracture: Secondary | ICD-10-CM

## 2017-11-10 NOTE — Telephone Encounter (Signed)
Oral Oncology Patient Advocate Encounter  Met patient in [facility] lobby to complete application for Wynetta Emer[employer] and [employer] in an effort to reduce patient's out of pocket expense for Zytiga to $0.      Application completed and faxed to [phone].   [employer] patient assistance phone number for follow up is [phone].   This encounter will be updated until final determination.  Fabio Asa. Melynda Keller, [place] Patient [place] [phone] 11/10/2017 3:37 PM

## 2017-11-14 ENCOUNTER — Other Ambulatory Visit: Payer: Self-pay | Admitting: Radiation Oncology

## 2017-11-14 ENCOUNTER — Other Ambulatory Visit: Payer: Self-pay

## 2017-11-14 ENCOUNTER — Ambulatory Visit (HOSPITAL_COMMUNITY): Admission: RE | Admit: 2017-11-14 | Payer: Self-pay | Source: Ambulatory Visit

## 2017-11-14 DIAGNOSIS — C7951 Secondary malignant neoplasm of bone: Principal | ICD-10-CM

## 2017-11-14 DIAGNOSIS — C61 Malignant neoplasm of prostate: Secondary | ICD-10-CM

## 2017-11-14 MED ORDER — OXYCODONE HCL 5 MG PO TABS: ORAL_TABLET | ORAL | 0 refills | Status: DC

## 2017-11-14 MED FILL — oxyCODONE HCL 5 MG TABS: 5 | 3 days supply | Qty: 40 | Fill #0

## 2017-11-14 NOTE — Progress Notes (Signed)
Pt came to office upset after trying to get his MRI scan for his OsteoCool procedure with Dr. Earleen Newport today. Called over to radiology and spoke with Jonathan Campbell (MRI supervisor) to get more information about the situation. Jonathan Campbell stated that pt's Medicaid had not been approved yet. Pt was told he would still be able to get the scan, but would be responsible for the payments until Medicaid was on file and could retroactively pay. Pt became very upset and wanted Jonathan Campbell to give him a letter saying they were refusing to let him have the scan. Jonathan Campbell re-clarified with pt that he was not being denied for a scan, but that he would have to sign a paper saying he would be responsible for the bill until Medicaid was active. Per Jonathan Campbell, pt became very upset and left without signing documentation or rescheduling scan.   Ultimately upon further investigation with several other departments it was determined that pt had applied for Medicaid back in April and that it could take up to 90 days to find out the status. Jonathan Campbell (financial advocate) stated pt that had filed as a TERI case, which would hopefully expedite his case, and that she would keep pt and myself updated as she found out more information.   Another prescription for Oxycodone provided to pt per Dr. Sondra Come to help with pain in the meantime. Will continue to follow pt's progress and provide support as needed.

## 2017-11-15 ENCOUNTER — Encounter (HOSPITAL_COMMUNITY): Payer: Medicaid Other

## 2017-11-15 ENCOUNTER — Encounter (HOSPITAL_COMMUNITY)
Admission: RE | Admit: 2017-11-15 | Discharge: 2017-11-15 | Disposition: A | Discharge status: Home / Self Care | Payer: Medicaid Other | Source: Ambulatory Visit | Attending: Radiation Oncology | Admitting: Radiation Oncology

## 2017-11-15 DIAGNOSIS — C61 Malignant neoplasm of prostate: Secondary | ICD-10-CM

## 2017-11-15 DIAGNOSIS — C7951 Secondary malignant neoplasm of bone: Secondary | ICD-10-CM | POA: Insufficient documentation

## 2017-11-15 MED ORDER — TECHNETIUM TC 99M MEDRONATE IV KIT
19.9000 | PACK | Freq: Once | INTRAVENOUS | Status: AC | PRN
  Administered 2017-11-15: 19.9 via INTRAVENOUS

## 2017-11-17 ENCOUNTER — Ambulatory Visit (HOSPITAL_COMMUNITY)
Admission: RE | Admit: 2017-11-17 | Discharge: 2017-11-17 | Disposition: A | Discharge status: Home / Self Care | Payer: Medicaid Other | Source: Ambulatory Visit | Attending: Interventional Radiology | Admitting: Interventional Radiology

## 2017-11-17 ENCOUNTER — Inpatient Hospital Stay: Admission: RE | Admit: 2017-11-17 | Payer: Self-pay | Source: Ambulatory Visit | Admitting: Radiation Oncology

## 2017-11-17 ENCOUNTER — Ambulatory Visit
Admission: RE | Admit: 2017-11-17 | Discharge: 2017-11-17 | Disposition: A | Discharge status: Home / Self Care | Payer: Self-pay | Source: Ambulatory Visit | Attending: Radiation Oncology | Admitting: Radiation Oncology

## 2017-11-17 ENCOUNTER — Telehealth: Payer: Self-pay

## 2017-11-17 DIAGNOSIS — C61 Malignant neoplasm of prostate: Secondary | ICD-10-CM

## 2017-11-17 DIAGNOSIS — X58XXXA Exposure to other specified factors, initial encounter: Secondary | ICD-10-CM | POA: Diagnosis not present

## 2017-11-17 DIAGNOSIS — C7951 Secondary malignant neoplasm of bone: Secondary | ICD-10-CM | POA: Insufficient documentation

## 2017-11-17 DIAGNOSIS — S22000A Wedge compression fracture of unspecified thoracic vertebra, initial encounter for closed fracture: Secondary | ICD-10-CM

## 2017-11-17 DIAGNOSIS — C7989 Secondary malignant neoplasm of other specified sites: Secondary | ICD-10-CM

## 2017-11-17 DIAGNOSIS — C801 Malignant (primary) neoplasm, unspecified: Principal | ICD-10-CM

## 2017-11-17 MED ORDER — OXYCODONE HCL 5 MG PO TABS: ORAL_TABLET | ORAL | 0 refills | Status: DC

## 2017-11-17 MED ORDER — GADOBENATE DIMEGLUMINE 529 MG/ML IV SOLN
20.0000 mL | Freq: Once | INTRAVENOUS | Status: AC | PRN
  Administered 2017-11-17: 17 mL via INTRAVENOUS

## 2017-11-17 NOTE — Telephone Encounter (Signed)
Returned pt's call. Pt requesting more pain medication because he will run out on Monday, and will not be able to fill it since Monday is Memorial Day. Informed him that Dr. Sondra Come would like to see the pt to talk about his recent bone scan, so if he could come into the office today Dr. Sondra Come will also give him his prescription. Pt verbalized understanding and stated he will come to the office around 16:30. Appt scheduled.

## 2017-11-18 ENCOUNTER — Other Ambulatory Visit: Payer: Self-pay | Admitting: Radiology

## 2017-11-18 MED FILL — oxyCODONE HCL 5 MG TABS: 5 | 3 days supply | Qty: 40 | Fill #0

## 2017-11-18 NOTE — Telephone Encounter (Signed)
Oral Oncology Patient Advocate Encounter  Contacted [employer] to follow up on the status of the patient's application for assistance with Zytiga.   [employer] representative stated that they had no record of the application having been received.   I refaxed the application, marked urgent, along with the fax transmission confirmation from 5/16 to [employer] at [phone].  A confirmation of successful fax transmission was received.   I will continue to follow up.   Fabio Asa. Melynda Keller, [place] Patient [place] [phone] 11/18/2017 12:49 PM

## 2017-11-22 ENCOUNTER — Other Ambulatory Visit: Payer: Self-pay | Admitting: Interventional Radiology

## 2017-11-22 ENCOUNTER — Encounter (HOSPITAL_COMMUNITY): Payer: Self-pay

## 2017-11-22 ENCOUNTER — Ambulatory Visit (HOSPITAL_COMMUNITY)
Admission: RE | Admit: 2017-11-22 | Discharge: 2017-11-22 | Disposition: A | Discharge status: Home / Self Care | Payer: Medicaid Other | Source: Ambulatory Visit | Attending: Interventional Radiology | Admitting: Interventional Radiology

## 2017-11-22 DIAGNOSIS — Z9889 Other specified postprocedural states: Secondary | ICD-10-CM | POA: Diagnosis not present

## 2017-11-22 DIAGNOSIS — C7951 Secondary malignant neoplasm of bone: Principal | ICD-10-CM

## 2017-11-22 DIAGNOSIS — C61 Malignant neoplasm of prostate: Secondary | ICD-10-CM

## 2017-11-22 DIAGNOSIS — M4854XA Collapsed vertebra, not elsewhere classified, thoracic region, initial encounter for fracture: Secondary | ICD-10-CM | POA: Diagnosis not present

## 2017-11-22 DIAGNOSIS — Z79899 Other long term (current) drug therapy: Secondary | ICD-10-CM | POA: Diagnosis not present

## 2017-11-22 DIAGNOSIS — F17290 Nicotine dependence, other tobacco product, uncomplicated: Secondary | ICD-10-CM | POA: Diagnosis not present

## 2017-11-22 DIAGNOSIS — S22000A Wedge compression fracture of unspecified thoracic vertebra, initial encounter for closed fracture: Secondary | ICD-10-CM

## 2017-11-22 HISTORY — PX: IR KYPHO THORACIC WITH BONE BIOPSY: IMG5518

## 2017-11-22 HISTORY — PX: IR BONE TUMOR(S)RF ABLATION: IMG2284

## 2017-11-22 LAB — CBC
HCT: 45.9 % (ref 39.0–52.0)
Hemoglobin: 15.9 g/dL (ref 13.0–17.0)
MCH: 28.6 pg (ref 26.0–34.0)
MCHC: 34.6 g/dL (ref 30.0–36.0)
MCV: 82.6 fL (ref 78.0–100.0)
Platelets: 269 10*3/uL (ref 150–400)
RBC: 5.56 MIL/uL (ref 4.22–5.81)
RDW: 14.4 % (ref 11.5–15.5)
WBC: 6.4 10*3/uL (ref 4.0–10.5)

## 2017-11-22 LAB — APTT: aPTT: 29 seconds (ref 24–36)

## 2017-11-22 LAB — PROTIME-INR
INR: 0.91
Prothrombin Time: 12.1 seconds (ref 11.4–15.2)

## 2017-11-22 MED ORDER — CEFAZOLIN SODIUM-DEXTROSE 2-4 GM/100ML-% IV SOLN
INTRAVENOUS | Status: AC
  Filled 2017-11-22: qty 100

## 2017-11-22 MED ORDER — MIDAZOLAM HCL 2 MG/2ML IJ SOLN
INTRAMUSCULAR | Status: AC
  Filled 2017-11-22: qty 2

## 2017-11-22 MED ORDER — BUPIVACAINE HCL (PF) 0.5 % IJ SOLN
INTRAMUSCULAR | Status: AC
  Filled 2017-11-22: qty 30

## 2017-11-22 MED ORDER — FENTANYL CITRATE (PF) 100 MCG/2ML IJ SOLN
INTRAMUSCULAR | Status: AC | PRN
  Administered 2017-11-22: 50 ug via INTRAVENOUS
  Administered 2017-11-22 (×4): 25 ug via INTRAVENOUS

## 2017-11-22 MED ORDER — FENTANYL CITRATE (PF) 100 MCG/2ML IJ SOLN
INTRAMUSCULAR | Status: AC
  Filled 2017-11-22: qty 2

## 2017-11-22 MED ORDER — CEFAZOLIN SODIUM-DEXTROSE 2-4 GM/100ML-% IV SOLN: 2.0000 g | Freq: Once | INTRAVENOUS | Status: DC

## 2017-11-22 MED ORDER — BUPIVACAINE HCL (PF) 0.25 % IJ SOLN
INTRAMUSCULAR | Status: AC | PRN
  Administered 2017-11-22: 30 mL

## 2017-11-22 MED ORDER — IOPAMIDOL (ISOVUE-300) INJECTION 61%
INTRAVENOUS | Status: AC
  Administered 2017-11-22: 1 mL
  Filled 2017-11-22: qty 50

## 2017-11-22 MED ORDER — SODIUM CHLORIDE 0.9 % IV SOLN
INTRAVENOUS | Status: AC | PRN
  Administered 2017-11-22: 10 mL/h via INTRAVENOUS

## 2017-11-22 MED ORDER — MIDAZOLAM HCL 2 MG/2ML IJ SOLN
INTRAMUSCULAR | Status: AC | PRN
  Administered 2017-11-22 (×3): 0.5 mg via INTRAVENOUS
  Administered 2017-11-22: 1 mg via INTRAVENOUS
  Administered 2017-11-22: 0.5 mg via INTRAVENOUS

## 2017-11-22 MED ORDER — SODIUM CHLORIDE 0.9 % IV SOLN: INTRAVENOUS | Status: DC

## 2017-11-22 NOTE — Procedures (Signed)
Interventional Radiology Procedure Note  Procedure: Osteocool RFA of T12 pathologic fracture, with bilateral kyphplasty, cement augmentation.  Complications: None Recommendations:  - routine wound care - DC home in 2 hours - Supine at home for 4 hours - No heavy lifting (nothing >10 lbs for 5 days) - Routine care  Signed,  Dulcy Fanny. Earleen Newport, DO

## 2017-11-22 NOTE — H&P (Signed)
Chief Complaint: Patient was seen in consultation today for co-ablation therapy of T12 pathologic fracture at the request of Dr. Gery Pray  Referring Physician(s): Dr. Gery Pray  Supervising Physician: Corrie Mckusick  Patient Status: [facility] - Out-pt  History of Present Illness: Jonathan Campbell is a 57 y.o. 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 He underwent radiation therapy but continues to have pain at that site. He was seen in clinic by Dr. Earleen Newport and after repeat MRI, he was determined to be a good candidate for osteocool ablation therapy. The procedure was offered to the pt and he agreed. He is now scheduled for this procedure today. PMHx, meds, labs, imaging, allergies reviewed. Feels well, no recent fevers, chills, illness. Has been NPO today as directed. No family presently at bedside.   Past Medical History:  Diagnosis Date  . Compression fracture of T12 vertebra (HCC)   . Prostate cancer metastatic to bone [facility])     Past Surgical History:  Procedure Laterality Date  . IR RADIOLOGIST EVAL & MGMT  11/09/2017  . NO PAST SURGERIES      Allergies: Patient has no known allergies.  Medications: Prior to Admission medications   Medication Sig Start Date End Date Taking? Authorizing Provider  abiraterone acetate (ZYTIGA) 250 MG tablet Take 4 tablets (1,000 mg total) by mouth daily. Take on an empty stomach 1 hour before or 2 hours after a meal 11/08/17   Wyatt Portela, MD  acetaminophen (TYLENOL) 325 MG tablet Take 2 tablets (650 mg total) by mouth every 6 (six) hours as needed for mild pain. 09/19/17   Roxan Hockey, MD  bicalutamide (CASODEX) 50 MG tablet Take 1 tablet (50 mg total) by mouth daily. 10/25/17   Wyatt Portela, MD  methocarbamol (ROBAXIN) 750 MG tablet Take 1 tablet (750 mg total) by mouth 3 (three) times daily. 09/19/17   Roxan Hockey, MD  polyethylene glycol (MIRALAX / GLYCOLAX) packet Take 17 g by mouth daily. Use  OTC Clearax 17 g po daily Patient not taking: Reported on 11/08/2017 09/19/17   Roxan Hockey, MD  ranitidine (ZANTAC) 300 MG tablet Take 1 tablet (300 mg total) by mouth at bedtime. 09/19/17 09/19/18  Roxan Hockey, MD     History reviewed. No pertinent family history.  Social History   Socioeconomic History  . Marital status: Single    Spouse name: Not on file  . Number of children: Not on file  . Years of education: Not on file  . Highest education level: Not on file  Occupational History  . Not on file  Social Needs  . Financial resource strain: Not on file  . Food insecurity:    Worry: Not on file    Inability: Not on file  . Transportation needs:    Medical: Not on file    Non-medical: Not on file  Tobacco Use  . Smoking status: Current Some Day Smoker    Types: Cigars  . Smokeless tobacco: Never Used  Substance and Sexual Activity  . Alcohol use: Yes    Comment: occ  . Drug use: Yes    Types: Marijuana    Comment: occ  . Sexual activity: Not on file  Lifestyle  . Physical activity:    Days per week: Not on file    Minutes per session: Not on file  . Stress: Not on file  Relationships  . Social connections:    Talks on phone: Not on file    Gets  together: Not on file    Attends religious service: Not on file    Active member of club or organization: Not on file    Attends meetings of clubs or organizations: Not on file    Relationship status: Not on file  Other Topics Concern  . Not on file  Social History Narrative  . Not on file     Review of Systems: A 12 point ROS discussed and pertinent positives are indicated in the HPI above.  All other systems are negative.  Review of Systems  Vital Signs: BP 118/79   Pulse 72   Temp 98.3 F (36.8 C)   Resp 18   Ht 5\' 9"  (1.753 m)   Wt 174 lb (78.9 kg)   SpO2 100%   BMI 25.70 kg/m   Physical Exam  Constitutional: He is oriented to person, place, and time. He appears well-developed. No distress.    HENT:  Head: Normocephalic.  Mouth/Throat: Oropharynx is clear and moist.  Neck: Normal range of motion. No JVD present. No tracheal deviation present.  Cardiovascular: Normal rate, regular rhythm and normal heart sounds.  Pulmonary/Chest: Effort normal and breath sounds normal. No respiratory distress.  Musculoskeletal: He exhibits no deformity.  Mildly tender about the thoracolumbar region  Neurological: He is alert and oriented to person, place, and time.  Skin: Skin is warm and dry.  Psychiatric: He has a normal mood and affect.    Imaging: Mr Thoracic Spine W Wo Contrast  Result Date: 11/17/2017 CLINICAL DATA:  Metastatic prostate cancer. Thoracic compression fracture. EXAM: MRI THORACIC WITHOUT AND WITH CONTRAST TECHNIQUE: Multiplanar and multiecho pulse sequences of the thoracic spine were obtained without and with intravenous contrast. CONTRAST:  8mL MULTIHANCE GADOBENATE DIMEGLUMINE 529 MG/ML IV SOLN COMPARISON:  Thoracic MRI 09/13/2017, CT chest 09/13/2017 FINDINGS: MRI THORACIC SPINE FINDINGS Alignment:  Normal Image quality degraded by moderate motion. Vertebrae: Pathologic fracture T12 is mild and unchanged in degree. Diffuse bone marrow infiltration throughout T12 and extending into the right pedicle as noted previously. Ventral epidural tumor has improved since the prior study. Multiple small lesions in the bone marrow throughout the thoracic spine are better seen on the prior study given the amount of motion on today's study. Cord: Negative for cord compression. Normal cord signal. Limited cord evaluation due to motion. Paraspinal and other soft tissues: Numerous hepatic cysts. No paraspinous soft tissue mass Disc levels: Mild disc degeneration midthoracic spine.  No disc protrusion IMPRESSION: Pathologic fracture of T12. Interval improvement in ventral epidural tumor on the right compared to the prior study due to treatment. Multiple small bone marrow lesions in the thoracic  vertebra better seen on the prior study. Current study is degraded by motion. No new pathologic fracture.  No cord compression. Electronically Signed   By: Franchot Gallo M.D.   On: 11/17/2017 11:39   Nm Bone Scan Whole Body  Result Date: 11/15/2017 CLINICAL DATA:  Persistent back pain for greater than 6 weeks despite conservative therapy, history prostate cancer with bone metastases, restaging, history T12 compression fracture EXAM: NUCLEAR MEDICINE WHOLE BODY BONE SCAN TECHNIQUE: Whole body anterior and posterior images were obtained approximately 3 hours after intravenous injection of radiopharmaceutical. RADIOPHARMACEUTICALS:  19.9 mCi Technetium-71m MDP IV COMPARISON:  None Radiographic correlation: CT chest and MR chest exams of 09/13/2017, CT abdomen and pelvis 09/12/2017 FINDINGS: Uptake at T12 vertebral body corresponding to known T12 metastasis with pathologic fracture. Also identified increased uptake in lower lumbar spine at L4 at/LEFT of midline  corresponding to lytic metastasis in the posterior elements on prior CT. Additional foci of abnormal uptake at LEFT sacrum, a lower lateral LEFT rib, and LEFT scapula. Scattered degenerative type uptake at shoulders, elbows, wrists, knees and feet. No additional sites of abnormal osseous tracer accumulation identified. Expected urinary tract and soft tissue distribution of tracer. IMPRESSION: Multiple sites of abnormal increased tracer localization at a lower lateral LEFT rib, at T12 and L4 vertebral bodies, sacrum, and LEFT scapula consistent with osseous metastases. Electronically Signed   By: Lavonia Dana M.D.   On: 11/15/2017 14:47   Ir Radiologist Eval & Mgmt  Result Date: 11/09/2017 Please refer to notes tab for details about interventional procedure. (Op Note)   Labs:  CBC: Recent Labs    09/17/17 0345 09/18/17 0341 11/08/17 1234 11/22/17 1152  WBC 14.8* 14.8* 8.8 6.4  HGB 15.7 15.5 15.1 15.9  HCT 45.1 44.1 44.2 45.9  PLT 324 334 232  269    COAGS: Recent Labs    09/14/17 1230 11/22/17 1152  INR 0.96 0.91  APTT  --  29    BMP: Recent Labs    09/13/17 0622 09/15/17 0753 09/17/17 0345 09/19/17 0426 11/08/17 1234  NA 136 135 136  --  141  K 4.1 4.4 4.7  --  4.4  CL 103 105 108  --  106  CO2 24 20* 19*  --  29  GLUCOSE 101* 117* 137*  --  111  BUN 18 19 28*  --  13  CALCIUM 8.7* 9.0 9.1  --  10.2  CREATININE 1.32* 1.06 1.17 1.15 1.45*  GFRNONAA 59* >60 >60 >60 52*  GFRAA >60 >60 >60 >60 >60    LIVER FUNCTION TESTS: Recent Labs    09/12/17 1555 09/13/17 0622 09/15/17 0753 11/08/17 1234  BILITOT 1.2 0.8 1.1 0.6  AST 28 19 19 30   ALT 23 18 17  34  ALKPHOS 106 98 108 147  PROT 6.9 6.6 7.2 8.0  ALBUMIN 3.5 3.1* 3.3* 3.9    TUMOR MARKERS: No results for input(s): AFPTM, CEA, CA199, CHROMGRNA in the last 8760 hours.  Assessment and Plan: Metastatic prostate cancer Pathologic T12 fracture Plan for Osteocool ablation followed by augmentation. Labs ok Risks and benefits of co-ablation of T12 were discussed with the patient including, but not limited to education regarding the natural healing process of compression fractures without intervention, bleeding, infection, cement migration which may cause spinal cord damage, paralysis, pulmonary embolism or even death.  This interventional procedure involves the use of X-rays and because of the nature of the planned procedure, it is possible that we will have prolonged use of X-ray fluoroscopy.  Potential radiation risks to you include (but are not limited to) the following: - A slightly elevated risk for cancer  several years later in life. This risk is typically less than 0.5% percent. This risk is low in comparison to the normal incidence of human cancer, which is 33% for women and 50% for men according to the [place]. - Radiation induced injury can include skin redness, resembling a rash, tissue breakdown / ulcers and hair loss (which  can be temporary or permanent).   The likelihood of either of these occurring depends on the difficulty of the procedure and whether you are sensitive to radiation due to previous procedures, disease, or genetic conditions.   IF your procedure requires a prolonged use of radiation, you will be notified and given written instructions for further action.  It is  your responsibility to monitor the irradiated area for the 2 weeks following the procedure and to notify your physician if you are concerned that you have suffered a radiation induced injury.    All of the patient's questions were answered, patient is agreeable to proceed.  Consent signed and in chart.   Thank you for this interesting consult.  I greatly enjoyed meeting Jonathan Campbell and look forward to participating in their care.  A copy of this report was sent to the requesting provider on this date.  Electronically Signed: Ascencion Dike, PA-C 11/22/2017, 12:53 PM   I spent a total of 20 minutes in face to face in clinical consultation, greater than 50% of which was counseling/coordinating care for T12 co-ablation tx

## 2017-11-22 NOTE — Discharge Instructions (Signed)
KYPHOPLASTY/VERTEBROPLASTY DISCHARGE INSTRUCTIONS  Medications: (check all that apply)     Resume all home medications as before procedure.                     Continue your pain medications as prescribed as needed.  Over the next 3-5 days, decrease your pain medication as tolerated.  Over the counter medications (i.e. Tylenol, ibuprofen, and aleve) may be substituted once severe/moderate pain symptoms have subsided.   Wound Care: - Bandages may be removed the day following your procedure.  You may get your incision wet once bandages are removed.  Bandaids may be used to cover the incisions until scab formation.  Topical ointments are optional.  - If you develop a fever greater than 101 degrees, have increased skin redness at the incision sites or pus-like oozing from incisions occurring within 1 week of the procedure, contact radiology at [phone] or [phone].  - Ice pack to back for 15-20 minutes 2-3 time per day for first 2-3 days post procedure.  The ice will expedite muscle healing and help with the pain from the incisions.   Activity: - Bedrest today with limited activity for 24 hours post procedure.  - No driving for 48 hours.  - Increase your activity as tolerated after bedrest (with assistance if necessary).  - Refrain from any strenuous activity or heavy lifting (greater than 10 lbs.).   Follow up: - Contact radiology at [phone] or [phone] if any questions/concerns.  - A physician assistant from radiology will contact you in approximately 1 week.  - If a biopsy was performed at the time of your procedure, your referring physician should receive the results in usually 2-3 days.        Remain supine 4 hours when you get home

## 2017-11-23 ENCOUNTER — Ambulatory Visit: Payer: Medicaid Other | Admitting: Radiation Oncology

## 2017-11-23 ENCOUNTER — Other Ambulatory Visit: Payer: Self-pay | Admitting: Radiation Oncology

## 2017-11-23 ENCOUNTER — Telehealth: Payer: Self-pay

## 2017-11-23 MED ORDER — OXYCODONE HCL 5 MG PO TABS: 5.0000 mg | ORAL_TABLET | ORAL | 0 refills | Status: DC | PRN

## 2017-11-23 MED FILL — IBUPROFEN 800 MG TAB: 800 | 3 days supply | Qty: 9 | Fill #0

## 2017-11-23 MED FILL — oxyCODONE HCL 5 MG TABS: 5 | 10 days supply | Qty: 60 | Fill #0

## 2017-11-23 NOTE — Telephone Encounter (Signed)
Pt called concerned about back pain and inability to get out of bed since back surgery yesterday. Requesting refill of pain medication. Per Dr. Sondra Come, he will refill pt's Oxycodone prescription through the weekend, and then see the pt for a re-consult on Monday. Instructed pt on plan, who stated his fiance would be able to pick up the prescription by 17:15.

## 2017-11-24 NOTE — Progress Notes (Signed)
Histology and Location of Primary Cancer: Prostate cancer metastatic to bone   Sites of Visceral and Bony Metastatic Disease: Multiple small  11/15/2017:  -Whole Body Scan Lateral LEFT rib, at T12 and L4 vertebral bodies, sacrum, and LEFT scapula consistent with osseous metastases, Additional foci of abnormal uptake at LEFT sacrum, a lower lateral LEFT rib, and LEFT scapula 11/17/2017:  -MRI Thoracic Spine  Bone marrow lesions in the thoracic vertebra; Pathologic fracture of T12  Location(s) of Symptomatic Metastases:  Pathologic fracture of T12  Past/Anticipated chemotherapy by medical oncology, if any: Dr. Zola Button:   Radiation onco;ogy referal Received the first Lupron injection (10/05/2017) and completed 1 months of Casodex.  Referred to interventional radiology for possible intervention to relieve the pain.    Pain on a scale of 0-10 is: 5/10 Lower back left side taking oxycodone  If Spine Met(s), symptoms, if any, include:Yes  Bowel/Bladder retention or incontinence (please describe): Having daily normal bowel movements, voiding frequently with some urgency.  Numbness or weakness in extremities (please describe):No reports tightness and aching.  Current Decadron regimen, if applicable: None  Ambulatory status? Walker? Wheelchair?: Ambulatory with back brace  SAFETY ISSUES:  Prior radiation? 09/16/2017--09/29/2017 to complete 230 Gy in 10 fractions to thoracic spine  Pacemaker/ICD? No  Possible current pregnancy? No  Is the patient on methotrexate? No  Current Complaints / other details:   On 11/22/2017 Dr. Damita Dunnings performed Osteocool RFA of T12 pathologic fracture, with bilateral kyphplasty, cement augmentation.  Wt Readings from Last 3 Encounters:  11/28/17 173 lb (78.5 kg)  11/22/17 174 lb (78.9 kg)  11/09/17 174 lb (78.9 kg)  BP 109/70 (BP Location: Right Arm, Patient Position: Sitting, Cuff Size: Normal)   Pulse 76   Temp 98.5 F (36.9 C) (Oral)    Resp 18   Ht 5' 9" (1.753 m)   Wt 173 lb (78.5 kg)   SpO2 99%   BMI 25.55 kg/m

## 2017-11-25 ENCOUNTER — Telehealth: Payer: Self-pay | Admitting: *Deleted

## 2017-11-25 NOTE — Telephone Encounter (Signed)
Returned patient's phone call regarding Zytiga. Patient stated,"I haven't heard about my medicine." Instructed patient that we are still waiting for [facility] Wynetta Emery to approve the application. He verbalized understanding.

## 2017-11-28 ENCOUNTER — Ambulatory Visit
Admission: RE | Admit: 2017-11-28 | Discharge: 2017-11-28 | Disposition: A | Discharge status: Home / Self Care | Payer: Medicaid Other | Source: Ambulatory Visit | Attending: Radiation Oncology | Admitting: Radiation Oncology

## 2017-11-28 VITALS — BP 109/70 | HR 76 | Temp 98.5°F | Resp 18 | Ht 69.0 in | Wt 173.0 lb

## 2017-11-28 DIAGNOSIS — Z510 Encounter for antineoplastic radiation therapy: Secondary | ICD-10-CM | POA: Diagnosis present

## 2017-11-28 DIAGNOSIS — C7951 Secondary malignant neoplasm of bone: Secondary | ICD-10-CM | POA: Diagnosis not present

## 2017-11-28 DIAGNOSIS — C61 Malignant neoplasm of prostate: Secondary | ICD-10-CM

## 2017-11-28 DIAGNOSIS — M25512 Pain in left shoulder: Secondary | ICD-10-CM | POA: Insufficient documentation

## 2017-11-28 DIAGNOSIS — K7689 Other specified diseases of liver: Secondary | ICD-10-CM | POA: Diagnosis not present

## 2017-11-28 DIAGNOSIS — M545 Low back pain: Secondary | ICD-10-CM | POA: Insufficient documentation

## 2017-11-28 DIAGNOSIS — Z79899 Other long term (current) drug therapy: Secondary | ICD-10-CM | POA: Insufficient documentation

## 2017-11-28 MED ORDER — OXYCODONE HCL 10 MG PO TABS: 10.0000 mg | ORAL_TABLET | Freq: Four times a day (QID) | ORAL | 0 refills | Status: DC

## 2017-11-28 MED FILL — oxyCODONE HCL 10 MG TABS: 10 | 15 days supply | Qty: 60 | Fill #0

## 2017-11-28 NOTE — Progress Notes (Signed)
  Radiation Oncology         [phone] ________________________________  Name: Lyriq Jarchow MRN: [id number]  Date: 11/28/2017  DOB: 01-Aug-1960  SIMULATION AND TREATMENT PLANNING NOTE   DIAGNOSIS: Metastatic prostate cancer  NARRATIVE:  The patient was brought to the [facility].  Identity was confirmed.  All relevant records and images related to the planned course of therapy were reviewed.  The patient freely provided informed written consent to proceed with treatment after reviewing the details related to the planned course of therapy. The consent form was witnessed and verified by the simulation staff.  Then, the patient was set-up in a stable reproducible  supine position for radiation therapy.  CT images were obtained.  Surface markings were placed.  The CT images were loaded into the planning software.  Then the target and avoidance structures were contoured.  Treatment planning then occurred.  The radiation prescription was entered and confirmed.  Then, I designed and supervised the construction of a total of 7 medically necessary complex treatment devices.  I have requested : 3D Simulation  I have requested a DVH of the following structures: GTV, spinal cord, kidneys.  I have ordered:dose calc.  PLAN:  The patient will receive 30 Gy in 10 fractions directed at the left scapula and 30 Gy in 10 fractions directed at the L4 region.  -----------------------------------  Blair Promise, PhD, MD  This document serves as a record of services personally performed by Gery Pray, MD. It was created on his behalf by Bethann Humble, a trained medical scribe. The creation of this record is based on the scribe's personal observations and the provider's statements to them. This document has been checked and approved by the attending provider.

## 2017-11-28 NOTE — Progress Notes (Signed)
Radiation Oncology         [phone] ________________________________  Name: Jonathan Campbell MRN: [id number]  Date: 11/28/2017  DOB: 02/13/1961  Re-Evaluation Note  CC: Patient, No Pcp Per  No ref. provider found    ICD-10-CM   1. Prostate cancer metastatic to bone [facility]) C61    C79.51     Diagnosis:   Metastatic prostate cancer  Interval Since Last Radiation: 2 months 09/16/17-09/29/17: 30 Gy in 10 fractions to the T-spine  Narrative:  The patient returns today for re-evaluation.  He continues to have a lot of pain in his lower back left side. He grades this pain as 5/10 and is managing the pain with Oxycodone. The patient received his first Lupron injection on 10/05/17 and he has completed 1 month of Casodex. He underwent whole body bone scan on 11/15/17. This revealed multiple sites of abnormal increased tracer localization at a lower lateral left rib, at T12 and L4 vertebral bodies, sacrum, and left scapula consistent with osseous metastases. The patient then proceeded to undergo MRI of the thoracic spine on 11/17/17. This showed a pathologic fracture of T12. Interval improvement in ventral epidural tumor on the right compared to prior study due to treatment. Multiple small bone marrow lesions in the thoracic vertebra better seen on prior study. Current study was degraded by motion. No new pathologic fracture or cord compression. The patient then underwent Osteocool RFA of T12 pathologic fracture, with bilateral kyphoplasty, with cement augmentation on 11/22/17 under the care of Dr. Earleen Newport. The patient reports his upper back pain has resolved since the procedure. He reports worsening pain to the low back radiating to the bilateral upper legs since the procedure.   The patient reports low back pain radiating to the legs. He also notes left shoulder pain. He notes voiding frequently with some urgency. He denies abnormal bowel movements, or numbness or weakness in extremities. He is ambulatory  with a back brace.        ALLERGIES:  has No Known Allergies.  Meds: Current Outpatient Medications  Medication Sig Dispense Refill  . abiraterone acetate (ZYTIGA) 250 MG tablet Take 4 tablets (1,000 mg total) by mouth daily. Take on an empty stomach 1 hour before or 2 hours after a meal 120 tablet 0  . bicalutamide (CASODEX) 50 MG tablet Take 1 tablet (50 mg total) by mouth daily. 30 tablet 0  . ranitidine (ZANTAC) 300 MG tablet Take 1 tablet (300 mg total) by mouth at bedtime. 30 tablet 1  . acetaminophen (TYLENOL) 325 MG tablet Take 2 tablets (650 mg total) by mouth every 6 (six) hours as needed for mild pain. (Patient not taking: Reported on 11/28/2017) 120 tablet 0  . methocarbamol (ROBAXIN) 750 MG tablet Take 1 tablet (750 mg total) by mouth 3 (three) times daily. (Patient not taking: Reported on 11/28/2017) 90 tablet 2  . Oxycodone HCl 10 MG TABS Take 1 tablet (10 mg total) by mouth 4 (four) times daily. As needed for severe pain 60 tablet 0  . polyethylene glycol (MIRALAX / GLYCOLAX) packet Take 17 g by mouth daily. Use OTC Clearax 17 g po daily (Patient not taking: Reported on 11/28/2017) 30 each 2   No current facility-administered medications for this encounter.     Physical Findings: The patient is in no acute distress. Patient is alert and oriented.  height is 5\' 9"  (1.753 m) and weight is 173 lb (78.5 kg). His oral temperature is 98.5 F (36.9 C). His blood pressure  is 109/70 and his pulse is 76. His respiration is 18 and oxygen saturation is 99%. .  No significant changes. He continues to wear a back brace all of waking hours. He has too much back pain when he tries to walk without the brace. Lungs are clear to auscultation bilaterally. Heart has regular rate and rhythm. No palpable cervical, supraclavicular, or axillary adenopathy. Abdomen soft, non-tender, normal bowel sounds. Pain with palpation to the left shoulder and lumbar spine. Symmetric strength and muscle tone throughout. No  obvious focalities. Speech is fluent. Coordination is intact.  Lab Findings: Lab Results  Component Value Date   WBC 6.4 11/22/2017   HGB 15.9 11/22/2017   HCT 45.9 11/22/2017   MCV 82.6 11/22/2017   PLT 269 11/22/2017    Radiographic Findings: Mr Thoracic Spine W Wo Contrast  Result Date: 11/17/2017 CLINICAL DATA:  Metastatic prostate cancer. Thoracic compression fracture. EXAM: MRI THORACIC WITHOUT AND WITH CONTRAST TECHNIQUE: Multiplanar and multiecho pulse sequences of the thoracic spine were obtained without and with intravenous contrast. CONTRAST:  56mL MULTIHANCE GADOBENATE DIMEGLUMINE 529 MG/ML IV SOLN COMPARISON:  Thoracic MRI 09/13/2017, CT chest 09/13/2017 FINDINGS: MRI THORACIC SPINE FINDINGS Alignment:  Normal Image quality degraded by moderate motion. Vertebrae: Pathologic fracture T12 is mild and unchanged in degree. Diffuse bone marrow infiltration throughout T12 and extending into the right pedicle as noted previously. Ventral epidural tumor has improved since the prior study. Multiple small lesions in the bone marrow throughout the thoracic spine are better seen on the prior study given the amount of motion on today's study. Cord: Negative for cord compression. Normal cord signal. Limited cord evaluation due to motion. Paraspinal and other soft tissues: Numerous hepatic cysts. No paraspinous soft tissue mass Disc levels: Mild disc degeneration midthoracic spine.  No disc protrusion IMPRESSION: Pathologic fracture of T12. Interval improvement in ventral epidural tumor on the right compared to the prior study due to treatment. Multiple small bone marrow lesions in the thoracic vertebra better seen on the prior study. Current study is degraded by motion. No new pathologic fracture.  No cord compression. Electronically Signed   By: Franchot Gallo M.D.   On: 11/17/2017 11:39   Nm Bone Scan Whole Body  Result Date: 11/15/2017 CLINICAL DATA:  Persistent back pain for greater than 6  weeks despite conservative therapy, history prostate cancer with bone metastases, restaging, history T12 compression fracture EXAM: NUCLEAR MEDICINE WHOLE BODY BONE SCAN TECHNIQUE: Whole body anterior and posterior images were obtained approximately 3 hours after intravenous injection of radiopharmaceutical. RADIOPHARMACEUTICALS:  19.9 mCi Technetium-46m MDP IV COMPARISON:  None Radiographic correlation: CT chest and MR chest exams of 09/13/2017, CT abdomen and pelvis 09/12/2017 FINDINGS: Uptake at T12 vertebral body corresponding to known T12 metastasis with pathologic fracture. Also identified increased uptake in lower lumbar spine at L4 at/LEFT of midline corresponding to lytic metastasis in the posterior elements on prior CT. Additional foci of abnormal uptake at LEFT sacrum, a lower lateral LEFT rib, and LEFT scapula. Scattered degenerative type uptake at shoulders, elbows, wrists, knees and feet. No additional sites of abnormal osseous tracer accumulation identified. Expected urinary tract and soft tissue distribution of tracer. IMPRESSION: Multiple sites of abnormal increased tracer localization at a lower lateral LEFT rib, at T12 and L4 vertebral bodies, sacrum, and LEFT scapula consistent with osseous metastases. Electronically Signed   By: Lavonia Dana M.D.   On: 11/15/2017 14:47   Ir Bone Tumor(s)rf Ablation  Result Date: 11/22/2017 INDICATION: 57 year old male with metastatic  prostate carcinoma and a pathologic T12 compression fracture. EXAM: RADIOFREQUENCY ABLATION OF T12 METASTATIC TUMOR WITH OSTEO COOL VERTEBRAL AUGMENTATION WITH KYPHOPLASTY VIA BIPEDICULAR APPROACH COMPARISON:  MR 11/17/2017, 09/13/2017 MEDICATIONS: As antibiotic prophylaxis, 2.0 g Ancef was ordered pre-procedure and administered intravenously within 1 hour of incision. ANESTHESIA/SEDATION: Moderate (conscious) sedation was employed during this procedure. A total of Versed 3.0 mg and Fentanyl 150 mcg was administered  intravenously. Moderate Sedation Time: 54 minutes. The patient's level of consciousness and vital signs were monitored continuously by radiology nursing throughout the procedure under my direct supervision. FLUOROSCOPY TIME:  Fluoroscopy Time: 8 minutes 48 seconds (564 mGy) COMPLICATIONS: None PROCEDURE: Following a full explanation of the procedure along with the potentially associated complications, a witnessed informed consent was obtained. Specific risks that were discussed included bleeding, infection, injury to adjacent structures, neurologic injury, embolization of cement within the veins, failure of the procedure to improve pain, need for further procedure/ surgery, cardiopulmonary collapse, death. The patient understands the risks and wishes to proceed. The patient was placed prone on the fluoroscopic table. Nasal oxygen was administered. Physiologic monitoring was performed throughout the duration of the procedure. The skin overlying the T12 region was prepped and draped in the usual sterile fashion. The T12 vertebral body was identified and the right pedicle was infiltrated with 1% lidocaine. This was then followed by the advancement of Medtronic Osteocool Trocar needle through the right pedicle into the anterior one-third. The left T12 pedicle was then infiltrated with 1% lidocaine. A small incision was made with 11 blade scalpel, and then a Medtronic Osteocool Trocar needle was advanced through the left pedicle into the anterior 1/3 of the vertebral body. The bone drill/measuring tool was then advanced through the bilateral pedicles to estimate length of the required Osteocool ablation device. 15 mm device selected for the right pedicle and 15 mm device selected for the left pedicle. Once position was confirmed, ablation was initiated for total of 11 minutes left, 11 minutes right. The RFA ablation devices were then removed. Balloon cannula were advanced bilaterally. Simultaneous inflation of the left  and right pedicular cannula balloons was performed under fluoroscopic observation. Methylmethacrylate mixture was then reconstituted. Under biplane intermittent fluoroscopy, the methylmethacrylate was then injected into the cavity at the posterior T12 vertebral body with filling of the space. Trace epidural venous contamination was identified. The needles were then removed. Hemostasis was achieved at the skin entry site. There were no acute complications. Patient tolerated the procedure well. The patient was observed for 30 minutes and returned to room in good condition. IMPRESSION: Status post radiofrequency ablation of T12 pathologic fracture/metastatic prostate carcinoma, with bipedicular kyphoplasty. Signed, Dulcy Fanny. Earleen Newport, DO [facility] Radiology Electronically Signed   By: Corrie Mckusick D.O.   On: 11/22/2017 16:57   Ir Kypho Thoracic With Bone Biopsy  Result Date: 11/22/2017 INDICATION: 57 year old male with metastatic prostate carcinoma and a pathologic T12 compression fracture. EXAM: RADIOFREQUENCY ABLATION OF T12 METASTATIC TUMOR WITH OSTEO COOL VERTEBRAL AUGMENTATION WITH KYPHOPLASTY VIA BIPEDICULAR APPROACH COMPARISON:  MR 11/17/2017, 09/13/2017 MEDICATIONS: As antibiotic prophylaxis, 2.0 g Ancef was ordered pre-procedure and administered intravenously within 1 hour of incision. ANESTHESIA/SEDATION: Moderate (conscious) sedation was employed during this procedure. A total of Versed 3.0 mg and Fentanyl 150 mcg was administered intravenously. Moderate Sedation Time: 54 minutes. The patient's level of consciousness and vital signs were monitored continuously by radiology nursing throughout the procedure under my direct supervision. FLUOROSCOPY TIME:  Fluoroscopy Time: 8 minutes 48  seconds (712 mGy) COMPLICATIONS: None PROCEDURE: Following a full explanation of the procedure along with the potentially associated complications, a witnessed informed consent  was obtained. Specific risks that were discussed included bleeding, infection, injury to adjacent structures, neurologic injury, embolization of cement within the veins, failure of the procedure to improve pain, need for further procedure/ surgery, cardiopulmonary collapse, death. The patient understands the risks and wishes to proceed. The patient was placed prone on the fluoroscopic table. Nasal oxygen was administered. Physiologic monitoring was performed throughout the duration of the procedure. The skin overlying the T12 region was prepped and draped in the usual sterile fashion. The T12 vertebral body was identified and the right pedicle was infiltrated with 1% lidocaine. This was then followed by the advancement of Medtronic Osteocool Trocar needle through the right pedicle into the anterior one-third. The left T12 pedicle was then infiltrated with 1% lidocaine. A small incision was made with 11 blade scalpel, and then a Medtronic Osteocool Trocar needle was advanced through the left pedicle into the anterior 1/3 of the vertebral body. The bone drill/measuring tool was then advanced through the bilateral pedicles to estimate length of the required Osteocool ablation device. 15 mm device selected for the right pedicle and 15 mm device selected for the left pedicle. Once position was confirmed, ablation was initiated for total of 11 minutes left, 11 minutes right. The RFA ablation devices were then removed. Balloon cannula were advanced bilaterally. Simultaneous inflation of the left and right pedicular cannula balloons was performed under fluoroscopic observation. Methylmethacrylate mixture was then reconstituted. Under biplane intermittent fluoroscopy, the methylmethacrylate was then injected into the cavity at the posterior T12 vertebral body with filling of the space. Trace epidural venous contamination was identified. The needles were then removed. Hemostasis was achieved at the skin entry site. There were  no acute complications. Patient tolerated the procedure well. The patient was observed for 30 minutes and returned to room in good condition. IMPRESSION: Status post radiofrequency ablation of T12 pathologic fracture/metastatic prostate carcinoma, with bipedicular kyphoplasty. Signed, Dulcy Fanny. Earleen Newport, DO [facility] Radiology Electronically Signed   By: Corrie Mckusick D.O.   On: 11/22/2017 16:57   Ir Radiologist Eval & Mgmt  Result Date: 11/09/2017 Please refer to notes tab for details about interventional procedure. (Op Note)   Impression:  Metastatic prostate cancer. Patient's pain medication was refilled today and increased to 10 mg every 6 hours. The patient is still symptomatic from his T12 area, but his pain has improved. He continues to have a significant amount of pain in the lower back region and left shoulder area consistent with his lesions noted on bone scan. The patient would be a good candidate for a short course of palliative radiation therapy directed at the L4 and left scapula region. We discussed the course of treatment, side effects, and potential long term toxicities of treatment. I anticipate 10 treatments to the L4 and left scapula. The patient appears to understand and wishes to proceed. A consent form was signed and a copy was placed in the patient's medical record.   Plan: The patient will proceed with CT simulation and treatment planning at 9 AM this morning. Anticipate treatment to begin later this week.   ____________________________________  This document serves as a record of services personally performed by Gery Pray, MD. It was created on his behalf by Bethann Humble, a trained medical scribe. The creation of this record is based on the scribe's personal observations and  the provider's statements to them. This document has been checked and approved by the attending provider.

## 2017-11-29 ENCOUNTER — Other Ambulatory Visit: Payer: Self-pay | Admitting: Interventional Radiology

## 2017-11-29 DIAGNOSIS — C7951 Secondary malignant neoplasm of bone: Principal | ICD-10-CM

## 2017-11-29 DIAGNOSIS — C61 Malignant neoplasm of prostate: Secondary | ICD-10-CM

## 2017-11-29 NOTE — Telephone Encounter (Signed)
Oral Oncology Patient Advocate Encounter  Received notification from [place] [employer] [place] Patient Assistance program that patient has been successfully enrolled into their program to receive Zytiga from the manufacturer at $0 out of pocket until 11/26/2018.   I called and left a message for the patient.   Oral Oncology Clinic will continue to follow.  Gilmore Laroche, CPhT, [facility] Patient Advocate [phone] 11/29/2017 9:28 AM

## 2017-11-30 DIAGNOSIS — Z510 Encounter for antineoplastic radiation therapy: Secondary | ICD-10-CM | POA: Diagnosis not present

## 2017-12-01 ENCOUNTER — Encounter: Payer: Self-pay | Admitting: *Deleted

## 2017-12-01 ENCOUNTER — Ambulatory Visit
Admission: RE | Admit: 2017-12-01 | Discharge: 2017-12-01 | Disposition: A | Discharge status: Home / Self Care | Payer: Medicaid Other | Source: Ambulatory Visit | Attending: Radiation Oncology | Admitting: Radiation Oncology

## 2017-12-01 DIAGNOSIS — Z510 Encounter for antineoplastic radiation therapy: Secondary | ICD-10-CM | POA: Diagnosis not present

## 2017-12-01 DIAGNOSIS — C61 Malignant neoplasm of prostate: Secondary | ICD-10-CM

## 2017-12-01 DIAGNOSIS — C7951 Secondary malignant neoplasm of bone: Principal | ICD-10-CM

## 2017-12-01 MED ORDER — OXYCODONE HCL 10 MG PO TABS: 10.0000 mg | ORAL_TABLET | Freq: Four times a day (QID) | ORAL | 0 refills | Status: DC

## 2017-12-01 MED FILL — oxyCODONE HCL 10 MG TABS: 10 | 15 days supply | Qty: 60 | Fill #0
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
## 2017-12-01 NOTE — Progress Notes (Signed)
  Radiation Oncology         [phone] ________________________________  Name: Devyon Keator MRN: [id number]  Date: 12/01/2017  DOB: 05/31/1961  Simulation Verification Note    ICD-10-CM   1. Prostate cancer metastatic to bone [facility]) C61    C79.51     Status: outpatient  NARRATIVE: The patient was brought to the treatment unit and placed in the planned treatment position. The clinical setup was verified. Then port films were obtained and uploaded to the radiation oncology medical record software.  The treatment beams were carefully compared against the planned radiation fields. The position location and shape of the radiation fields was reviewed. They targeted volume of tissue appears to be appropriately covered by the radiation beams. Organs at risk appear to be excluded as planned.  Based on my personal review, I approved the simulation verification. The patient's treatment will proceed as planned.  -----------------------------------  Blair Promise, PhD, MD  This document serves as a record of services personally performed by Gery Pray, MD. It was created on his behalf by Wilburn Mylar, a trained medical scribe. The creation of this record is based on the scribe's personal observations and the provider's statements to them. This document has been checked and approved by the attending provider.

## 2017-12-02 ENCOUNTER — Ambulatory Visit
Admission: RE | Admit: 2017-12-02 | Discharge: 2017-12-02 | Disposition: A | Discharge status: Home / Self Care | Payer: Medicaid Other | Source: Ambulatory Visit | Attending: Radiation Oncology | Admitting: Radiation Oncology

## 2017-12-02 DIAGNOSIS — Z510 Encounter for antineoplastic radiation therapy: Secondary | ICD-10-CM | POA: Diagnosis not present

## 2017-12-05 ENCOUNTER — Ambulatory Visit
Admission: RE | Admit: 2017-12-05 | Discharge: 2017-12-05 | Disposition: A | Discharge status: Home / Self Care | Payer: Medicaid Other | Source: Ambulatory Visit | Attending: Radiation Oncology | Admitting: Radiation Oncology

## 2017-12-05 DIAGNOSIS — Z510 Encounter for antineoplastic radiation therapy: Secondary | ICD-10-CM | POA: Diagnosis not present

## 2017-12-06 ENCOUNTER — Ambulatory Visit
Admission: RE | Admit: 2017-12-06 | Discharge: 2017-12-06 | Disposition: A | Discharge status: Home / Self Care | Payer: Medicaid Other | Source: Ambulatory Visit | Attending: Radiation Oncology | Admitting: Radiation Oncology

## 2017-12-06 ENCOUNTER — Other Ambulatory Visit: Payer: Self-pay | Admitting: Oncology

## 2017-12-06 ENCOUNTER — Other Ambulatory Visit: Payer: Self-pay | Admitting: Radiation Oncology

## 2017-12-06 DIAGNOSIS — C7989 Secondary malignant neoplasm of other specified sites: Secondary | ICD-10-CM

## 2017-12-06 DIAGNOSIS — Z510 Encounter for antineoplastic radiation therapy: Secondary | ICD-10-CM | POA: Diagnosis not present

## 2017-12-06 DIAGNOSIS — C801 Malignant (primary) neoplasm, unspecified: Principal | ICD-10-CM

## 2017-12-06 MED ORDER — RADIAPLEXRX EX GEL
Freq: Two times a day (BID) | CUTANEOUS | Status: DC
  Administered 2017-12-06: 17:00:00 via TOPICAL

## 2017-12-06 MED ORDER — OXYCODONE HCL 10 MG PO TABS: 10.0000 mg | ORAL_TABLET | Freq: Four times a day (QID) | ORAL | 0 refills | Status: DC

## 2017-12-06 MED FILL — oxyCODONE HCL 10 MG TABS: 10 | 15 days supply | Qty: 60 | Fill #0

## 2017-12-07 ENCOUNTER — Ambulatory Visit
Admission: RE | Admit: 2017-12-07 | Discharge: 2017-12-07 | Disposition: A | Discharge status: Home / Self Care | Payer: Medicaid Other | Source: Ambulatory Visit | Attending: Radiation Oncology | Admitting: Radiation Oncology

## 2017-12-07 DIAGNOSIS — Z510 Encounter for antineoplastic radiation therapy: Secondary | ICD-10-CM | POA: Diagnosis not present

## 2017-12-08 ENCOUNTER — Other Ambulatory Visit: Payer: Self-pay

## 2017-12-08 ENCOUNTER — Ambulatory Visit
Admission: RE | Admit: 2017-12-08 | Discharge: 2017-12-08 | Disposition: A | Discharge status: Home / Self Care | Payer: Medicaid Other | Source: Ambulatory Visit | Attending: Radiation Oncology | Admitting: Radiation Oncology

## 2017-12-08 DIAGNOSIS — Z510 Encounter for antineoplastic radiation therapy: Secondary | ICD-10-CM | POA: Diagnosis not present

## 2017-12-09 ENCOUNTER — Ambulatory Visit
Admission: RE | Admit: 2017-12-09 | Discharge: 2017-12-09 | Disposition: A | Discharge status: Home / Self Care | Payer: Medicaid Other | Source: Ambulatory Visit | Attending: Radiation Oncology | Admitting: Radiation Oncology

## 2017-12-09 DIAGNOSIS — Z510 Encounter for antineoplastic radiation therapy: Secondary | ICD-10-CM | POA: Diagnosis not present

## 2017-12-12 ENCOUNTER — Ambulatory Visit
Admission: RE | Admit: 2017-12-12 | Discharge: 2017-12-12 | Disposition: A | Discharge status: Home / Self Care | Payer: Medicaid Other | Source: Ambulatory Visit | Attending: Radiation Oncology | Admitting: Radiation Oncology

## 2017-12-12 DIAGNOSIS — Z510 Encounter for antineoplastic radiation therapy: Secondary | ICD-10-CM | POA: Diagnosis not present

## 2017-12-13 ENCOUNTER — Telehealth: Payer: Self-pay

## 2017-12-13 ENCOUNTER — Other Ambulatory Visit: Payer: Self-pay | Admitting: Radiation Oncology

## 2017-12-13 ENCOUNTER — Encounter: Payer: Self-pay | Admitting: Radiation Oncology

## 2017-12-13 ENCOUNTER — Ambulatory Visit
Admission: RE | Admit: 2017-12-13 | Discharge: 2017-12-13 | Disposition: A | Discharge status: Home / Self Care | Payer: Medicaid Other | Source: Ambulatory Visit | Attending: Radiation Oncology | Admitting: Radiation Oncology

## 2017-12-13 DIAGNOSIS — Z510 Encounter for antineoplastic radiation therapy: Secondary | ICD-10-CM | POA: Diagnosis not present

## 2017-12-13 DIAGNOSIS — C801 Malignant (primary) neoplasm, unspecified: Principal | ICD-10-CM

## 2017-12-13 DIAGNOSIS — C7989 Secondary malignant neoplasm of other specified sites: Secondary | ICD-10-CM

## 2017-12-13 MED ORDER — OXYCODONE HCL 10 MG PO TABS: 10.0000 mg | ORAL_TABLET | Freq: Four times a day (QID) | ORAL | 0 refills | Status: DC

## 2017-12-13 MED FILL — oxyCODONE HCL 10 MG TABS: 10 | 7 days supply | Qty: 28 | Fill #0

## 2017-12-13 NOTE — Telephone Encounter (Signed)
Pt's fiance called to see if pt's pain medication could be refilled. Informed Dr. Isidore Moos who stated she will discuss with pt when he comes around for his undertreat appt today.   Called pt back directly to update him on plan. Pt verbalized understanding and agreement, and knows to call back should he have any other questions/concerns.

## 2017-12-13 NOTE — Progress Notes (Signed)
Pt returned from [place] upset because pharmacy would not refill prescription how he states he and Dr. Sondra Come had "agreed on". Per pt "I take two tablets in the morning, two around 12-1pm, two again around the evening, and two around bedtime. Eight total, and Dr. Sondra Come knows that". Explained the pt that that's not how the prescription is written--it's written for 1 tablet every 6 hours. Asked if he was aware that Dr. Sondra Come had increased his dose from 5 mg to 10 mg and which is why he wrote it for 1 tablet. Pt states, "I know the prescription is for 10 mg, but I need 2 tablets every six hours." Melanie Rodgers-AD and Sam Presnell-RN came in the exam room with the patient and myself to assist in explaining to the pt that Dr. Isidore Moos simply refilled the prescription that Dr. Sondra Come had written, but that she would not alter the dose. Also reinforced that if he had further questions/concerns that he would have to wait to address them with Dr. Sondra Come when he returns on Monday 12/19/17. Despite numerous explanations pt still verbalized intent to take prescription how "I've been taking it and see Dr. Sondra Come on Monday".   About 30-45 minutes later received a phone call from the [facility] stating that they could not refill the prescription because it was not due for refill. They would refill it if we authorized it. I told the pharmacist that I could not give her authorization, but I could transfer her to Dr. Pearlie Oyster office phone since she refilled the prescription while Dr. Sondra Come was out of the office. Call transferred to Dr. Isidore Moos.

## 2017-12-13 NOTE — Progress Notes (Signed)
I saw patient for Dr. Sondra Come today, who is out of office this week.  Pt above has been prescribed oxycodone 10mg  tabs - take up to 4 tabs daily. He stated he is almost is out as of today - he ran out early; he is taking more tablets per daily than prescribed due to bone pain.  Leaving town tomorrow,he states.   I Rx'd 28 more tablets (1 wk supply) and told him to call Dr Sondra Come for refill if needed next week. He has a legitimate reason to have pain (bone mets) so I Rx'd a week's supply at current dosages. Explained this plan to him.    Patient later returned to clinic asking for Rx to be changed to TWO tablets per dose, but I kept Rx as written as this is what Dr Sondra Come had previously recommended.  [facility] pharmacy then called out of concern, since today's Rx was premature.  I told them it is ok to fill the Rx as written. I will defer to Dr. Sondra Come to refill or increase/decrease doses as appropriate when he returns.   -----------------------------------  Eppie Gibson, MD

## 2017-12-14 ENCOUNTER — Encounter: Payer: Self-pay | Admitting: Radiation Oncology

## 2017-12-14 ENCOUNTER — Ambulatory Visit
Admission: RE | Admit: 2017-12-14 | Discharge: 2017-12-14 | Disposition: A | Discharge status: Home / Self Care | Payer: Medicaid Other | Source: Ambulatory Visit | Attending: Radiation Oncology | Admitting: Radiation Oncology

## 2017-12-14 DIAGNOSIS — Z510 Encounter for antineoplastic radiation therapy: Secondary | ICD-10-CM | POA: Diagnosis not present

## 2017-12-19 ENCOUNTER — Other Ambulatory Visit: Payer: Self-pay

## 2017-12-19 ENCOUNTER — Ambulatory Visit
Admission: RE | Admit: 2017-12-19 | Discharge: 2017-12-19 | Disposition: A | Discharge status: Home / Self Care | Payer: Medicaid Other | Source: Ambulatory Visit | Attending: Radiation Oncology | Admitting: Radiation Oncology

## 2017-12-19 ENCOUNTER — Encounter: Payer: Self-pay | Admitting: Radiation Oncology

## 2017-12-19 VITALS — BP 156/96 | HR 73 | Temp 98.8°F | Resp 18 | Ht 69.0 in | Wt 173.2 lb

## 2017-12-19 DIAGNOSIS — Z79899 Other long term (current) drug therapy: Secondary | ICD-10-CM | POA: Diagnosis not present

## 2017-12-19 DIAGNOSIS — C7951 Secondary malignant neoplasm of bone: Secondary | ICD-10-CM | POA: Insufficient documentation

## 2017-12-19 DIAGNOSIS — C61 Malignant neoplasm of prostate: Secondary | ICD-10-CM | POA: Diagnosis not present

## 2017-12-19 DIAGNOSIS — C7989 Secondary malignant neoplasm of other specified sites: Secondary | ICD-10-CM | POA: Diagnosis present

## 2017-12-19 DIAGNOSIS — C801 Malignant (primary) neoplasm, unspecified: Secondary | ICD-10-CM | POA: Diagnosis present

## 2017-12-19 DIAGNOSIS — M4854XA Collapsed vertebra, not elsewhere classified, thoracic region, initial encounter for fracture: Secondary | ICD-10-CM | POA: Insufficient documentation

## 2017-12-19 MED ORDER — OXYCODONE HCL 10 MG PO TABS: 20.0000 mg | ORAL_TABLET | Freq: Four times a day (QID) | ORAL | 0 refills | Status: DC

## 2017-12-19 MED FILL — oxyCODONE HCL 10 MG TABS: 10 | 30 days supply | Qty: 240 | Fill #0

## 2017-12-19 NOTE — Progress Notes (Signed)
Pt is here for follow up with Dr. Sondra Come. Pt reports 5/10 pain in back. Pt reports he breaks out in cold sweats and tires easily. Pt is using cream to skin in radiation area. Pt reports itching in radiation area. Pt denies diarrhea/constipation. Pt denies nausea/vomiting. Pt is taking Zytiga.   BP (!) 156/96 (BP Location: Right Arm, Patient Position: Sitting, Cuff Size: Normal)   Pulse 73   Temp 98.8 F (37.1 C) (Oral)   Resp 18   Ht 5\' 9"  (1.753 m)   Wt 173 lb 3.2 oz (78.6 kg)   SpO2 100%   BMI 25.58 kg/m   Wt Readings from Last 3 Encounters:  12/19/17 173 lb 3.2 oz (78.6 kg)  11/28/17 173 lb (78.5 kg)  11/22/17 174 lb (78.9 kg)

## 2017-12-19 NOTE — Progress Notes (Signed)
  Radiation Oncology         [phone]) [phone] ________________________________  Name: Jonathan Campbell MRN: [id number]  Date: 12/14/2017  DOB: December 20, 1960  End of Treatment Note  Diagnosis:   57 y.o. male with Metastatic prostate cancer     Indication for treatment:  Palliative       Radiation treatment dates:   12/01/2017 - 12/14/2017  Site/dose:    1. Left Scapula / 30 Gy in 10 fractions 2. Lumbar Spine (L4 region) / 30 Gy in 10 fractions  Beams/energy:    1. Isodose Plan / 10X Photon 2. 3D / 15X Photon  Narrative: The patient tolerated radiation treatment relatively well.  He does have hyperpigmentation present to the skin on his lower back. Patient reports his mid-back pain is improved after undergoing his interventional radiology procedure. He reports improvement of pain in usual sites (back, right hip, and shoulder) but that he is having new pain along left lower back. He was provided with a refill of oxycodone. He reports harder bowel movements since starting Zytiga (prescribed by Dr. Alen Blew) and states he experiences hot and cold flashes while on the medication.   Plan: The patient has completed radiation treatment. The patient will return to radiation oncology clinic for routine followup in one month. I advised them to call or return sooner if they have any questions or concerns related to their recovery or treatment.  -----------------------------------  Blair Promise, PhD, MD  This document serves as a record of services personally performed by Gery Pray, MD. It was created on his behalf by Rae Lips, a trained medical scribe. The creation of this record is based on the scribe's personal observations and the provider's statements to them. This document has been checked and approved by the attending provider.

## 2017-12-19 NOTE — Progress Notes (Signed)
Radiation Oncology         [phone] ________________________________  Name: Jonathan Campbell MRN: [id number]  Date: 12/19/2017  DOB: 15-Nov-1960  Follow-Up Visit Note  CC: Patient, No Pcp Per  Domenic Polite, MD    ICD-10-CM   1. 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 male with Metastatic prostate cancer  Interval Since Last Radiation:  5 days   Radiation treatment dates:   12/01/2017 - 12/14/2017 Site/dose:   1. Left Scapula / 30 Gy in 10 fractions 2. Lumbar Spine (L4 region) / 30 Gy in 10 fractions  Radiation treatment dates: 09/16/2017 - 09/29/2017 Site/dose:  T12-Spine / 30 Gy in 10 fractions  Narrative:  The patient returns today for routine follow-up of radiation completed 5 days ago to his left scapula and lumbar spine. He reports 5/10 pain in his back. He states that he breaks out in cold sweats and fatigues easily. He is using cream to skin in treated area. He reports itching in treated area. He is taking Zytiga. He denies any nausea, vomiting, diarrhea, or constipation.                          ALLERGIES:  has No Known Allergies.  Meds: Current Outpatient Medications  Medication Sig Dispense Refill  . abiraterone acetate (ZYTIGA) 250 MG tablet Take 4 tablets (1,000 mg total) by mouth daily. Take on an empty stomach 1 hour before or 2 hours after a meal 120 tablet 0  . polyethylene glycol (MIRALAX / GLYCOLAX) packet Take 17 g by mouth daily. Use OTC Clearax 17 g po daily 30 each 2  . acetaminophen (TYLENOL) 325 MG tablet Take 2 tablets (650 mg total) by mouth every 6 (six) hours as needed for mild pain. (Patient not taking: Reported on 12/19/2017) 120 tablet 0  . bicalutamide (CASODEX) 50 MG tablet Take 1 tablet (50 mg total) by mouth daily. (Patient not taking: Reported on 12/19/2017) 30 tablet 0  . methocarbamol (ROBAXIN) 750 MG tablet Take 1 tablet (750 mg total) by mouth 3 (three) times  daily. (Patient not taking: Reported on 11/28/2017) 90 tablet 2  . Oxycodone HCl 10 MG TABS Take 2 tablets (20 mg total) by mouth 4 (four) times daily. 240 tablet 0  . ranitidine (ZANTAC) 300 MG tablet Take 1 tablet (300 mg total) by mouth at bedtime. (Patient not taking: Reported on 12/19/2017) 30 tablet 1   No current facility-administered medications for this encounter.     Physical Findings: The patient is in no acute distress. Patient is alert and oriented.  height is 5\' 9"  (1.753 m) and weight is 173 lb 3.2 oz (78.6 kg). His oral temperature is 98.8 F (37.1 C). His blood pressure is 156/96 (abnormal) and his pulse is 73. His respiration is 18 and oxygen saturation is 100%.   Lungs are clear to auscultation bilaterally. Heart has regular rate and rhythm. No palpable cervical, supraclavicular, or axillary adenopathy. Abdomen soft, non-tender, normal bowel sounds.  Patient has some mild tenderness with palpation along the left iliac crest. He also has complaints of pain in the sacrum region.   Lab Findings: Lab Results  Component Value Date   WBC 6.4 11/22/2017   HGB 15.9 11/22/2017   HCT 45.9 11/22/2017   MCV 82.6 11/22/2017   PLT 269 11/22/2017    Radiographic Findings: Ir Bone Tumor(s)rf  Ablation  Result Date: 11/22/2017 INDICATION: 57 year old male with metastatic prostate carcinoma and a pathologic T12 compression fracture. EXAM: RADIOFREQUENCY ABLATION OF T12 METASTATIC TUMOR WITH OSTEO COOL VERTEBRAL AUGMENTATION WITH KYPHOPLASTY VIA BIPEDICULAR APPROACH COMPARISON:  MR 11/17/2017, 09/13/2017 MEDICATIONS: As antibiotic prophylaxis, 2.0 g Ancef was ordered pre-procedure and administered intravenously within 1 hour of incision. ANESTHESIA/SEDATION: Moderate (conscious) sedation was employed during this procedure. A total of Versed 3.0 mg and Fentanyl 150 mcg was administered intravenously. Moderate Sedation Time: 54 minutes. The patient's level of consciousness and vital signs were  monitored continuously by radiology nursing throughout the procedure under my direct supervision. FLUOROSCOPY TIME:  Fluoroscopy Time: 8 minutes 48 seconds (086 mGy) COMPLICATIONS: None PROCEDURE: Following a full explanation of the procedure along with the potentially associated complications, a witnessed informed consent was obtained. Specific risks that were discussed included bleeding, infection, injury to adjacent structures, neurologic injury, embolization of cement within the veins, failure of the procedure to improve pain, need for further procedure/ surgery, cardiopulmonary collapse, death. The patient understands the risks and wishes to proceed. The patient was placed prone on the fluoroscopic table. Nasal oxygen was administered. Physiologic monitoring was performed throughout the duration of the procedure. The skin overlying the T12 region was prepped and draped in the usual sterile fashion. The T12 vertebral body was identified and the right pedicle was infiltrated with 1% lidocaine. This was then followed by the advancement of Medtronic Osteocool Trocar needle through the right pedicle into the anterior one-third. The left T12 pedicle was then infiltrated with 1% lidocaine. A small incision was made with 11 blade scalpel, and then a Medtronic Osteocool Trocar needle was advanced through the left pedicle into the anterior 1/3 of the vertebral body. The bone drill/measuring tool was then advanced through the bilateral pedicles to estimate length of the required Osteocool ablation device. 15 mm device selected for the right pedicle and 15 mm device selected for the left pedicle. Once position was confirmed, ablation was initiated for total of 11 minutes left, 11 minutes right. The RFA ablation devices were then removed. Balloon cannula were advanced bilaterally. Simultaneous inflation of the left and right pedicular cannula balloons was performed under fluoroscopic observation. Methylmethacrylate mixture  was then reconstituted. Under biplane intermittent fluoroscopy, the methylmethacrylate was then injected into the cavity at the posterior T12 vertebral body with filling of the space. Trace epidural venous contamination was identified. The needles were then removed. Hemostasis was achieved at the skin entry site. There were no acute complications. Patient tolerated the procedure well. The patient was observed for 30 minutes and returned to room in good condition. IMPRESSION: Status post radiofrequency ablation of T12 pathologic fracture/metastatic prostate carcinoma, with bipedicular kyphoplasty. Signed, Dulcy Fanny. Earleen Newport, DO [facility] Radiology Electronically Signed   By: Corrie Mckusick D.O.   On: 11/22/2017 16:57   Ir Kypho Thoracic With Bone Biopsy  Result Date: 11/22/2017 INDICATION: 57 year old male with metastatic prostate carcinoma and a pathologic T12 compression fracture. EXAM: RADIOFREQUENCY ABLATION OF T12 METASTATIC TUMOR WITH OSTEO COOL VERTEBRAL AUGMENTATION WITH KYPHOPLASTY VIA BIPEDICULAR APPROACH COMPARISON:  MR 11/17/2017, 09/13/2017 MEDICATIONS: As antibiotic prophylaxis, 2.0 g Ancef was ordered pre-procedure and administered intravenously within 1 hour of incision. ANESTHESIA/SEDATION: Moderate (conscious) sedation was employed during this procedure. A total of Versed 3.0 mg and Fentanyl 150 mcg was administered intravenously. Moderate Sedation Time: 54 minutes. The patient's level of consciousness and vital signs were monitored continuously by radiology nursing throughout the procedure under my  direct supervision. FLUOROSCOPY TIME:  Fluoroscopy Time: 8 minutes 48 seconds (867 mGy) COMPLICATIONS: None PROCEDURE: Following a full explanation of the procedure along with the potentially associated complications, a witnessed informed consent was obtained. Specific risks that were discussed included bleeding, infection, injury to adjacent structures,  neurologic injury, embolization of cement within the veins, failure of the procedure to improve pain, need for further procedure/ surgery, cardiopulmonary collapse, death. The patient understands the risks and wishes to proceed. The patient was placed prone on the fluoroscopic table. Nasal oxygen was administered. Physiologic monitoring was performed throughout the duration of the procedure. The skin overlying the T12 region was prepped and draped in the usual sterile fashion. The T12 vertebral body was identified and the right pedicle was infiltrated with 1% lidocaine. This was then followed by the advancement of Medtronic Osteocool Trocar needle through the right pedicle into the anterior one-third. The left T12 pedicle was then infiltrated with 1% lidocaine. A small incision was made with 11 blade scalpel, and then a Medtronic Osteocool Trocar needle was advanced through the left pedicle into the anterior 1/3 of the vertebral body. The bone drill/measuring tool was then advanced through the bilateral pedicles to estimate length of the required Osteocool ablation device. 15 mm device selected for the right pedicle and 15 mm device selected for the left pedicle. Once position was confirmed, ablation was initiated for total of 11 minutes left, 11 minutes right. The RFA ablation devices were then removed. Balloon cannula were advanced bilaterally. Simultaneous inflation of the left and right pedicular cannula balloons was performed under fluoroscopic observation. Methylmethacrylate mixture was then reconstituted. Under biplane intermittent fluoroscopy, the methylmethacrylate was then injected into the cavity at the posterior T12 vertebral body with filling of the space. Trace epidural venous contamination was identified. The needles were then removed. Hemostasis was achieved at the skin entry site. There were no acute complications. Patient tolerated the procedure well. The patient was observed for 30 minutes and  returned to room in good condition. IMPRESSION: Status post radiofrequency ablation of T12 pathologic fracture/metastatic prostate carcinoma, with bipedicular kyphoplasty. Signed, Dulcy Fanny. Earleen Newport, DO [facility] Radiology Electronically Signed   By: Corrie Mckusick D.O.   On: 11/22/2017 16:57    Impression:  Patient continues to have significant pain which is not controlled with 10 mg every 4 hours of oxycodone, but he does have good pain control taking 2 tablets every 6 hours. Therefore, I have written him a prescription for oxycodone 10 mg to take 2 tablets every 6 hours. Dispense 240 tablets.   Plan:  Return for routine follow-up 1 month from most recent radiation treatment.   ____________________________________  Blair Promise, PhD, MD  This document serves as a record of services personally performed by Gery Pray, MD. It was created on his behalf by Rae Lips, a trained medical scribe. The creation of this record is based on the scribe's personal observations and the provider's statements to them. This document has been checked and approved by the attending provider.

## 2017-12-20 ENCOUNTER — Other Ambulatory Visit: Payer: Self-pay | Admitting: *Deleted

## 2017-12-20 MED ORDER — ABIRATERONE ACETATE 250 MG PO TABS: 1000.0000 mg | ORAL_TABLET | Freq: Every day | ORAL | 0 refills | Status: DC

## 2018-01-12 ENCOUNTER — Ambulatory Visit
Admission: RE | Admit: 2018-01-12 | Discharge: 2018-01-12 | Disposition: A | Discharge status: Home / Self Care | Payer: Medicaid Other | Source: Ambulatory Visit | Attending: Radiation Oncology | Admitting: Radiation Oncology

## 2018-01-12 ENCOUNTER — Other Ambulatory Visit: Payer: Self-pay

## 2018-01-12 ENCOUNTER — Encounter: Payer: Self-pay | Admitting: Radiation Oncology

## 2018-01-12 VITALS — BP 148/87 | HR 66 | Temp 98.2°F | Resp 18 | Ht 69.0 in | Wt 174.4 lb

## 2018-01-12 DIAGNOSIS — C7951 Secondary malignant neoplasm of bone: Secondary | ICD-10-CM | POA: Insufficient documentation

## 2018-01-12 DIAGNOSIS — Z79899 Other long term (current) drug therapy: Secondary | ICD-10-CM | POA: Diagnosis not present

## 2018-01-12 DIAGNOSIS — C7989 Secondary malignant neoplasm of other specified sites: Secondary | ICD-10-CM | POA: Diagnosis present

## 2018-01-12 DIAGNOSIS — Z923 Personal history of irradiation: Secondary | ICD-10-CM | POA: Insufficient documentation

## 2018-01-12 DIAGNOSIS — M549 Dorsalgia, unspecified: Secondary | ICD-10-CM | POA: Diagnosis not present

## 2018-01-12 DIAGNOSIS — M25551 Pain in right hip: Secondary | ICD-10-CM | POA: Diagnosis not present

## 2018-01-12 DIAGNOSIS — C61 Malignant neoplasm of prostate: Secondary | ICD-10-CM | POA: Diagnosis present

## 2018-01-12 DIAGNOSIS — Z08 Encounter for follow-up examination after completed treatment for malignant neoplasm: Secondary | ICD-10-CM | POA: Insufficient documentation

## 2018-01-12 DIAGNOSIS — C801 Malignant (primary) neoplasm, unspecified: Secondary | ICD-10-CM

## 2018-01-12 MED ORDER — OXYCODONE HCL 10 MG PO TABS: 20.0000 mg | ORAL_TABLET | Freq: Four times a day (QID) | ORAL | 0 refills | Status: DC

## 2018-01-12 MED FILL — oxyCODONE HCL 10 MG TABS: 10 | 30 days supply | Qty: 240 | Fill #0

## 2018-01-12 NOTE — Progress Notes (Signed)
Pt here today for follow-up for metastatic prostate cancer to the lumbar spine and left scapula. Pt states that he has pain that is a 4 out of 10. Pt states that he still has left shoulder pain. Pt states that he has back pain without the back brace. Pt states that he feels better when he has the back brace on. Pt states that he is taking oxycodone for pain. Pt states mild fatigue. Pt states urgency and frequency. Pt denies having dysuria or hematuria. Pt states that he has soreness in his right groin area. Pt states that he has had the soreness for about 2 weeks. Pt states lower back pain when having bowel movements. Pt states that it eases up after he has a movements. Pt states that his bowel movements are soft and he goes 2 times per day. Pt states that skin is getting better and he is using the radiaplex 2 times per day.  BP (!) 148/87 (BP Location: Left Arm, Patient Position: Sitting, Cuff Size: Normal)   Pulse 66   Temp 98.2 F (36.8 C) (Oral)   Resp 18   Ht 5\' 9"  (1.753 m)   Wt 174 lb 6.4 oz (79.1 kg)   SpO2 100%   BMI 25.75 kg/m   Wt Readings from Last 3 Encounters:  01/12/18 174 lb 6.4 oz (79.1 kg)  12/19/17 173 lb 3.2 oz (78.6 kg)  11/28/17 173 lb (78.5 kg)

## 2018-01-12 NOTE — Progress Notes (Signed)
Radiation Oncology         [phone] ________________________________  Name: Jonathan Campbell MRN: [id number]  Date: 01/12/2018  DOB: August 16, 1960  Follow-Up Visit Note  CC: Patient, No Pcp Per  Domenic Polite, MD    ICD-10-CM   1. 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 Prostate cancer metastatic to bone [facility]) C61    C79.51     Diagnosis: Metastatic prostate cancer with osseous metastasis  Interval Since Last Radiation: 1 month  Radiation treatment dates: 12/01/2017 - 12/14/2017 Site/dose:  Left Scapula / 30 Gy in 10 fractions Lumbar Spine (L4 region) / 30 Gy in 10 fractions  Radiation treatment dates: 09/16/2017 - 09/29/2017 Site/dose:   T12-Spine / 30 Gy in 10 fractions  Narrative:  The patient returns today for routine follow-up. He reports continuing pain in his back, a 3/10, but mentions that it has improved. He also reports pain in his right hip and left buttock. He mentioned stumbling because of the pain. The pain worsens when he doesn't wear his brace, but mentions that the brace is causing discomfort because of how often he has to wear it. He notes that he received his Zytiga 2 weeks late, which contributed to the higher pain levels, citing issues with switching to Medicaid. He expressed frustration with getting in contact with his other doctors.  He'll be going to [place], [place] for his sister's wedding with two aunts and a brother for 13 days.                       ALLERGIES:  has No Known Allergies.  Meds: Current Outpatient Medications  Medication Sig Dispense Refill  . abiraterone acetate (ZYTIGA) 250 MG tablet Take 4 tablets (1,000 mg total) by mouth daily. Take on an empty stomach 1 hour before or 2 hours after a meal 120 tablet 0  . bicalutamide (CASODEX) 50 MG tablet Take 1 tablet (50 mg total) by mouth daily. 30 tablet 0  . methocarbamol (ROBAXIN) 750 MG tablet Take 1 tablet (750 mg total) by mouth 3  (three) times daily. 90 tablet 2  . Oxycodone HCl 10 MG TABS Take 2 tablets (20 mg total) by mouth 4 (four) times daily. 240 tablet 0  . polyethylene glycol (MIRALAX / GLYCOLAX) packet Take 17 g by mouth daily. Use OTC Clearax 17 g po daily 30 each 2  . ranitidine (ZANTAC) 300 MG tablet Take 1 tablet (300 mg total) by mouth at bedtime. 30 tablet 1  . acetaminophen (TYLENOL) 325 MG tablet Take 2 tablets (650 mg total) by mouth every 6 (six) hours as needed for mild pain. (Patient not taking: Reported on 01/12/2018) 120 tablet 0   No current facility-administered medications for this encounter.     Physical Findings: The patient is in no acute distress. Patient is alert and oriented.  height is 5\' 9"  (1.753 m) and weight is 174 lb 6.4 oz (79.1 kg). His oral temperature is 98.2 F (36.8 C). His blood pressure is 148/87 (abnormal) and his pulse is 66. His respiration is 18 and oxygen saturation is 100%.   Lungs are clear to auscultation bilaterally. Heart has regular rate and rhythm. No palpable cervical, supraclavicular, or axillary adenopathy. Abdomen soft, non-tender, normal bowel sounds. Neurological exam is nonfocal.  Lab Findings: Lab Results  Component Value Date   WBC 6.4 11/22/2017   HGB 15.9 11/22/2017   HCT 45.9 11/22/2017  MCV 82.6 11/22/2017   PLT 269 11/22/2017    Radiographic Findings: No results found.  Impression: Metastatic prostate cancer with osseous metastasis  Patient continues to have pain along his shoulder, back, and pelvis region. His pain seems to be somewhat better from the palliative radiation therapy.  Plan: Patient has a follow up appointment with Dr. Alen Blew tomorrow. He will follow up with me in 1 month. It is his impression that he will need surgery on his back; his surgeon mentioned to him initially after his radiation therapy, he would need to have surgery. I discussed with the patient that this would likely not occur unless he had progression of disease  in this area, especially since he has had external beam radiation therapy as well as with kyphoplasty in this area.  ____________________________________  Blair Promise, PhD, MD  This document serves as a record of services personally performed by Gery Pray, MD. It was created on his behalf by Wilburn Mylar, a trained medical scribe. The creation of this record is based on the scribe's personal observations and the provider's statements to them. This document has been checked and approved by the attending provider.

## 2018-01-13 ENCOUNTER — Ambulatory Visit: Payer: Self-pay | Admitting: Oncology

## 2018-01-13 ENCOUNTER — Other Ambulatory Visit: Payer: Self-pay

## 2018-01-19 ENCOUNTER — Inpatient Hospital Stay (HOSPITAL_BASED_OUTPATIENT_CLINIC_OR_DEPARTMENT_OTHER): Payer: Medicaid Other | Admitting: Oncology

## 2018-01-19 ENCOUNTER — Inpatient Hospital Stay: Payer: Medicaid Other | Attending: Oncology

## 2018-01-19 ENCOUNTER — Ambulatory Visit: Payer: Medicaid Other | Admitting: Radiation Oncology

## 2018-01-19 ENCOUNTER — Telehealth: Payer: Self-pay | Admitting: Oncology

## 2018-01-19 VITALS — BP 133/80 | HR 80 | Temp 98.5°F | Resp 18 | Ht 69.0 in | Wt 175.1 lb

## 2018-01-19 DIAGNOSIS — Z79818 Long term (current) use of other agents affecting estrogen receptors and estrogen levels: Secondary | ICD-10-CM | POA: Diagnosis not present

## 2018-01-19 DIAGNOSIS — C7951 Secondary malignant neoplasm of bone: Secondary | ICD-10-CM

## 2018-01-19 DIAGNOSIS — Z923 Personal history of irradiation: Secondary | ICD-10-CM

## 2018-01-19 DIAGNOSIS — Z79899 Other long term (current) drug therapy: Secondary | ICD-10-CM

## 2018-01-19 DIAGNOSIS — C61 Malignant neoplasm of prostate: Secondary | ICD-10-CM

## 2018-01-19 DIAGNOSIS — M549 Dorsalgia, unspecified: Secondary | ICD-10-CM | POA: Diagnosis not present

## 2018-01-19 DIAGNOSIS — G893 Neoplasm related pain (acute) (chronic): Secondary | ICD-10-CM | POA: Insufficient documentation

## 2018-01-19 LAB — CMP (CANCER CENTER ONLY)
ALT: 33 U/L (ref 0–44)
AST: 33 U/L (ref 15–41)
Albumin: 3.6 g/dL (ref 3.5–5.0)
Alkaline Phosphatase: 197 U/L — ABNORMAL HIGH (ref 38–126)
Anion gap: 6 (ref 5–15)
BUN: 11 mg/dL (ref 6–20)
CO2: 28 mmol/L (ref 22–32)
Calcium: 9.7 mg/dL (ref 8.9–10.3)
Chloride: 109 mmol/L (ref 98–111)
Creatinine: 1.11 mg/dL (ref 0.61–1.24)
GFR, Est AFR Am: >60 mL/min (ref >60)
GFR, Estimated: >60 mL/min (ref >60)
Glucose, Bld: 105 mg/dL — ABNORMAL HIGH (ref 70–99)
Potassium: 4.5 mmol/L (ref 3.5–5.1)
Sodium: 143 mmol/L (ref 135–145)
Total Bilirubin: 0.4 mg/dL (ref 0.3–1.2)
Total Protein: 7.9 g/dL (ref 6.5–8.1)

## 2018-01-19 LAB — CBC WITH DIFFERENTIAL (CANCER CENTER ONLY)
Basophils Absolute: 0 10*3/uL (ref 0.0–0.1)
Basophils Relative: 1 %
Eosinophils Absolute: 0.5 10*3/uL (ref 0.0–0.5)
Eosinophils Relative: 8 %
HCT: 42 % (ref 38.4–49.9)
Hemoglobin: 14.8 g/dL (ref 13.0–17.1)
Lymphocytes Relative: 14 %
Lymphs Abs: 0.8 10*3/uL — ABNORMAL LOW (ref 0.9–3.3)
MCH: 29.8 pg (ref 27.2–33.4)
MCHC: 35.2 g/dL (ref 32.0–36.0)
MCV: 84.5 fL (ref 79.3–98.0)
Monocytes Absolute: 0.5 10*3/uL (ref 0.1–0.9)
Monocytes Relative: 9 %
Neutro Abs: 3.9 10*3/uL (ref 1.5–6.5)
Neutrophils Relative %: 68 %
Platelet Count: 198 10*3/uL (ref 140–400)
RBC: 4.97 MIL/uL (ref 4.20–5.82)
RDW: 14 % (ref 11.0–14.6)
WBC Count: 5.6 10*3/uL (ref 4.0–10.3)

## 2018-01-19 NOTE — Progress Notes (Signed)
Hematology and Oncology Follow Up Visit  Jonathan Campbell [id number] Apr 19, 1961 57 y.o. 01/19/2018 12:38 PM Patient, No Pcp PerNo ref. 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     Prior Therapy:  Radiation therapy between September 16, 2017 and September 29, 2017 to complete 230 Gy in 10 fractions to thoracic spine.   He completed 4 weeks of Casodex between April and May 2019.  Current therapy:  Lupron 30 mg every 4 months started on 10/05/2017.  Zytiga 1000 mg daily started in May 2019.  Interim History: Jonathan Campbell is here for a follow-up visit.  Since last visit, he started Zytiga and has tolerated therapy reasonably well.  He denies any nausea, fatigue or lower extremity edema.  He remains active and attends to activities of daily living.  Continues to have issues with back pain related to his osseous metastasis.  He did complain RFA ablation of a T12 pathological fractures with bilateral kyphoplasty that was completed in May 2019.  He tolerated the procedure well with some improvement in his overall symptoms.  He is able to ambulate without any difficulties.  He has not reported any worsening neuropathy or neurological deficits.  He does not report any headaches, blurry vision, syncope or seizures.  He denies any changes in mental status or confusion.  Does not report any fevers, chills or sweats.  Does not report any cough, wheezing or hemoptysis.  Does not report any chest pain, palpitation, orthopnea or leg edema.  Does not report any nausea, vomiting or abdominal pain.  Does not report any hematochezia or melena.  Does not report any skeletal complaints.    Does not report frequency, urgency or hematuria.  Does not report any skin rashes or lesions.  Does not report any lymphadenopathy or petechiae.  Does not report any mood changes.   Remaining review of systems is negative.    Medications: I have reviewed the patient's current medications.  Current Outpatient Medications  Medication Sig Dispense Refill  . abiraterone acetate (ZYTIGA) 250 MG tablet Take 4 tablets (1,000 mg total) by mouth daily. Take on an empty stomach 1 hour before or 2 hours after a meal 120 tablet 0  . acetaminophen (TYLENOL) 325 MG tablet Take 2 tablets (650 mg total) by mouth every 6 (six) hours as needed for mild pain. (Patient not taking: Reported on 01/12/2018) 120 tablet 0  . bicalutamide (CASODEX) 50 MG tablet Take 1 tablet (50 mg total) by mouth daily. 30 tablet 0  . methocarbamol (ROBAXIN) 750 MG tablet Take 1 tablet (750 mg total) by mouth 3 (three) times daily. 90 tablet 2  . Oxycodone HCl 10 MG TABS Take 2 tablets (20 mg total) by mouth 4 (four) times daily. 240 tablet 0  . polyethylene glycol (MIRALAX / GLYCOLAX) packet Take 17 g by mouth daily. Use OTC Clearax 17 g po daily 30 each 2  . ranitidine (ZANTAC) 300 MG tablet Take 1 tablet (300 mg total) by mouth at bedtime. 30 tablet 1   No current facility-administered medications for this visit.      Allergies: No Known Allergies  Past Medical History, Surgical history, Social history, and Family History were reviewed and updated.    Physical Exam: Blood pressure 133/80, pulse 80, temperature 98.5 F (36.9 C), temperature source Oral, resp. rate 18, height 5\' 9"  (1.753 m), weight 175 lb  1.6 oz (79.4 kg), SpO2 99 %.   ECOG:  General appearance: Well-appearing gentleman without distress.. Head: Normocephalic without abnormalities.   Oropharynx: No thrush or ulcers. Eyes: Conjunctiva is clear without icterus. Lymph nodes: No lymphadenopathy noted in the cervical, supraclavicular, or axillary nodes  Heart:regular rate and rhythm, without any murmurs or gallops.  S1 and S2 without leg edema. Lung: Clear to auscultation without any rhonchi, wheezes or dullness to percussion. Abdomin:  soft, non-tender, nondistended with good bowel sounds. Neurological: No deficits noted on exam.  Intact motor or sensory and deep tendon reflexes. Skin: No ecchymosis or petechiae. Musculoskeletal: No no clubbing or cyanosis.     Lab Results: Lab Results  Component Value Date   WBC 5.6 01/19/2018   HGB 14.8 01/19/2018   HCT 42.0 01/19/2018   MCV 84.5 01/19/2018   PLT 198 01/19/2018     Chemistry      Component Value Date/Time   NA 143 01/19/2018 1121   K 4.5 01/19/2018 1121   CL 109 01/19/2018 1121   CO2 28 01/19/2018 1121   BUN 11 01/19/2018 1121   CREATININE 1.11 01/19/2018 1121      Component Value Date/Time   CALCIUM 9.7 01/19/2018 1121   ALKPHOS 197 (H) 01/19/2018 1121   AST 33 01/19/2018 1121   ALT 33 01/19/2018 1121   BILITOT 0.4 01/19/2018 1121        Impression and Plan:  57 year old man with  1.    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  He was diagnosed in March of 2019  with a PSA of 819.7.  He is currently receiving Zytiga and was started in May 2330 without complications.  Risks and benefits of continuing this treatment was discussed today and is agreeable to continue.  His PSA showed reasonable response to therapy and will be repeated today.  The natural course of this disease and alternative options were reviewed today.  He understands treatment is palliative at this time and operative options do exist but they are not curative.  2.  Spinal metastasis: He has T12 lesion completed radiation therapy in addition to RFA and osteochondral therapy completed on Nov 22, 2017.  3.  Bone pain: Manageable at this time with oxycodone.  4.  Bone directed therapy: We will await dental clearance before proceeding with Xgeva.  We will continue to recommend calcium and vitamin D for the time being.  5.  Androgen deprivation: He will continue on Lupron at 30 mg every 4 months.  Due for his injection  next week.  6.  Follow-up: We will be in 2 months   15  minutes was spent with the patient face-to-face today.  More than 50% of time was dedicated to patient counseling, education and discussing the natural course of his disease and future treatment options.       Zola Button, MD 7/25/201912:38 PM

## 2018-01-19 NOTE — Telephone Encounter (Signed)
Called pt re appts added per 7/25 sch msg - spoke w/ pt re appts.

## 2018-01-20 ENCOUNTER — Telehealth: Payer: Self-pay | Admitting: Oncology

## 2018-01-20 LAB — PROSTATE-SPECIFIC AG, SERUM (LABCORP): Prostate Specific Ag, Serum: 40.4 ng/mL — ABNORMAL HIGH (ref 0.0–4.0)

## 2018-01-20 NOTE — Telephone Encounter (Signed)
No orders per 7/25 los.

## 2018-01-23 ENCOUNTER — Other Ambulatory Visit: Payer: Self-pay | Admitting: Oncology

## 2018-01-23 ENCOUNTER — Inpatient Hospital Stay: Payer: Medicaid Other

## 2018-01-23 VITALS — BP 135/89 | HR 60 | Temp 99.0°F | Resp 18

## 2018-01-23 DIAGNOSIS — C61 Malignant neoplasm of prostate: Secondary | ICD-10-CM | POA: Diagnosis not present

## 2018-01-23 DIAGNOSIS — C7951 Secondary malignant neoplasm of bone: Principal | ICD-10-CM

## 2018-01-23 MED ORDER — LEUPROLIDE ACETATE (4 MONTH) 30 MG IM KIT
30.0000 mg | PACK | Freq: Once | INTRAMUSCULAR | Status: AC
  Administered 2018-01-23: 30 mg via INTRAMUSCULAR
  Filled 2018-01-23: qty 30

## 2018-01-23 NOTE — Patient Instructions (Signed)

## 2018-01-26 ENCOUNTER — Ambulatory Visit: Payer: Medicaid Other

## 2018-02-06 ENCOUNTER — Other Ambulatory Visit: Payer: Self-pay | Admitting: Radiation Oncology

## 2018-02-06 MED ORDER — OXYCODONE HCL 10 MG PO TABS: 20.0000 mg | ORAL_TABLET | Freq: Four times a day (QID) | ORAL | 0 refills | Status: DC

## 2018-02-06 MED FILL — oxyCODONE HCL 10 MG TABS: 10 | 7 days supply | Qty: 56 | Fill #0

## 2018-02-10 ENCOUNTER — Telehealth: Payer: Self-pay | Admitting: *Deleted

## 2018-02-10 NOTE — Telephone Encounter (Signed)
Called patient to inform of MRI for 02-13-18 - arrival time - 6:30 pm @ [facility], no restrictions to test, spoke with patient and he is aware of this test.

## 2018-02-13 ENCOUNTER — Ambulatory Visit (HOSPITAL_COMMUNITY): Admission: RE | Admit: 2018-02-13 | Payer: Medicaid Other | Source: Ambulatory Visit

## 2018-02-13 ENCOUNTER — Other Ambulatory Visit: Payer: Self-pay

## 2018-02-13 ENCOUNTER — Ambulatory Visit
Admission: RE | Admit: 2018-02-13 | Discharge: 2018-02-13 | Disposition: A | Discharge status: Home / Self Care | Payer: Medicaid Other | Source: Ambulatory Visit | Attending: Radiation Oncology | Admitting: Radiation Oncology

## 2018-02-13 ENCOUNTER — Encounter: Payer: Self-pay | Admitting: Radiation Oncology

## 2018-02-13 VITALS — BP 163/90 | HR 65 | Temp 98.5°F | Resp 20 | Ht 69.0 in | Wt 173.6 lb

## 2018-02-13 DIAGNOSIS — C61 Malignant neoplasm of prostate: Secondary | ICD-10-CM | POA: Insufficient documentation

## 2018-02-13 DIAGNOSIS — C7989 Secondary malignant neoplasm of other specified sites: Secondary | ICD-10-CM | POA: Diagnosis present

## 2018-02-13 DIAGNOSIS — C7951 Secondary malignant neoplasm of bone: Secondary | ICD-10-CM | POA: Insufficient documentation

## 2018-02-13 DIAGNOSIS — Z79899 Other long term (current) drug therapy: Secondary | ICD-10-CM | POA: Diagnosis not present

## 2018-02-13 DIAGNOSIS — C801 Malignant (primary) neoplasm, unspecified: Secondary | ICD-10-CM

## 2018-02-13 MED ORDER — OXYCODONE HCL 10 MG PO TABS: 20.0000 mg | ORAL_TABLET | Freq: Four times a day (QID) | ORAL | 0 refills | Status: DC

## 2018-02-13 MED FILL — oxyCODONE HCL 10 MG TABS: 10 | 30 days supply | Qty: 240 | Fill #0

## 2018-02-13 NOTE — Progress Notes (Signed)
Pt here today for a follow up appointment for prostate metastasis on the lumbar spine and left scapula. Pt states that he has pain that is a 6 out of 10.Pt states that the pain is in his back. Pt states that he is taking oxycodone for pain.  Pt denies having fatigue.   BP (!) 163/90   Pulse 65   Temp 98.5 F (36.9 C) (Oral)   Resp 20   Ht 5\' 9"  (1.753 m)   Wt 173 lb 9.6 oz (78.7 kg)   SpO2 100%   BMI 25.64 kg/m    BP (!) 163/90   Pulse 65   Temp 98.5 F (36.9 C) (Oral)   Resp 20   Ht 5\' 9"  (1.753 m)   Wt 173 lb 9.6 oz (78.7 kg)   SpO2 100%   BMI 25.64 kg/m

## 2018-02-13 NOTE — Progress Notes (Signed)
Pt here today for a follow-up appointm

## 2018-02-13 NOTE — Progress Notes (Signed)
Radiation Oncology         [phone] ________________________________  Name: Jonathan Campbell MRN: [id number]  Date: 02/13/2018  DOB: [birth year]  Follow-Up Visit Note  CC: Patient, No Pcp Per  Domenic Polite, MD    ICD-10-CM   1. 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 Prostate cancer metastatic to bone [facility]) C61    C79.51     Diagnosis: Metastatic prostate cancer with osseous metastasis  Interval Since Last Radiation: 2 months  Radiation treatment dates: 12/01/2017 - 12/14/2017 Site/dose:  Left Scapula / 30 Gy in 10 fractions L4-Spine / 30 Gy in 10 fractions  Radiation treatment dates: 09/16/2017 - 09/29/2017 Site/dose:   T12-Spine / 30 Gy in 10 fractions  Narrative:  The patient returns today for routine follow-up. He reports consistent sharp pains; pain to his left lower back, upper left leg, and right shoulder. He denies heartburn. He notes decreased appetite with oxycodone. He will have an MRI today. He notes he is working on getting a PCP with his recent enrollment in [place].  He recently returned from a trip to [place], [place]. He notes leaving his medicine in [place]. He reports getting his own housing and a bed this week, He states he has been sleeping on an air mattress at his brother's home and attributes this to some of his pain.            ALLERGIES:  has No Known Allergies.  Meds: Current Outpatient Medications  Medication Sig Dispense Refill  . bicalutamide (CASODEX) 50 MG tablet Take 1 tablet (50 mg total) by mouth daily. 30 tablet 0  . methocarbamol (ROBAXIN) 750 MG tablet Take 1 tablet (750 mg total) by mouth 3 (three) times daily. 90 tablet 2  . Oxycodone HCl 10 MG TABS Take 2 tablets (20 mg total) by mouth 4 (four) times daily. 240 tablet 0  . polyethylene glycol (MIRALAX / GLYCOLAX) packet Take 17 g by mouth daily. Use OTC Clearax 17 g po daily 30 each 2  . ZYTIGA 250 MG tablet TAKE 4 TABLETS BY MOUTH  ONCE DAILY AS DIRECTED.  TAKE 1 HOUR BEFORE OR 2 HOURS AFTER A MEAL 120 tablet 11  . acetaminophen (TYLENOL) 325 MG tablet Take 2 tablets (650 mg total) by mouth every 6 (six) hours as needed for mild pain. (Patient not taking: Reported on 02/13/2018) 120 tablet 0  . ranitidine (ZANTAC) 300 MG tablet Take 1 tablet (300 mg total) by mouth at bedtime. (Patient not taking: Reported on 02/13/2018) 30 tablet 1   No current facility-administered medications for this encounter.     Physical Findings: The patient is in no acute distress. Patient is alert and oriented.  height is 5\' 9"  (1.753 m) and weight is 173 lb 9.6 oz (78.7 kg). His oral temperature is 98.5 F (36.9 C). His blood pressure is 163/90 (abnormal) and his pulse is 65. His respiration is 20 and oxygen saturation is 100%.   Lungs are clear to auscultation bilaterally. Heart has regular rate and rhythm. No palpable cervical, supraclavicular, or axillary adenopathy. Abdomen soft, non-tender, normal bowel sounds. He still continues to wear his back brace. Lower motor strength is 5/5 in the proximal and distal muscle groups.  Lab Findings: Lab Results  Component Value Date   WBC 5.6 01/19/2018   HGB 14.8 01/19/2018   HCT 42.0 01/19/2018   MCV 84.5 01/19/2018   PLT 198 01/19/2018  Radiographic Findings: No results found.  Impression: Metastatic prostate cancer with osseous metastasis  Patient continues to have diffuse pain in several areas, requiring narcotic pain medication. Patient was given a refill today. He has an MRI tonight and will schedule a follow up for the results next week.   Plan: We talked about referral to a pain management specialist as he will most likely need continued narcotic pain medication. He will be seeing a PCP in the next few days.  Patient has a follow up appointment with Dr. Alen Blew on 03/23/18. He will follow up with me next week for MRI results.  ____________________________________  Blair Promise,  PhD, MD  This document serves as a record of services personally performed by Gery Pray, MD. It was created on his behalf by Wilburn Mylar, a trained medical scribe. The creation of this record is based on the scribe's personal observations and the provider's statements to them. This document has been checked and approved by the attending provider.

## 2018-02-14 ENCOUNTER — Ambulatory Visit (HOSPITAL_COMMUNITY): Payer: Medicaid Other

## 2018-02-14 ENCOUNTER — Ambulatory Visit (HOSPITAL_COMMUNITY)
Admission: RE | Admit: 2018-02-14 | Discharge: 2018-02-14 | Disposition: A | Discharge status: Home / Self Care | Payer: Medicaid Other | Source: Ambulatory Visit | Attending: Radiation Oncology | Admitting: Radiation Oncology

## 2018-02-14 ENCOUNTER — Other Ambulatory Visit: Payer: Self-pay | Admitting: Radiation Oncology

## 2018-02-14 DIAGNOSIS — Z923 Personal history of irradiation: Secondary | ICD-10-CM | POA: Diagnosis not present

## 2018-02-14 DIAGNOSIS — M4804 Spinal stenosis, thoracic region: Secondary | ICD-10-CM | POA: Diagnosis not present

## 2018-02-14 DIAGNOSIS — C61 Malignant neoplasm of prostate: Secondary | ICD-10-CM | POA: Diagnosis present

## 2018-02-14 DIAGNOSIS — C7951 Secondary malignant neoplasm of bone: Secondary | ICD-10-CM | POA: Diagnosis not present

## 2018-02-14 MED ORDER — GADOBENATE DIMEGLUMINE 529 MG/ML IV SOLN: 20.0000 mL | Freq: Once | INTRAVENOUS | Status: DC | PRN

## 2018-02-14 NOTE — Progress Notes (Signed)
Pt was able to tolerate MRI of the thoracic spine without contrast. Once it was time for contrast administration. He was unable to complete any more of his exam. Pt states his head was hurting too much and he didn't wish to complete the last 8 mins of his exam. Exam was completed without contrast.

## 2018-02-22 ENCOUNTER — Telehealth: Payer: Self-pay | Admitting: *Deleted

## 2018-02-22 NOTE — Telephone Encounter (Signed)
XXXXX

## 2018-02-22 NOTE — Telephone Encounter (Signed)
CALLED PATIENT TO ASK ABOUT COMING IN TOMORROW @ 4 PM FOR FU DUE TO DR. KINARD BEING WITH A NEW HDR [place], SPOKE WITH PATIENT AND HE AGREED TO COME IN @ 4 PM ON 02/23/18

## 2018-02-23 ENCOUNTER — Other Ambulatory Visit: Payer: Self-pay

## 2018-02-23 ENCOUNTER — Ambulatory Visit
Admission: RE | Admit: 2018-02-23 | Discharge: 2018-02-23 | Disposition: A | Discharge status: Home / Self Care | Payer: Medicaid Other | Source: Ambulatory Visit | Attending: Radiation Oncology | Admitting: Radiation Oncology

## 2018-02-23 VITALS — BP 157/95 | HR 56 | Temp 98.6°F | Resp 18 | Ht 69.0 in | Wt 173.4 lb

## 2018-02-23 DIAGNOSIS — C61 Malignant neoplasm of prostate: Secondary | ICD-10-CM

## 2018-02-23 DIAGNOSIS — Z79899 Other long term (current) drug therapy: Secondary | ICD-10-CM | POA: Insufficient documentation

## 2018-02-23 DIAGNOSIS — C7951 Secondary malignant neoplasm of bone: Secondary | ICD-10-CM | POA: Diagnosis not present

## 2018-02-23 DIAGNOSIS — C801 Malignant (primary) neoplasm, unspecified: Secondary | ICD-10-CM

## 2018-02-23 DIAGNOSIS — C7989 Secondary malignant neoplasm of other specified sites: Secondary | ICD-10-CM

## 2018-02-23 NOTE — Progress Notes (Signed)
Pt here for a follow-up today for metastasis of prostate cancer to L-spine and left scapula. Pt states that he has back pain that is a 5 out of 10. Pt states moderate fatigue and has been sleeping more. Pt denies having any nausea, vomiting or diarrhea. Pt states that he has had some shoulder pain. Pt states that he is taking oxycodone for pain.  BP (!) 157/95 (BP Location: Left Arm, Patient Position: Sitting)   Pulse (!) 56   Temp 98.6 F (37 C) (Oral)   Resp 18   Ht 5\' 9"  (1.753 m)   Wt 173 lb 6.4 oz (78.7 kg)   SpO2 100%   BMI 25.61 kg/m    Wt Readings from Last 3 Encounters:  02/23/18 173 lb 6.4 oz (78.7 kg)  02/13/18 173 lb 9.6 oz (78.7 kg)  01/19/18 175 lb 1.6 oz (79.4 kg)

## 2018-02-23 NOTE — Progress Notes (Signed)
Radiation Oncology         [phone] ________________________________  Name: Jonathan Campbell MRN: [id number]  Date: 02/23/2018  DOB: Jun 29, 1960  Follow-Up Visit Note  CC: Patient, No Pcp Per  Domenic Polite, MD    ICD-10-CM   1. 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 Overall, the scan revealed stable disease in the spine, improved since his palliative radiation therapy and vertebroplasty  The results of the thoracic spine MRI on 02/14/18 are as follows: 1. Interval radioablation and kyphoplasty of T12 metastasis. Fatty marrow replacement within the adjacent T11 through L1 vertebral bodies is compatible with posttreatment changes. 2. Stable T12 metastasis and compression deformity. Stable expansion of the vertebral body and pedicles from the metastasis. Small focus of extruded kyphoplasty material in the left lateral epidural space. 3. Moderate T12 level canal stenosis without cord compression at this time. 4. No new destructive lesion or pathologic fracture.  On review of systems, he reports back pain that is a 5/10, moderate fatigue, and some left shoulder and bilateral leg pains. He denies nausea, vomiting, and diarrhea. He notes increased pain when it rains.  ALLERGIES:  has No Known Allergies.  Meds: Current Outpatient Medications  Medication Sig Dispense Refill  . acetaminophen (TYLENOL) 325 MG tablet Take 2 tablets (650 mg total) by mouth every 6 (six) hours as needed for mild  pain. 120 tablet 0  . bicalutamide (CASODEX) 50 MG tablet Take 1 tablet (50 mg total) by mouth daily. 30 tablet 0  . methocarbamol (ROBAXIN) 750 MG tablet Take 1 tablet (750 mg total) by mouth 3 (three) times daily. 90 tablet 2  . Oxycodone HCl 10 MG TABS Take 2 tablets (20 mg total) by mouth 4 (four) times daily. 240 tablet 0  . polyethylene glycol (MIRALAX / GLYCOLAX) packet Take 17 g by mouth daily. Use OTC Clearax 17 g po daily 30 each 2  . ranitidine (ZANTAC) 300 MG tablet Take 1 tablet (300 mg total) by mouth at bedtime. 30 tablet 1  . ZYTIGA 250 MG tablet TAKE 4 TABLETS BY MOUTH ONCE DAILY AS DIRECTED.  TAKE 1 HOUR BEFORE OR 2 HOURS AFTER A MEAL 120 tablet 11   No current facility-administered medications for this encounter.     Physical Findings: The patient is in no acute distress. Patient is alert and oriented.  height is 5\' 9"  (1.753 m) and weight is 173 lb 6.4 oz (78.7 kg). His oral temperature is 98.6 F (37 C). His blood pressure is 157/95 (abnormal) and his pulse is 56 (abnormal). His respiration is 18 and oxygen saturation is 100%.   Lungs are clear to auscultation bilaterally. Heart has regular rate and rhythm. No palpable cervical, supraclavicular, or axillary adenopathy. Abdomen soft, non-tender, normal bowel sounds. He still continues to wear his back brace..Mild weakness to right lower extremity. Ambulating well.  Lab Findings: Lab Results  Component Value Date   WBC 5.6 01/19/2018   HGB 14.8 01/19/2018   HCT 42.0 01/19/2018  MCV 84.5 01/19/2018   PLT 198 01/19/2018    Radiographic Findings: Mr Thoracic Spine Wo Contrast  Result Date: 02/14/2018 CLINICAL DATA:  57 y/o M; history of metastatic prostate cancer with T12 metastatic tumor post radiofrequency ablation and kyphoplasty. EXAM: MRI THORACIC SPINE WITHOUT CONTRAST TECHNIQUE: Multiplanar, multisequence MR imaging of the thoracic spine was performed. No intravenous contrast was administered. COMPARISON:   11/17/2017 and 09/13/2017 MRI of the thoracic spine. Three hundred eighteen and 19 CT abdomen and pelvis. FINDINGS: Alignment:  Physiologic. Vertebrae: Increasing fatty marrow replacement within the T11 through L1 vertebral bodies and to lesser degree the L2 vertebral body compatible with posttreatment changes. Mild compression deformity of the T12 vertebral body is stable without new interval loss of vertebral body height. There is expansile disease within the bilateral pedicles and of the posterior aspect of the vertebral body which are impinging on the thecal sac similar when compared with the prior thoracic MRI. There has been interval kyphoplasty with a small amount of extruded kyphoplasty material in the left aspect of the epidural space (series 7, image 10). Multiple additional metastasis identified on the 03/19 MRI or better seen on that study, probably due to the increased sensitivity of contrast. No new destructive bony lesion or pathologic compression deformity of the thoracic spine is identified. Cord:  Normal signal and morphology. Paraspinal and other soft tissues: Numerous T2 hyperintense lesions throughout the liver as seen on prior thoracic MRI and CT of abdomen and pelvis. Disc levels: No significant disc displacement or foraminal stenosis. Expansile metastasis within the T12 vertebral body and pedicles as well as a small amount of extruded kyphoplasty material in the left aspect of the epidural space results in moderate canal stenosis without cord compression at this time. IMPRESSION: 1. Interval radioablation and kyphoplasty of T12 metastasis. Fatty marrow replacement within the adjacent T11 through L1 vertebral bodies is compatible with posttreatment changes. 2. Stable T12 metastasis and compression deformity. Stable expansion of the vertebral body and pedicles from the metastasis. Small focus of extruded kyphoplasty material in the left lateral epidural space. 3. Moderate T12 level canal  stenosis without cord compression at this time. 4. No new destructive lesion or pathologic fracture. Electronically Signed   By: Kristine Garbe M.D.   On: 02/14/2018 22:46    Impression: Metastatic prostate cancer with osseous metastasis, clinically stable  Patient continues to have diffuse pain in several areas, requiring narcotic pain medication. He is interested in a referral to the pain management clinic.  Plan: I will make a referral to the pain clinic. Patient has a follow up appointment with Dr. Alen Blew on 03/23/18. He will follow up with radiation oncology on Sept. 9th or 10th for refill of his pain medication, unless he is seen at the pain management clinic prior to this time.  ____________________________________  Blair Promise, PhD, MD  This document serves as a record of services personally performed by Gery Pray, MD. It was created on his behalf by Wilburn Mylar, a trained medical scribe. The creation of this record is based on the scribe's personal observations and the provider's statements to them. This document has been checked and approved by the attending provider.

## 2018-03-02 ENCOUNTER — Telehealth: Payer: Self-pay | Admitting: *Deleted

## 2018-03-02 NOTE — Telephone Encounter (Signed)
CALLED PATIENT TO INFORM OF FU WITH DR. KINARD ON 03-07-18 @ 11:45 AM , SPOKE WITH PATIENT AND HE IS AWARE OF THIS APPT.

## 2018-03-07 ENCOUNTER — Encounter: Payer: Self-pay | Admitting: Radiation Oncology

## 2018-03-07 ENCOUNTER — Ambulatory Visit
Admission: RE | Admit: 2018-03-07 | Discharge: 2018-03-07 | Disposition: A | Discharge status: Home / Self Care | Payer: Medicaid Other | Source: Ambulatory Visit | Attending: Radiation Oncology | Admitting: Radiation Oncology

## 2018-03-07 ENCOUNTER — Other Ambulatory Visit: Payer: Self-pay

## 2018-03-07 VITALS — BP 158/99 | HR 72 | Temp 98.5°F | Resp 18 | Ht 69.0 in | Wt 177.4 lb

## 2018-03-07 DIAGNOSIS — C7951 Secondary malignant neoplasm of bone: Principal | ICD-10-CM

## 2018-03-07 DIAGNOSIS — C61 Malignant neoplasm of prostate: Secondary | ICD-10-CM

## 2018-03-07 MED ORDER — OXYCODONE HCL 10 MG PO TABS: 20.0000 mg | ORAL_TABLET | Freq: Four times a day (QID) | ORAL | 0 refills | Status: DC

## 2018-03-07 MED FILL — oxyCODONE HCL 10 MG TABS: 10 | 30 days supply | Qty: 240 | Fill #0

## 2018-03-07 NOTE — Progress Notes (Signed)
Pt presents today for f/u with Dr. Sondra Come. Pt needs refill of pain medications. Pt states that Pain Management has not contacted him for an appt. Conveyed to pt that this RN will assure order is placed. Pt states repeatedly that he is having a bad day. Pt rates his back pain a 6/10.   BP (!) 158/99 (BP Location: Left Arm, Patient Position: Sitting)   Pulse 72   Temp 98.5 F (36.9 C) (Oral)   Resp 18   Ht 5\' 9"  (1.753 m)   Wt 177 lb 6 oz (80.5 kg)   SpO2 100%   BMI 26.19 kg/m   Wt Readings from Last 3 Encounters:  03/07/18 177 lb 6 oz (80.5 kg)  02/23/18 173 lb 6.4 oz (78.7 kg)  02/13/18 173 lb 9.6 oz (78.7 kg)   Loma Sousa, RN BSN

## 2018-03-09 ENCOUNTER — Encounter: Payer: Self-pay | Admitting: Radiation Oncology

## 2018-03-23 ENCOUNTER — Inpatient Hospital Stay: Payer: Medicaid Other | Attending: Oncology | Admitting: Oncology

## 2018-03-23 ENCOUNTER — Inpatient Hospital Stay: Payer: Medicaid Other

## 2018-03-23 VITALS — BP 151/93 | HR 61 | Temp 98.2°F | Resp 17 | Ht 69.0 in | Wt 176.2 lb

## 2018-03-23 DIAGNOSIS — Z923 Personal history of irradiation: Secondary | ICD-10-CM | POA: Diagnosis not present

## 2018-03-23 DIAGNOSIS — C779 Secondary and unspecified malignant neoplasm of lymph node, unspecified: Secondary | ICD-10-CM

## 2018-03-23 DIAGNOSIS — C7951 Secondary malignant neoplasm of bone: Principal | ICD-10-CM

## 2018-03-23 DIAGNOSIS — Z79899 Other long term (current) drug therapy: Secondary | ICD-10-CM

## 2018-03-23 DIAGNOSIS — C61 Malignant neoplasm of prostate: Secondary | ICD-10-CM | POA: Insufficient documentation

## 2018-03-23 DIAGNOSIS — G8929 Other chronic pain: Secondary | ICD-10-CM

## 2018-03-23 LAB — CBC WITH DIFFERENTIAL (CANCER CENTER ONLY)
Basophils Absolute: 0.1 10*3/uL (ref 0.0–0.1)
Basophils Relative: 1 %
Eosinophils Absolute: 0.3 10*3/uL (ref 0.0–0.5)
Eosinophils Relative: 5 %
HCT: 43.9 % (ref 38.4–49.9)
Hemoglobin: 14.9 g/dL (ref 13.0–17.1)
Lymphocytes Relative: 15 %
Lymphs Abs: 0.9 10*3/uL (ref 0.9–3.3)
MCH: 28.6 pg (ref 27.2–33.4)
MCHC: 33.8 g/dL (ref 32.0–36.0)
MCV: 84.7 fL (ref 79.3–98.0)
Monocytes Absolute: 0.5 10*3/uL (ref 0.1–0.9)
Monocytes Relative: 8 %
Neutro Abs: 4.2 10*3/uL (ref 1.5–6.5)
Neutrophils Relative %: 71 %
Platelet Count: 260 10*3/uL (ref 140–400)
RBC: 5.18 MIL/uL (ref 4.20–5.82)
RDW: 13.8 % (ref 11.0–14.6)
WBC Count: 6 10*3/uL (ref 4.0–10.3)

## 2018-03-23 LAB — CMP (CANCER CENTER ONLY)
ALT: 26 U/L (ref 0–44)
AST: 28 U/L (ref 15–41)
Albumin: 3.7 g/dL (ref 3.5–5.0)
Alkaline Phosphatase: 207 U/L — ABNORMAL HIGH (ref 38–126)
Anion gap: 7 (ref 5–15)
BUN: 16 mg/dL (ref 6–20)
CO2: 27 mmol/L (ref 22–32)
Calcium: 9.9 mg/dL (ref 8.9–10.3)
Chloride: 109 mmol/L (ref 98–111)
Creatinine: 1.19 mg/dL (ref 0.61–1.24)
GFR, Est AFR Am: >60 mL/min (ref >60)
GFR, Estimated: >60 mL/min (ref >60)
Glucose, Bld: 111 mg/dL — ABNORMAL HIGH (ref 70–99)
Potassium: 4.3 mmol/L (ref 3.5–5.1)
Sodium: 143 mmol/L (ref 135–145)
Total Bilirubin: 0.5 mg/dL (ref 0.3–1.2)
Total Protein: 8.3 g/dL — ABNORMAL HIGH (ref 6.5–8.1)

## 2018-03-23 NOTE — Progress Notes (Signed)
Hematology and Oncology Follow Up Visit  Jonathan Campbell [id number] June 17, 1961 57 y.o. 03/23/2018 11:26 AM Patient, No Pcp PerNo ref. provider found   Principle Diagnosis: 57 year old man with advanced prostate cancer with disease to the bone and lymphadenopathy diagnosed in March 2019.  He has castration-sensitive disease with PSA of over 800 at the time of diagnosis.  Prior Therapy:  Radiation therapy between September 16, 2017 and September 29, 2017 to complete 230 Gy in 10 fractions to thoracic spine.   He completed 4 weeks of Casodex between April and May 2019.  Current therapy:  Lupron 30 mg every 4 months started on 10/05/2017.  Zytiga 1000 mg daily started in May 2019.  Interim History: Jonathan Campbell presents today for a follow-up.  Since the last visit, he reports no major changes in his health.  Continues to have chronic back discomfort that is manageable overall.  He does take oxycodone periodically.  He denies any comp occasions related to [facility] which he takes regularly.  He denies any nausea or excessive fatigue.  He denies any recent falls or syncope.  His performance status and activity level is unchanged.   He does not report any headaches, blurry vision, syncope or seizures.  He denies any changes in his mentation or confusion.  Does not report any fevers, chills or sweats.  Does not report any cough, wheezing or hemoptysis.  Does not report any chest pain, palpitation, orthopnea or leg edema.  Does not report any nausea, vomiting or abdominal pain.  Does not report any changes in his bowel habits. Does not report any recent bone pain or discomfort.  Does not report frequency, urgency or hematuria.  Does not report any skin rashes or lesions.  Does not report any lymphadenopathy or petechiae.  Does not report any anxiety or depression.  Remaining review of systems is negative.    Medications: I have reviewed the patient's current medications.  Current Outpatient Medications  Medication  Sig Dispense Refill  . acetaminophen (TYLENOL) 325 MG tablet Take 2 tablets (650 mg total) by mouth every 6 (six) hours as needed for mild pain. 120 tablet 0  . bicalutamide (CASODEX) 50 MG tablet Take 1 tablet (50 mg total) by mouth daily. 30 tablet 0  . methocarbamol (ROBAXIN) 750 MG tablet Take 1 tablet (750 mg total) by mouth 3 (three) times daily. 90 tablet 2  . Oxycodone HCl 10 MG TABS Take 2 tablets (20 mg total) by mouth 4 (four) times daily. 240 tablet 0  . polyethylene glycol (MIRALAX / GLYCOLAX) packet Take 17 g by mouth daily. Use OTC Clearax 17 g po daily 30 each 2  . ranitidine (ZANTAC) 300 MG tablet Take 1 tablet (300 mg total) by mouth at bedtime. 30 tablet 1  . ZYTIGA 250 MG tablet TAKE 4 TABLETS BY MOUTH ONCE DAILY AS DIRECTED.  TAKE 1 HOUR BEFORE OR 2 HOURS AFTER A MEAL 120 tablet 11   No current facility-administered medications for this visit.      Allergies: No Known Allergies  Past Medical History, Surgical history, Social history, and Family History were reviewed and updated.    Physical Exam: Blood pressure (!) 151/93, pulse 61, temperature 98.2 F (36.8 C), temperature source Oral, resp. rate 17, height 5\' 9"  (1.753 m), weight 176 lb 3.2 oz (79.9 kg), SpO2 99 %.    ECOG: 1   General appearance: Comfortable appearing without any discomfort Head: Normocephalic without any trauma Oropharynx: Mucous membranes are moist and pink without any  thrush or ulcers. Eyes: Pupils are equal and round reactive to light. Lymph nodes: No cervical, supraclavicular, inguinal or axillary lymphadenopathy.   Heart:regular rate and rhythm.  S1 and S2 without leg edema. Lung: Clear without any rhonchi or wheezes.  No dullness to percussion. Abdomin: Soft, nontender, nondistended with good bowel sounds.  No hepatosplenomegaly. Musculoskeletal: No joint deformity or effusion.  Full range of motion noted. Neurological: No deficits noted on motor, sensory and deep tendon reflex  exam. Skin: No petechial rash or dryness.  Appeared moist.       Lab Results: Lab Results  Component Value Date   WBC 6.0 03/23/2018   HGB 14.9 03/23/2018   HCT 43.9 03/23/2018   MCV 84.7 03/23/2018   PLT 260 03/23/2018     Chemistry      Component Value Date/Time   NA 143 01/19/2018 1121   K 4.5 01/19/2018 1121   CL 109 01/19/2018 1121   CO2 28 01/19/2018 1121   BUN 11 01/19/2018 1121   CREATININE 1.11 01/19/2018 1121      Component Value Date/Time   CALCIUM 9.7 01/19/2018 1121   ALKPHOS 197 (H) 01/19/2018 1121   AST 33 01/19/2018 1121   ALT 33 01/19/2018 1121   BILITOT 0.4 01/19/2018 1121      Results for Jonathan Campbell (MRN [id number]) as of 03/23/2018 11:26  Ref. Range 09/22/2017 15:16 11/08/2017 12:34 01/19/2018 11:21  Prostate Specific Ag, Serum Latest Ref Range: 0.0 - 4.0 ng/mL 819.7 (H) 252.2 (H) 40.4 (H)    Impression and Plan:  57 year old man with  1.    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  He continues to tolerate Zytiga without any complications at this time.  He remains adherent to it with excellent benefit in his PSA which was discussed today.  His PSA is currently at 40.4 after initial presentation of over 800.  Risks and benefits of continuing this therapy was reviewed today and is agreeable to continue.  Systemic chemotherapy and be needed in the future if he develops progression of disease.  2.  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  Chronic pain still manageable at this time.  He does use oxycodone for that.   3.  Bone directed therapy: Continue to recommend calcium and vitamin D at this time.  Delton See will be used if his next bone scan showed worsening bony metastasis.  He will need dental clearance before doing so.  4.  Androgen deprivation: He will receive Lupron in December of this  year and every 4 months after that.  6.  Follow-up: We will be in December 2019.  15  minutes was spent with the patient face-to-face today.  More than 50% of time was dedicated to reviewing the natural course of his disease, treatment options and managing complications related to therapy.    Zola Button, MD 9/26/201911:26 AM

## 2018-03-24 ENCOUNTER — Telehealth: Payer: Self-pay

## 2018-03-24 ENCOUNTER — Telehealth: Payer: Self-pay | Admitting: *Deleted

## 2018-03-24 LAB — PROSTATE-SPECIFIC AG, SERUM (LABCORP): Prostate Specific Ag, Serum: 11.4 ng/mL — ABNORMAL HIGH (ref 0.0–4.0)

## 2018-03-24 NOTE — Telephone Encounter (Signed)
Spoke with patient concerning his upcoming appointment. Per 9/27 los. Also mailed a letter with a calender enclosed

## 2018-03-24 NOTE — Telephone Encounter (Signed)
-----   Message from Wyatt Portela, MD sent at 03/24/2018  8:14 AM EDT ----- Please let him know his PSA is down.

## 2018-03-24 NOTE — Telephone Encounter (Signed)
Lm on patient identified answering machine of last PSA results.

## 2018-03-31 ENCOUNTER — Telehealth: Payer: Self-pay | Admitting: *Deleted

## 2018-03-31 NOTE — Telephone Encounter (Signed)
CALLED PATIENT TO INFORM OF FU APPT. WITH DR. KINARD ON 04-03-18 @ 4 PM, PT. AGREED TO APPT.

## 2018-04-03 ENCOUNTER — Ambulatory Visit
Admission: RE | Admit: 2018-04-03 | Discharge: 2018-04-03 | Disposition: A | Discharge status: Home / Self Care | Payer: Medicaid Other | Source: Ambulatory Visit | Attending: Radiation Oncology | Admitting: Radiation Oncology

## 2018-04-03 ENCOUNTER — Other Ambulatory Visit: Payer: Self-pay

## 2018-04-03 ENCOUNTER — Encounter: Payer: Self-pay | Admitting: Radiation Oncology

## 2018-04-03 VITALS — BP 142/96 | HR 62 | Temp 97.9°F | Wt 179.2 lb

## 2018-04-03 DIAGNOSIS — C7951 Secondary malignant neoplasm of bone: Secondary | ICD-10-CM | POA: Diagnosis not present

## 2018-04-03 DIAGNOSIS — G8929 Other chronic pain: Secondary | ICD-10-CM | POA: Insufficient documentation

## 2018-04-03 DIAGNOSIS — C61 Malignant neoplasm of prostate: Secondary | ICD-10-CM

## 2018-04-03 MED ORDER — OXYCODONE HCL 10 MG PO TABS: 20.0000 mg | ORAL_TABLET | Freq: Four times a day (QID) | ORAL | 0 refills | Status: DC

## 2018-04-03 MED FILL — oxyCODONE HCL 10 MG TABS: 10 | 30 days supply | Qty: 240 | Fill #0

## 2018-04-03 NOTE — Progress Notes (Signed)
Pt here today for a follow up for metastatic prostate cancer to the left scapula and lumbar spine. Pt states today that his pain is a 6 out of 10 on the pain scale. Pt states that the pain is in his back and left shoulder. Pt states it is a throbbing pain. Pt states mild fatigue. Pt denies having diarrhea and states that he has regular bowel movements. Pt denies having any urinary bladder changes. Pt denies having any nausea or vomiting. Pt states that his left shoulder aches more at night. Pt states that he is able to move his left are but it does feel sore.  BP (!) 142/96 (BP Location: Right Arm)   Pulse 62   Temp 97.9 F (36.6 C) (Oral)   Wt 179 lb 3.2 oz (81.3 kg)   SpO2 100%   BMI 26.46 kg/m   Wt Readings from Last 3 Encounters:  04/03/18 179 lb 3.2 oz (81.3 kg)  03/23/18 176 lb 3.2 oz (79.9 kg)  03/07/18 177 lb 6 oz (80.5 kg)

## 2018-04-03 NOTE — Progress Notes (Signed)
Radiation Oncology         [phone] ________________________________  Name: Jonathan Campbell MRN: [id number]  Date: 04/03/2018  DOB: 1960-11-20  Follow-Up Visit Note  CC: Patient, No Pcp Per  Domenic Polite, MD    ICD-10-CM   1. Prostate cancer metastatic to bone [facility]) C61    C79.51     Diagnosis: Metastatic prostate cancer with osseous metastasis  Interval Since Last Radiation: 3 months and 2 weeks  12/01/17 - 12/14/17: Left Scapula and L4 Spine, each treated to 30 Gy in 10 fractions  09/16/17 - 09/29/17: T12-Spine, 30 Gy in 10 fractions  Narrative:  The patient returns today for routine follow-up. He states having issues getting in to see the pain management clinic.   He reports throbbing pain to his back and left shoulder that is a 6/10 and mild fatigue. He notes his left shoulder aches more at night and that he's able to move his left arm but it feels sore.  ALLERGIES:  has No Known Allergies.  Meds: Current Outpatient Medications  Medication Sig Dispense Refill  . acetaminophen (TYLENOL) 325 MG tablet Take 2 tablets (650 mg total) by mouth every 6 (six) hours as needed for mild pain. 120 tablet 0  . bicalutamide (CASODEX) 50 MG tablet Take 1 tablet (50 mg total) by mouth daily. 30 tablet 0  . methocarbamol (ROBAXIN) 750 MG tablet Take 1 tablet (750 mg total) by mouth 3 (three) times daily. 90 tablet 2  . Oxycodone HCl 10 MG TABS Take 2 tablets (20 mg total) by mouth 4 (four) times daily. 240 tablet 0  . polyethylene glycol (MIRALAX / GLYCOLAX) packet Take 17 g by mouth daily. Use OTC Clearax 17 g po daily 30 each 2  . ranitidine (ZANTAC) 300 MG tablet Take 1 tablet (300 mg total) by mouth at bedtime. 30 tablet 1  . ZYTIGA 250 MG tablet TAKE 4 TABLETS BY MOUTH ONCE DAILY AS DIRECTED.  TAKE 1 HOUR BEFORE OR 2 HOURS AFTER A MEAL 120 tablet 11   No current facility-administered medications for this encounter.     Physical Findings: The patient is in no acute distress. Patient  is alert and oriented.  weight is 179 lb 3.2 oz (81.3 kg). His oral temperature is 97.9 F (36.6 C). His blood pressure is 142/96 (abnormal) and his pulse is 62. His oxygen saturation is 100%. Lungs are clear to auscultation bilaterally. Heart has regular rate and rhythm. No palpable cervical, supraclavicular, or axillary adenopathy. Abdomen soft, non-tender, normal bowel sounds. Lower motor strength is stable.   Lab Findings: Lab Results  Component Value Date   WBC 6.0 03/23/2018   HGB 14.9 03/23/2018   HCT 43.9 03/23/2018   MCV 84.7 03/23/2018   PLT 260 03/23/2018    Radiographic Findings: No results found.  Impression: Metastatic prostate cancer with osseous metastasis, clinically stable  Patient continues to have chronic pain primarily in his right pelvis and left shoulder area at this time. Patient does appear to be responding to his Lupron and zytiga-- last PSA taken on 03/23/18 was 11.4, down from 40.4 taken 2 months ago. Patient has run out of his pain medication, and I have refilled this today. Patient reports he was unable to be seen by the pain management clinic due to outstanding debt. We will get the social worker involved to sort out this issue as he is on medicaid and has no money.   Plan: Patient will be scheduled for evaluation in the pain  clinic. He will continue on Lupron and Zytiga through Dr. Hazeline Junker office.   ____________________________________  Blair Promise, PhD, MD  This document serves as a record of services personally performed by Gery Pray, MD. It was created on his behalf by Wilburn Mylar, a trained medical scribe. The creation of this record is based on the scribe's personal observations and the provider's statements to them. This document has been checked and approved by the attending provider.

## 2018-05-01 ENCOUNTER — Ambulatory Visit
Admission: RE | Admit: 2018-05-01 | Discharge: 2018-05-01 | Disposition: A | Discharge status: Home / Self Care | Payer: Medicaid Other | Source: Ambulatory Visit | Attending: Radiation Oncology | Admitting: Radiation Oncology

## 2018-05-01 ENCOUNTER — Other Ambulatory Visit: Payer: Self-pay

## 2018-05-01 ENCOUNTER — Encounter: Payer: Self-pay | Admitting: Radiation Oncology

## 2018-05-01 VITALS — BP 138/74 | HR 79 | Temp 98.2°F | Resp 20 | Ht 68.0 in | Wt 183.8 lb

## 2018-05-01 DIAGNOSIS — C61 Malignant neoplasm of prostate: Secondary | ICD-10-CM | POA: Diagnosis present

## 2018-05-01 DIAGNOSIS — Z79899 Other long term (current) drug therapy: Secondary | ICD-10-CM | POA: Insufficient documentation

## 2018-05-01 DIAGNOSIS — C7951 Secondary malignant neoplasm of bone: Secondary | ICD-10-CM | POA: Insufficient documentation

## 2018-05-01 DIAGNOSIS — C801 Malignant (primary) neoplasm, unspecified: Secondary | ICD-10-CM

## 2018-05-01 DIAGNOSIS — C7989 Secondary malignant neoplasm of other specified sites: Secondary | ICD-10-CM

## 2018-05-01 MED ORDER — OXYCODONE HCL 10 MG PO TABS: 20.0000 mg | ORAL_TABLET | Freq: Four times a day (QID) | ORAL | 0 refills | Status: DC

## 2018-05-01 MED FILL — oxyCODONE HCL 10 MG TABS: 10 | 30 days supply | Qty: 240 | Fill #0

## 2018-05-01 NOTE — Progress Notes (Signed)
Radiation Oncology         [phone] ________________________________  Name: Jonathan Campbell MRN: [id number]  Date: 05/01/2018  DOB: [birth year]  Follow-Up Visit Note  CC: Patient, No Pcp Per  Jonathan Polite, MD    ICD-10-CM   1. Prostate cancer metastatic to bone ([facility]) C61    C79.51   2. 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 He states having issues getting in to see the pain management clinic.   His main concern today was his pain. He reports wearing his brace consistently every day at home. Marland Kitchen He has pain and aches to his back and shoulders that he attributes to the colder weather.   ALLERGIES:  has No Known Allergies.  Meds: Current Outpatient Medications  Medication Sig Dispense Refill  . acetaminophen (TYLENOL) 325 MG tablet Take 2 tablets (650 mg total) by mouth every 6 (six) hours as needed for mild pain. 120 tablet 0  . bicalutamide (CASODEX) 50 MG tablet Take 1 tablet (50 mg total) by mouth daily. 30 tablet 0  . methocarbamol (ROBAXIN) 750 MG tablet Take 1 tablet (750 mg total) by mouth 3 (three) times daily. 90 tablet 2  . Oxycodone HCl 10 MG TABS Take 2 tablets (20 mg total) by mouth 4 (four) times daily. 240 tablet 0  . polyethylene glycol (MIRALAX / GLYCOLAX) packet Take 17 g by mouth daily. Use OTC Clearax 17 g po daily 30 each 2  . ranitidine (ZANTAC) 300 MG tablet Take 1 tablet (300 mg total) by mouth at bedtime. 30 tablet 1  . ZYTIGA 250 MG tablet TAKE 4 TABLETS BY MOUTH ONCE DAILY AS DIRECTED.  TAKE 1 HOUR BEFORE OR 2 HOURS AFTER A MEAL 120 tablet 11   No current facility-administered medications for  this encounter.     Physical Findings: The patient is in no acute distress. Patient is alert and oriented.  height is 5\' 8"  (1.727 m) and weight is 183 lb 12.8 oz (83.4 kg). His oral temperature is 98.2 F (36.8 C). His blood pressure is 138/74 and his pulse is 79. His respiration is 20 and oxygen saturation is 98%. Lungs are clear to auscultation bilaterally. Heart has regular rate and rhythm. No palpable cervical, supraclavicular, or axillary adenopathy. Abdomen soft, non-tender, normal bowel sounds. Lower motor strength is stable.   Lab Findings: Lab Results  Component Value Date   WBC 6.0 03/23/2018   HGB 14.9 03/23/2018   HCT 43.9 03/23/2018   MCV 84.7 03/23/2018   PLT 260 03/23/2018    Radiographic Findings: No results found.  Impression: Metastatic prostate cancer with osseous metastasis, clinically stable  Patient continues to have chronic pain primarily in his right pelvis and left shoulder area at this time. Patient does appear to be responding to his Lupron and zytiga-- last PSA taken on 03/23/18 was 11.4, down from 40.4 taken 2 months ago. Patient has run out of his pain medication, and I have refilled this today. Patient reports he was unable to be seen by the pain management clinic due to outstanding debt. We will get the social worker involved to sort out  this issue as he is on medicaid and has no money.  Plan: Routine follow-up in 1 month for medication refill, unless he is seen in the pain clinic in interim.   ____________________________________  Blair Promise, PhD, MD  This document serves as a record of services personally performed by Gery Pray, MD. It was created on his behalf by Mary-Margaret Loma Messing, a trained medical scribe. The creation of this record is based on the scribe's personal observations and the provider's statements to them. This document has been checked and approved by the attending provider.

## 2018-05-01 NOTE — Progress Notes (Signed)
Pt here today for a follow-up for metastasis to left scapula and lumbar spine. Pt states that he has back pain that is a 7 out of 10. Pt states left shoulder pain that is a dull ache. Pt states that the pain is 7 out of 10. Pt states both legs ache and the right leg feels weak. Pt states mild fatigue. Pt denies having diarrhea.  Pt states that he tripped once in the last 3 months when he was getting out of bed.   BP 138/74   Pulse 79   Temp 98.2 F (36.8 C) (Oral)   Resp 20   Ht 5\' 8"  (1.727 m)   Wt 183 lb 12.8 oz (83.4 kg)   SpO2 98%   BMI 27.95 kg/m    Wt Readings from Last 3 Encounters:  05/01/18 183 lb 12.8 oz (83.4 kg)  04/03/18 179 lb 3.2 oz (81.3 kg)  03/23/18 176 lb 3.2 oz (79.9 kg)

## 2018-05-04 ENCOUNTER — Encounter: Payer: Self-pay | Admitting: *Deleted

## 2018-05-04 NOTE — Progress Notes (Signed)
[place] Work  Clinical Social Work was referred by Pension scheme manager for assessment of psychosocial needs.  Clinical Social Worker contacted patient by phone  to offer support and assess for needs.  Per oncologist note, patient needs set up with pain clinic but unable to be seen due to outstanding debt.  CSW is not familiar with pain clinic process and does not assist with Medicaid/billing questions.  CSW did explore patients feelings related to cancer experience.  Mr. Howerter shared he's "having a bad day" and tends to have many bad days where he has difficulty coping with his emotions.  CSW validated patients feelings and discussed normal emotions related to cancer.  Patient was interested in counseling for support- scheduled with CSW for 05/11/18 at 10:30.    Patient currently rides bus to get to appointments or attempts to use Medicaid transportation.  CSW will refer patient to transportation coordinator to assist with upcoming [place] appointment.    Gwinda Maine, LCSW  Clinical Social Worker [facility]

## 2018-05-29 ENCOUNTER — Encounter: Payer: Self-pay | Admitting: Radiation Oncology

## 2018-05-29 ENCOUNTER — Encounter: Payer: Self-pay | Admitting: *Deleted

## 2018-05-29 ENCOUNTER — Ambulatory Visit
Admission: RE | Admit: 2018-05-29 | Discharge: 2018-05-29 | Disposition: A | Discharge status: Home / Self Care | Payer: Medicaid Other | Source: Ambulatory Visit | Attending: Radiation Oncology | Admitting: Radiation Oncology

## 2018-05-29 ENCOUNTER — Other Ambulatory Visit: Payer: Self-pay

## 2018-05-29 VITALS — BP 172/95 | HR 74 | Resp 18 | Wt 182.2 lb

## 2018-05-29 DIAGNOSIS — C61 Malignant neoplasm of prostate: Secondary | ICD-10-CM | POA: Diagnosis not present

## 2018-05-29 DIAGNOSIS — Z923 Personal history of irradiation: Secondary | ICD-10-CM | POA: Insufficient documentation

## 2018-05-29 DIAGNOSIS — C801 Malignant (primary) neoplasm, unspecified: Secondary | ICD-10-CM

## 2018-05-29 DIAGNOSIS — G8929 Other chronic pain: Secondary | ICD-10-CM | POA: Insufficient documentation

## 2018-05-29 DIAGNOSIS — C7951 Secondary malignant neoplasm of bone: Secondary | ICD-10-CM | POA: Diagnosis not present

## 2018-05-29 DIAGNOSIS — C7989 Secondary malignant neoplasm of other specified sites: Secondary | ICD-10-CM | POA: Diagnosis present

## 2018-05-29 MED ORDER — OXYCODONE HCL 10 MG PO TABS: 20.0000 mg | ORAL_TABLET | Freq: Four times a day (QID) | ORAL | 0 refills | Status: DC

## 2018-05-29 MED FILL — oxyCODONE HCL 10 MG TABS: 10 | 30 days supply | Qty: 240 | Fill #0

## 2018-05-29 NOTE — Progress Notes (Signed)
[place] Work  Clinical Social Work met with patient in [place] office for counseling session.  CSW explored feelings related to living with cancer.  Mr. Seubert shared difficulty coping with pain, being unable to work and be as active as he once was, and feeling isolated. CSW encouraged patient to utilize [employer] and other programs.  Patient connected with [facility] [employer] artist, Lucrezia Starch, and plans to attend upcoming painting class.  Patient agreed to follow up with CSW at a later time.        Gwinda Maine, LCSW  Clinical Social Worker [facility]

## 2018-05-29 NOTE — Progress Notes (Signed)
Jonathan Campbell is here today for a follow-up appointment .Patient states that he is having pain in his rt shoulder,lower left side of his back,and across his shoulder blade,and both legs.Patient denies any fatigue. Patient states that he sweat a lot. Patient denies any issues with his bowels. Patient states that he has some nausea. Patient denies any issues with his skin .Patient states that his blood pressure is elevated due to the foods he has been eating . Patient denies any signs or symptoms of hypertension Vitals:   05/29/18 1618  BP: (!) 172/95  Pulse: 74  Resp: 18  TempSrc: Oral  SpO2: 100%  Weight: 182 lb 3.2 oz (82.6 kg)   Wt Readings from Last 3 Encounters:  05/29/18 182 lb 3.2 oz (82.6 kg)  05/01/18 183 lb 12.8 oz (83.4 kg)  04/03/18 179 lb 3.2 oz (81.3 kg)   .

## 2018-05-29 NOTE — Progress Notes (Signed)
Radiation Oncology         [phone] ________________________________  Name: Jonathan Campbell MRN: [id number]  Date: 05/29/2018  DOB: 08-09-1960  Follow-Up Visit Note  CC: Patient, No Pcp Per  Domenic Polite, MD    ICD-10-CM   1. 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  He is unaccompanied.  He continues to have chronic pain          On review of systems, he reports severe right shoulder and right neck pain for the past 8 days. He has been putting hot compresses on the area. Some type of imaging needs to be ordered to evaluate these issues. He reports having right lower extremity weakness. His pain gets worse as the temperature outside decreases. He will receive a refill for oxycodone today. he denies left side pain and any other symptoms. Pertinent positives are listed and detailed within the above HPI.                 ALLERGIES:  has No Known Allergies.  Meds: Current Outpatient Medications  Medication Sig Dispense Refill  . acetaminophen (TYLENOL) 325 MG tablet Take 2 tablets (650 mg total) by mouth every 6 (six) hours as needed for mild pain. 120 tablet 0  . Oxycodone HCl 10 MG TABS Take 2 tablets (20 mg total) by mouth 4 (four) times daily. 240 tablet 0  . polyethylene glycol (MIRALAX / GLYCOLAX) packet Take 17 g by mouth daily. Use OTC Clearax 17 g po daily 30 each 2  . ZYTIGA 250 MG tablet TAKE 4 TABLETS BY MOUTH ONCE DAILY AS DIRECTED.  TAKE 1 HOUR BEFORE OR 2 HOURS AFTER A MEAL 120 tablet 11  . bicalutamide (CASODEX) 50 MG tablet Take 1 tablet (50 mg total) by mouth daily. (Patient not taking: Reported on  05/29/2018) 30 tablet 0  . methocarbamol (ROBAXIN) 750 MG tablet Take 1 tablet (750 mg total) by mouth 3 (three) times daily. (Patient not taking: Reported on 05/29/2018) 90 tablet 2  . ranitidine (ZANTAC) 300 MG tablet Take 1 tablet (300 mg total) by mouth at bedtime. (Patient not taking: Reported on 05/29/2018) 30 tablet 1   No current facility-administered medications for this encounter.     Physical Findings: The patient is in no acute distress. Patient is alert and oriented.  weight is 182 lb 3.2 oz (82.6 kg). His blood pressure is 172/95 (abnormal) and his pulse is 74. His respiration is 18 and oxygen saturation is 100%. .  Lungs are clear to auscultation bilaterally. Heart has regular rate and rhythm. No palpable cervical, supraclavicular, or axillary adenopathy. Abdomen soft, non-tender, normal bowel sounds.patient experiences pain with palpation along the right shoulder and right lateral neck region  Lab Findings: Lab Results  Component Value Date   WBC 6.0 03/23/2018   HGB 14.9 03/23/2018   HCT 43.9 03/23/2018   MCV 84.7 03/23/2018   PLT 260 03/23/2018    Radiographic Findings: No results found.  Impression:  Metastatic prostate cancer. The patient continues to need chronic narcotic pain medication. He is reporting new pain in the right shoulder and  right neck area  Plan:  Patient will be scheduled for imaging to evaluate his new areas of pain.  ____________________________________   Blair Promise, PhD, MD    This document serves as a record of services personally performed by Gery Pray, MD. It was created on his behalf by Mary-Margaret Loma Messing, a trained medical scribe. The creation of this record is based on the scribe's personal observations and the provider's statements to them. This document has been checked and approved by the attending provider.

## 2018-05-31 ENCOUNTER — Ambulatory Visit: Payer: Self-pay | Admitting: Radiation Oncology

## 2018-06-01 ENCOUNTER — Inpatient Hospital Stay: Payer: Medicaid Other | Admitting: Oncology

## 2018-06-01 ENCOUNTER — Inpatient Hospital Stay: Payer: Medicaid Other

## 2018-06-01 ENCOUNTER — Telehealth: Payer: Self-pay | Admitting: Oncology

## 2018-06-01 NOTE — Telephone Encounter (Signed)
Scheduled appt per 12/5 sch message - pt is aware of appt date and time   

## 2018-06-06 ENCOUNTER — Telehealth: Payer: Self-pay

## 2018-06-06 ENCOUNTER — Inpatient Hospital Stay (HOSPITAL_BASED_OUTPATIENT_CLINIC_OR_DEPARTMENT_OTHER): Payer: Medicaid Other | Admitting: Oncology

## 2018-06-06 ENCOUNTER — Inpatient Hospital Stay: Payer: Medicaid Other | Attending: Oncology

## 2018-06-06 ENCOUNTER — Inpatient Hospital Stay: Payer: Medicaid Other

## 2018-06-06 VITALS — BP 152/91 | HR 62 | Temp 98.4°F | Resp 18

## 2018-06-06 DIAGNOSIS — Z79899 Other long term (current) drug therapy: Secondary | ICD-10-CM

## 2018-06-06 DIAGNOSIS — M549 Dorsalgia, unspecified: Secondary | ICD-10-CM

## 2018-06-06 DIAGNOSIS — C7951 Secondary malignant neoplasm of bone: Secondary | ICD-10-CM | POA: Insufficient documentation

## 2018-06-06 DIAGNOSIS — Z79818 Long term (current) use of other agents affecting estrogen receptors and estrogen levels: Secondary | ICD-10-CM | POA: Diagnosis not present

## 2018-06-06 DIAGNOSIS — C61 Malignant neoplasm of prostate: Secondary | ICD-10-CM | POA: Diagnosis present

## 2018-06-06 DIAGNOSIS — Z923 Personal history of irradiation: Secondary | ICD-10-CM | POA: Diagnosis not present

## 2018-06-06 LAB — CBC WITH DIFFERENTIAL (CANCER CENTER ONLY)
Abs Immature Granulocytes: 0.12 10*3/uL — ABNORMAL HIGH (ref 0.00–0.07)
Basophils Absolute: 0 10*3/uL (ref 0.0–0.1)
Basophils Relative: 1 %
Eosinophils Absolute: 0.4 10*3/uL (ref 0.0–0.5)
Eosinophils Relative: 6 %
HCT: 43.2 % (ref 39.0–52.0)
Hemoglobin: 14.9 g/dL (ref 13.0–17.0)
Immature Granulocytes: 2 %
Lymphocytes Relative: 17 %
Lymphs Abs: 1.1 10*3/uL (ref 0.7–4.0)
MCH: 28.6 pg (ref 26.0–34.0)
MCHC: 34.5 g/dL (ref 30.0–36.0)
MCV: 82.9 fL (ref 80.0–100.0)
Monocytes Absolute: 0.4 10*3/uL (ref 0.1–1.0)
Monocytes Relative: 7 %
Neutro Abs: 4.3 10*3/uL (ref 1.7–7.7)
Neutrophils Relative %: 67 %
Platelet Count: 272 10*3/uL (ref 150–400)
RBC: 5.21 MIL/uL (ref 4.22–5.81)
RDW: 13.9 % (ref 11.5–15.5)
WBC Count: 6.4 10*3/uL (ref 4.0–10.5)
nRBC: 0 % (ref 0.0–0.2)

## 2018-06-06 LAB — CMP (CANCER CENTER ONLY)
ALT: 24 U/L (ref 0–44)
AST: 25 U/L (ref 15–41)
Albumin: 3.5 g/dL (ref 3.5–5.0)
Alkaline Phosphatase: 233 U/L — ABNORMAL HIGH (ref 38–126)
Anion gap: 9 (ref 5–15)
BUN: 13 mg/dL (ref 6–20)
CO2: 27 mmol/L (ref 22–32)
Calcium: 9.7 mg/dL (ref 8.9–10.3)
Chloride: 107 mmol/L (ref 98–111)
Creatinine: 1.4 mg/dL — ABNORMAL HIGH (ref 0.61–1.24)
GFR, Est AFR Am: >60 mL/min (ref >60)
GFR, Estimated: 55 mL/min — ABNORMAL LOW (ref >60)
Glucose, Bld: 79 mg/dL (ref 70–99)
Potassium: 3.8 mmol/L (ref 3.5–5.1)
Sodium: 143 mmol/L (ref 135–145)
Total Bilirubin: 0.4 mg/dL (ref 0.3–1.2)
Total Protein: 8.1 g/dL (ref 6.5–8.1)

## 2018-06-06 MED ORDER — LEUPROLIDE ACETATE (4 MONTH) 30 MG IM KIT
30.0000 mg | PACK | Freq: Once | INTRAMUSCULAR | Status: AC
  Administered 2018-06-06: 30 mg via INTRAMUSCULAR
  Filled 2018-06-06: qty 30

## 2018-06-06 NOTE — Telephone Encounter (Signed)
Printed avs and calender eof upcoming appointment. Per 12/10 los

## 2018-06-06 NOTE — Progress Notes (Signed)
Hematology and Oncology Follow Up Visit  Jonathan Campbell [id number] [birth year] 57 y.o. 06/06/2018 11:27 AM Patient, No Pcp PerNo ref. 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  He presented with a PSA over 800.   Prior Therapy:  Radiation therapy between September 16, 2017 and September 29, 2017 to complete 230 Gy in 10 fractions to thoracic spine.   He completed 4 weeks of Casodex between April and May 2019.  Current therapy:  Lupron 30 mg every 4 months started on 10/05/2017.  Zytiga 1000 mg daily started in May 2019.  Interim History: Jonathan Campbell returns today for a repeat evaluation.  Since the last visit, he reports no major changes in his health.  Continues to have chronic back issues related to his surgery and radiation.  Denies any recent changes at this time and continues to take oxycodone for pain.  He is ambulatory although he does report a aching has improved with cold weather.  His performance status and quality of life remains unchanged.  He denies any complications related to Zytiga.   He does not report any headaches, blurry vision, syncope or seizures.  He denies any confusion or lethargy.  Does not report any fevers, chills or sweats.  Does not report any cough, wheezing or hemoptysis.  Does not report any chest pain, palpitation, orthopnea or leg edema.  Does not report any nausea, vomiting or abdominal pain.  Does not report any constipation or diarrhea. Does not report frequency, urgency or hematuria.  Does not report any ecchymosis or bruising.  Does not report any skin rashes or lesions.  Does not report any changes in his mood.  Remaining review of systems is negative.    Medications: I have reviewed the patient's current medications.  Current Outpatient Medications  Medication Sig Dispense Refill  . acetaminophen (TYLENOL) 325 MG tablet Take 2  tablets (650 mg total) by mouth every 6 (six) hours as needed for mild pain. 120 tablet 0  . bicalutamide (CASODEX) 50 MG tablet Take 1 tablet (50 mg total) by mouth daily. (Patient not taking: Reported on 05/29/2018) 30 tablet 0  . methocarbamol (ROBAXIN) 750 MG tablet Take 1 tablet (750 mg total) by mouth 3 (three) times daily. (Patient not taking: Reported on 05/29/2018) 90 tablet 2  . Oxycodone HCl 10 MG TABS Take 2 tablets (20 mg total) by mouth 4 (four) times daily. 240 tablet 0  . polyethylene glycol (MIRALAX / GLYCOLAX) packet Take 17 g by mouth daily. Use OTC Clearax 17 g po daily 30 each 2  . ranitidine (ZANTAC) 300 MG tablet Take 1 tablet (300 mg total) by mouth at bedtime. (Patient not taking: Reported on 05/29/2018) 30 tablet 1  . ZYTIGA 250 MG tablet TAKE 4 TABLETS BY MOUTH ONCE DAILY AS DIRECTED.  TAKE 1 HOUR BEFORE OR 2 HOURS AFTER A MEAL 120 tablet 11   No current facility-administered medications for this visit.      Allergies: No Known Allergies  Past Medical History, Surgical history, Social history, and Family History were reviewed and updated.    Physical Exam:     ECOG: 1   General appearance: Alert, awake without any distress. Head: Atraumatic without abnormalities Oropharynx: Without any thrush or ulcers. Eyes: No scleral icterus. Lymph nodes: No lymphadenopathy noted in the cervical, supraclavicular, or axillary nodes Heart:regular rate and rhythm, without any murmurs or gallops.   Lung: Clear  to auscultation without any rhonchi, wheezes or dullness to percussion. Abdomin: Soft, nontender without any shifting dullness or ascites. Musculoskeletal: No clubbing or cyanosis. Neurological: No motor or sensory deficits. Skin: No rashes or lesions.       Lab Results: Lab Results  Component Value Date   WBC 6.0 03/23/2018   HGB 14.9 03/23/2018   HCT 43.9 03/23/2018   MCV 84.7 03/23/2018   PLT 260 03/23/2018     Chemistry      Component Value  Date/Time   NA 143 03/23/2018 1109   K 4.3 03/23/2018 1109   CL 109 03/23/2018 1109   CO2 27 03/23/2018 1109   BUN 16 03/23/2018 1109   CREATININE 1.19 03/23/2018 1109      Component Value Date/Time   CALCIUM 9.9 03/23/2018 1109   ALKPHOS 207 (H) 03/23/2018 1109   AST 28 03/23/2018 1109   ALT 26 03/23/2018 1109   BILITOT 0.5 03/23/2018 1109      Results for Jonathan Campbell (MRN [id number]) as of 06/06/2018 11:29  Ref. Range 01/19/2018 11:21 03/23/2018 11:09  Prostate Specific Ag, Serum Latest Ref Range: 0.0 - 4.0 ng/mL 40.4 (H) 11.4 (H)     Impression and Plan:  57 year old man with  1.    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    He is currently on Zytiga which she has tolerated reasonably well with excellent PSA response.  His PSA is down to 11.4 and September 2019.  Risks and benefits of continuing this treatment was reviewed today and is agreeable to continue.  2.  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  Pain remains reasonably controlled.  He has follow-up with Dr. Sondra Come for possible repeat radiation to other and affected area including his shoulder.   3.  Bone directed therapy: Risks and benefits of Xgeva was reviewed today including osteonecrosis of the jaw and hypocalcemia.  This option will be deferred for the time being.  4.  Androgen deprivation: He remains on Lupron every 4 months.  He is due today and will be repeated every 4 months.  Long-term comp occasions associated with this therapy including osteoporosis among others.  6.  Follow-up: We will be in 4 months for repeat evaluation.  15  minutes was spent with the patient face-to-face today.  More than 50% of time was dedicated to discussing his disease status, treatment options and complications related to therapy.   Zola Button, MD 12/10/201911:27 AM

## 2018-06-06 NOTE — Patient Instructions (Signed)

## 2018-06-07 ENCOUNTER — Telehealth: Payer: Self-pay | Admitting: *Deleted

## 2018-06-07 LAB — PROSTATE-SPECIFIC AG, SERUM (LABCORP): Prostate Specific Ag, Serum: 8.5 ng/mL — ABNORMAL HIGH (ref 0.0–4.0)

## 2018-06-07 NOTE — Telephone Encounter (Signed)
CALLED PATIENT TO INFORM OF BONE SCAN FOR 06-16-18- ARRIVAL TIME- 11:45 AM @ [facility], NO RESTRICTIONS TO TEST, PT. TO RETURN @ 3 PM TO BE SCANNED AND HE WILL FU WITH DR. KINARD FOR RESULTS ON 06-26-18 @ 4 PM, SPOKE WITH PATIENT AND HE VERIFIED UNDERSTANDING THIS

## 2018-06-15 MED FILL — PROAIR HFA 90 MCG INHALER: 108 (90 BAS | 16 days supply | Qty: 9 | Fill #0

## 2018-06-16 ENCOUNTER — Encounter (HOSPITAL_COMMUNITY): Payer: Medicaid Other

## 2018-06-16 ENCOUNTER — Encounter (HOSPITAL_COMMUNITY)
Admission: RE | Admit: 2018-06-16 | Discharge: 2018-06-16 | Disposition: A | Discharge status: Home / Self Care | Payer: Medicaid Other | Source: Ambulatory Visit | Attending: Radiation Oncology | Admitting: Radiation Oncology

## 2018-06-16 DIAGNOSIS — C7951 Secondary malignant neoplasm of bone: Secondary | ICD-10-CM | POA: Diagnosis present

## 2018-06-16 DIAGNOSIS — C61 Malignant neoplasm of prostate: Secondary | ICD-10-CM | POA: Insufficient documentation

## 2018-06-16 MED ORDER — TECHNETIUM TC 99M MEDRONATE IV KIT
20.0000 | PACK | Freq: Once | INTRAVENOUS | Status: AC | PRN
  Administered 2018-06-16: 17 via INTRAVENOUS

## 2018-06-26 ENCOUNTER — Encounter: Payer: Self-pay | Admitting: Radiation Oncology

## 2018-06-26 ENCOUNTER — Ambulatory Visit
Admission: RE | Admit: 2018-06-26 | Discharge: 2018-06-26 | Disposition: A | Discharge status: Home / Self Care | Payer: Medicaid Other | Source: Ambulatory Visit | Attending: Radiation Oncology | Admitting: Radiation Oncology

## 2018-06-26 ENCOUNTER — Other Ambulatory Visit: Payer: Self-pay

## 2018-06-26 VITALS — BP 137/94 | HR 85 | Temp 97.9°F | Resp 18 | Wt 178.5 lb

## 2018-06-26 DIAGNOSIS — C7989 Secondary malignant neoplasm of other specified sites: Secondary | ICD-10-CM | POA: Diagnosis present

## 2018-06-26 DIAGNOSIS — G8929 Other chronic pain: Secondary | ICD-10-CM

## 2018-06-26 DIAGNOSIS — C7951 Secondary malignant neoplasm of bone: Secondary | ICD-10-CM | POA: Insufficient documentation

## 2018-06-26 DIAGNOSIS — C801 Malignant (primary) neoplasm, unspecified: Secondary | ICD-10-CM

## 2018-06-26 DIAGNOSIS — Z79899 Other long term (current) drug therapy: Secondary | ICD-10-CM | POA: Insufficient documentation

## 2018-06-26 DIAGNOSIS — C61 Malignant neoplasm of prostate: Secondary | ICD-10-CM

## 2018-06-26 DIAGNOSIS — M25511 Pain in right shoulder: Secondary | ICD-10-CM

## 2018-06-26 MED ORDER — OXYCODONE HCL 10 MG PO TABS: 20.0000 mg | ORAL_TABLET | Freq: Four times a day (QID) | ORAL | 0 refills | Status: DC

## 2018-06-26 MED FILL — oxyCODONE HCL 10 MG TABS: 10 | 30 days supply | Qty: 240 | Fill #0

## 2018-06-26 NOTE — Progress Notes (Signed)
Radiation Oncology         (336) [phone] ________________________________  Name: Jonathan Campbell MRN: [id number]  Date: 06/26/2018  DOB: 05-24-1961  Follow-Up Visit Note  CC: Patient, No Pcp Per  Jonathan Polite, MD    ICD-10-CM   1. 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  He notes that his pain is exacerbated with the cold weather. He reports right shoulder pain and denies prior shoulder injury. His right shoulder pain is described as aching and he notes that his right shoulder pain is exacerbated with movement and laying on it. He has a new PCP, Jonathan Campbell, who he would like to speak to about the pain management clinic. He is out of his medications and would like a refill today.   He had a bone scan on 06/16/2018 that showed: Interval response to therapy. There is been significant improvement in abnormal uptake within the left shoulder, thoracic and lumbar spine. No new or progressive disease identified   ALLERGIES:  has No Known Allergies.  Meds: Current Outpatient Medications  Medication Sig Dispense Refill  . acetaminophen (TYLENOL) 325 MG tablet Take 2 tablets (650 mg total) by mouth every 6 (six) hours as needed for mild pain. 120 tablet 0  . methocarbamol (ROBAXIN) 750 MG tablet Take 1 tablet (750 mg total) by mouth 3 (three) times daily. 90 tablet 2  . Oxycodone HCl 10 MG TABS Take 2 tablets (20 mg total) by mouth 4 (four) times daily. 240 tablet 0  . ZYTIGA 250 MG tablet TAKE 4 TABLETS BY MOUTH ONCE DAILY AS DIRECTED.  TAKE 1 HOUR BEFORE OR 2 HOURS AFTER A MEAL 120 tablet 11  . bicalutamide (CASODEX) 50 MG tablet Take 1  tablet (50 mg total) by mouth daily. (Patient not taking: Reported on 05/29/2018) 30 tablet 0  . polyethylene glycol (MIRALAX / GLYCOLAX) packet Take 17 g by mouth daily. Use OTC Clearax 17 g po daily (Patient not taking: Reported on 06/26/2018) 30 each 2  . ranitidine (ZANTAC) 300 MG tablet Take 1 tablet (300 mg total) by mouth at bedtime. (Patient not taking: Reported on 05/29/2018) 30 tablet 1   No current facility-administered medications for this encounter.     Physical Findings: The patient is in no acute distress. Patient is alert and oriented.  weight is 178 lb 8 oz (81 kg). His oral temperature is 97.9 F (36.6 C). His blood pressure is 137/94 (abnormal) and his pulse is 85. His respiration is 18 and oxygen saturation is 99%.   Lungs are clear to auscultation bilaterally. Heart has regular rate and rhythm. No palpable cervical, supraclavicular, or axillary adenopathy. Abdomen soft, non-tender, normal bowel sounds. Tenderness with palpation of right anterior shoulder.   Lab Findings: Lab Results  Component Value Date   WBC 6.4 06/06/2018   HGB 14.9 06/06/2018   HCT 43.2 06/06/2018   MCV 82.9 06/06/2018   PLT 272 06/06/2018    Radiographic Findings: Nm Bone Scan Whole Body  Result Date: 06/16/2018 CLINICAL DATA:  Prostate cancer.  PSA 8.5.  Low back pain. EXAM: NUCLEAR MEDICINE WHOLE BODY BONE SCAN TECHNIQUE: Whole body  anterior and posterior images were obtained approximately 3 hours after intravenous injection of radiopharmaceutical. RADIOPHARMACEUTICALS:  17.0 mCi Technetium-22m MDP IV COMPARISON:  11/15/2017 FINDINGS: Interval improvement in previously noted multifocal areas of abnormal increased uptake compatible with improving bone metastasis. Previously noted abnormal focus of increased uptake within the left shoulder has resolved in the interval. Decrease in size and degree of radiotracer uptake associated with T12 and L4 metastasis. Small focus of abnormal uptake within the  lower sacrum has resolved in the interval. There is a large focus of mild uptake localizing to the posterior calvarium a which is more conspicuous on today's study. This is favored to reflect positional changes of the skull. Normal physiologic tracer activity seen within the kidneys an urinary bladder. IMPRESSION: 1. Interval response to therapy. There is been significant improvement in abnormal uptake within the left shoulder, thoracic and lumbar spine. 2. No new or progressive disease identified. Electronically Signed   By: Kerby Moors M.D.   On: 06/16/2018 16:53    Impression: Metastatic prostate cancer with osseous metastasis, clinically stable    Continuing to have pain to his right shoulder, despite bone scan showing improvement. Will schedule a MRI for further evaluation of right shoulder. Refilled Oxycodone today.    Plan: Scheduled for a MRI. Routine follow up after MRI.   ____________________________________  Blair Promise, PhD, MD  This document serves as a record of services personally performed by Gery Pray, MD. It was created on his behalf by [facility], a trained medical scribe. The creation of this record is based on the scribe's personal observations and the provider's statements to them. This document has been checked and approved by the attending provider.

## 2018-06-26 NOTE — Progress Notes (Signed)
Jonathan Campbell is here for a follow-up appointment today with Dr. Sondra Come.Jonathan Campbell reports that he is having pain from his shoulder all the way down his back on his rt side. Patient reports mild fatigue. Patient denies any nausea or vomiting. Patient denies any issues with his bowels. Patient reports having heat and cold flashes. Vitals:   06/26/18 1520  BP: (!) 137/94  Pulse: 85  Resp: 18  Temp: 97.9 F (36.6 C)  TempSrc: Oral  SpO2: 99%  Weight: 178 lb 8 oz (81 kg)   Wt Readings from Last 3 Encounters:  06/26/18 178 lb 8 oz (81 kg)  05/29/18 182 lb 3.2 oz (82.6 kg)  05/01/18 183 lb 12.8 oz (83.4 kg)

## 2018-07-06 ENCOUNTER — Telehealth: Payer: Self-pay | Admitting: *Deleted

## 2018-07-06 NOTE — Telephone Encounter (Signed)
CALLED PATIENT TO INFORM OF MRI FOR 07-12-18 - ARRIVAL TIME - 11:30 AM @ [facility] AND HIS VISIT WITH DR. KINARD FOR RESULTS ON 07-13-18 @ 4:15 PM, SPOKE WITH PATIENT AND HE IS AWARE OF THESE APPTS.

## 2018-07-12 ENCOUNTER — Ambulatory Visit (HOSPITAL_COMMUNITY)
Admission: RE | Admit: 2018-07-12 | Discharge: 2018-07-12 | Disposition: A | Discharge status: Home / Self Care | Payer: Medicaid Other | Source: Ambulatory Visit | Attending: Radiation Oncology | Admitting: Radiation Oncology

## 2018-07-12 DIAGNOSIS — M25511 Pain in right shoulder: Secondary | ICD-10-CM | POA: Diagnosis present

## 2018-07-12 DIAGNOSIS — G8929 Other chronic pain: Secondary | ICD-10-CM

## 2018-07-13 ENCOUNTER — Encounter: Payer: Self-pay | Admitting: Radiation Oncology

## 2018-07-13 ENCOUNTER — Other Ambulatory Visit: Payer: Self-pay

## 2018-07-13 ENCOUNTER — Ambulatory Visit
Admission: RE | Admit: 2018-07-13 | Discharge: 2018-07-13 | Disposition: A | Discharge status: Home / Self Care | Payer: Medicaid Other | Source: Ambulatory Visit | Attending: Radiation Oncology | Admitting: Radiation Oncology

## 2018-07-13 VITALS — BP 128/89 | HR 110 | Temp 98.8°F | Resp 20 | Ht 69.0 in | Wt 186.4 lb

## 2018-07-13 DIAGNOSIS — M545 Low back pain: Secondary | ICD-10-CM | POA: Insufficient documentation

## 2018-07-13 DIAGNOSIS — Z79899 Other long term (current) drug therapy: Secondary | ICD-10-CM | POA: Insufficient documentation

## 2018-07-13 DIAGNOSIS — Z923 Personal history of irradiation: Secondary | ICD-10-CM | POA: Diagnosis not present

## 2018-07-13 DIAGNOSIS — C61 Malignant neoplasm of prostate: Secondary | ICD-10-CM | POA: Diagnosis not present

## 2018-07-13 DIAGNOSIS — C801 Malignant (primary) neoplasm, unspecified: Secondary | ICD-10-CM

## 2018-07-13 DIAGNOSIS — C7951 Secondary malignant neoplasm of bone: Secondary | ICD-10-CM | POA: Diagnosis present

## 2018-07-13 DIAGNOSIS — C7989 Secondary malignant neoplasm of other specified sites: Secondary | ICD-10-CM

## 2018-07-13 DIAGNOSIS — M25511 Pain in right shoulder: Secondary | ICD-10-CM | POA: Insufficient documentation

## 2018-07-13 MED ORDER — OXYCODONE HCL 10 MG PO TABS: 20.0000 mg | ORAL_TABLET | Freq: Four times a day (QID) | ORAL | 0 refills | Status: DC

## 2018-07-13 MED FILL — oxyCODONE HCL 10 MG TABS: 10 | 30 days supply | Qty: 240 | Fill #0
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
## 2018-07-13 NOTE — Progress Notes (Signed)
Pt here today for a follow-up appointment and to review MRI results. Pt states that his pain is a 6 out of 10 on the pain scale. Pt denies having any pain. Pt states that the pain is in his right shoulder and his back.   BP 128/89 (BP Location: Right Arm, Patient Position: Sitting)   Pulse (!) 110   Temp 98.8 F (37.1 C) (Oral)   Resp 20   Ht 5\' 9"  (1.753 m)   Wt 186 lb 6.4 oz (84.6 kg)   SpO2 100%   BMI 27.53 kg/m    Wt Readings from Last 3 Encounters:  07/13/18 186 lb 6.4 oz (84.6 kg)  06/26/18 178 lb 8 oz (81 kg)  05/29/18 182 lb 3.2 oz (82.6 kg)

## 2018-07-18 ENCOUNTER — Ambulatory Visit
Admission: RE | Admit: 2018-07-18 | Discharge: 2018-07-18 | Disposition: A | Discharge status: Home / Self Care | Payer: Medicaid Other | Source: Ambulatory Visit | Attending: Radiation Oncology | Admitting: Radiation Oncology

## 2018-07-18 DIAGNOSIS — Z79899 Other long term (current) drug therapy: Secondary | ICD-10-CM | POA: Diagnosis not present

## 2018-07-18 DIAGNOSIS — C61 Malignant neoplasm of prostate: Secondary | ICD-10-CM

## 2018-07-18 DIAGNOSIS — C7951 Secondary malignant neoplasm of bone: Secondary | ICD-10-CM | POA: Insufficient documentation

## 2018-07-23 NOTE — Progress Notes (Signed)
  Radiation Oncology         [phone] ________________________________  Name: Jonathan Campbell MRN: [id number]  Date: 07/18/2018  DOB: 06-16-1961  SIMULATION AND TREATMENT PLANNING NOTE    ICD-10-CM   1. Prostate cancer metastatic to bone [facility]) C61    C79.51     DIAGNOSIS: Metastatic prostate cancer with painful osseous metastasis  NARRATIVE:  The patient was brought to the [facility].  Identity was confirmed.  All relevant records and images related to the planned course of therapy were reviewed.  The patient freely provided informed written consent to proceed with treatment after reviewing the details related to the planned course of therapy. The consent form was witnessed and verified by the simulation staff.  Then, the patient was set-up in a stable reproducible  supine position for radiation therapy.  CT images were obtained.  Surface markings were placed.  The CT images were loaded into the planning software.  Then the target and avoidance structures were contoured.  Treatment planning then occurred.  The radiation prescription was entered and confirmed.  Then, I designed and supervised the construction of a total of 3 medically necessary complex treatment devices.  I have requested : Isodose Plan.  I have ordered: Dose Calc.  PLAN:  The patient will receive 30 Gy in 10 fractions.  -----------------------------------  Blair Promise, PhD, MD

## 2018-07-24 DIAGNOSIS — C61 Malignant neoplasm of prostate: Secondary | ICD-10-CM | POA: Diagnosis not present

## 2018-07-26 ENCOUNTER — Ambulatory Visit
Admission: RE | Admit: 2018-07-26 | Discharge: 2018-07-26 | Disposition: A | Discharge status: Home / Self Care | Payer: Medicaid Other | Source: Ambulatory Visit | Attending: Radiation Oncology | Admitting: Radiation Oncology

## 2018-07-26 DIAGNOSIS — C61 Malignant neoplasm of prostate: Secondary | ICD-10-CM | POA: Diagnosis not present

## 2018-07-26 DIAGNOSIS — C7951 Secondary malignant neoplasm of bone: Principal | ICD-10-CM

## 2018-07-26 NOTE — Progress Notes (Signed)
  Radiation Oncology         [phone] ________________________________  Name: Christopherjame Carnell MRN: [id number]  Date: 07/26/2018  DOB: 1960/11/15  Simulation Verification Note    ICD-10-CM   1. Prostate cancer metastatic to bone [facility]) C61    C79.51     Status: outpatient  NARRATIVE: The patient was brought to the treatment unit and placed in the planned treatment position. The clinical setup was verified. Then port films were obtained and uploaded to the radiation oncology medical record software.  The treatment beams were carefully compared against the planned radiation fields. The position location and shape of the radiation fields was reviewed. They targeted volume of tissue appears to be appropriately covered by the radiation beams. Organs at risk appear to be excluded as planned.  Based on my personal review, I approved the simulation verification. The patient's treatment will proceed as planned.  -----------------------------------  Blair Promise, PhD, MD  This document serves as a record of services personally performed by Gery Pray, MD. It was created on his behalf by Mary-Margaret Loma Messing, a trained medical scribe. The creation of this record is based on the scribe's personal observations and the provider's statements to them. This document has been checked and approved by the attending provider.

## 2018-07-27 ENCOUNTER — Ambulatory Visit
Admission: RE | Admit: 2018-07-27 | Discharge: 2018-07-27 | Disposition: A | Discharge status: Home / Self Care | Payer: Medicaid Other | Source: Ambulatory Visit | Attending: Radiation Oncology | Admitting: Radiation Oncology

## 2018-07-27 DIAGNOSIS — C61 Malignant neoplasm of prostate: Secondary | ICD-10-CM | POA: Diagnosis not present

## 2018-07-28 ENCOUNTER — Ambulatory Visit
Admission: RE | Admit: 2018-07-28 | Discharge: 2018-07-28 | Disposition: A | Discharge status: Home / Self Care | Payer: Medicaid Other | Source: Ambulatory Visit | Attending: Radiation Oncology | Admitting: Radiation Oncology

## 2018-07-28 DIAGNOSIS — C61 Malignant neoplasm of prostate: Secondary | ICD-10-CM | POA: Diagnosis not present

## 2018-07-31 ENCOUNTER — Ambulatory Visit
Admission: RE | Admit: 2018-07-31 | Discharge: 2018-07-31 | Disposition: A | Discharge status: Home / Self Care | Payer: Medicaid Other | Source: Ambulatory Visit | Attending: Radiation Oncology | Admitting: Radiation Oncology

## 2018-07-31 DIAGNOSIS — Z79899 Other long term (current) drug therapy: Secondary | ICD-10-CM | POA: Insufficient documentation

## 2018-07-31 DIAGNOSIS — C7951 Secondary malignant neoplasm of bone: Secondary | ICD-10-CM | POA: Insufficient documentation

## 2018-07-31 DIAGNOSIS — C61 Malignant neoplasm of prostate: Secondary | ICD-10-CM | POA: Insufficient documentation

## 2018-08-01 ENCOUNTER — Ambulatory Visit
Admission: RE | Admit: 2018-08-01 | Discharge: 2018-08-01 | Disposition: A | Discharge status: Home / Self Care | Payer: Medicaid Other | Source: Ambulatory Visit | Attending: Radiation Oncology | Admitting: Radiation Oncology

## 2018-08-01 ENCOUNTER — Other Ambulatory Visit: Payer: Self-pay | Admitting: Radiation Oncology

## 2018-08-01 DIAGNOSIS — C61 Malignant neoplasm of prostate: Secondary | ICD-10-CM | POA: Diagnosis not present

## 2018-08-01 MED ORDER — OXYCODONE HCL 10 MG PO TABS: 20.0000 mg | ORAL_TABLET | Freq: Four times a day (QID) | ORAL | 0 refills | Status: DC

## 2018-08-02 ENCOUNTER — Ambulatory Visit
Admission: RE | Admit: 2018-08-02 | Discharge: 2018-08-02 | Disposition: A | Discharge status: Home / Self Care | Payer: Medicaid Other | Source: Ambulatory Visit | Attending: Radiation Oncology | Admitting: Radiation Oncology

## 2018-08-02 DIAGNOSIS — C61 Malignant neoplasm of prostate: Secondary | ICD-10-CM | POA: Diagnosis not present

## 2018-08-02 MED FILL — oxyCODONE HCL 10 MG TABS: 10 | 30 days supply | Qty: 240 | Fill #0

## 2018-08-03 ENCOUNTER — Ambulatory Visit
Admission: RE | Admit: 2018-08-03 | Discharge: 2018-08-03 | Disposition: A | Discharge status: Home / Self Care | Payer: Medicaid Other | Source: Ambulatory Visit | Attending: Radiation Oncology | Admitting: Radiation Oncology

## 2018-08-03 DIAGNOSIS — C61 Malignant neoplasm of prostate: Secondary | ICD-10-CM | POA: Diagnosis not present

## 2018-08-04 ENCOUNTER — Ambulatory Visit
Admission: RE | Admit: 2018-08-04 | Discharge: 2018-08-04 | Disposition: A | Discharge status: Home / Self Care | Payer: Medicaid Other | Source: Ambulatory Visit | Attending: Radiation Oncology | Admitting: Radiation Oncology

## 2018-08-04 DIAGNOSIS — C61 Malignant neoplasm of prostate: Secondary | ICD-10-CM | POA: Diagnosis not present

## 2018-08-07 ENCOUNTER — Ambulatory Visit
Admission: RE | Admit: 2018-08-07 | Discharge: 2018-08-07 | Disposition: A | Discharge status: Home / Self Care | Payer: Medicaid Other | Source: Ambulatory Visit | Attending: Radiation Oncology | Admitting: Radiation Oncology

## 2018-08-07 DIAGNOSIS — C61 Malignant neoplasm of prostate: Secondary | ICD-10-CM | POA: Diagnosis not present

## 2018-08-08 ENCOUNTER — Ambulatory Visit
Admission: RE | Admit: 2018-08-08 | Discharge: 2018-08-08 | Disposition: A | Discharge status: Home / Self Care | Payer: Medicaid Other | Source: Ambulatory Visit | Attending: Radiation Oncology | Admitting: Radiation Oncology

## 2018-08-08 DIAGNOSIS — C61 Malignant neoplasm of prostate: Secondary | ICD-10-CM | POA: Diagnosis not present

## 2018-08-09 ENCOUNTER — Encounter: Payer: Self-pay | Admitting: Radiation Oncology

## 2018-08-09 NOTE — Progress Notes (Signed)
  Radiation Oncology         [phone] ________________________________  Name: Jonathan Campbell MRN: [id number]  Date: 08/09/2018  DOB: 01-Dec-1960  End of Treatment Note  Diagnosis:   Prostate cancer metastatic to the bone  Indication for treatment:  Palliative       Radiation treatment dates:   07/26/18-08/08/18  Site/dose:   Right superior glenoid of right shoulder; 30 Gy in 10 fractions of 3 Gy  Beams/energy:   Isodose Photon; 10X  Narrative: The patient tolerated radiation treatment relatively well.     Pt denied difficulty swallowing, or SOB throughout treatments. His pain in the right shoulder had not improved significantly at the end of treatment.  Plan: The patient has completed radiation treatment. The patient will return to radiation oncology clinic for routine followup in one month. I advised them to call or return sooner if they have any questions or concerns related to their recovery or treatment.  -----------------------------------  Blair Promise, PhD, MD  This document serves as a record of services personally performed by Gery Pray, MD. It was created on his behalf by Mary-Margaret Loma Messing, a trained medical scribe. The creation of this record is based on the scribe's personal observations and the provider's statements to them. This document has been checked and approved by the attending provider.

## 2018-08-22 ENCOUNTER — Other Ambulatory Visit: Payer: Self-pay | Admitting: Radiation Oncology

## 2018-08-22 MED ORDER — OXYCODONE HCL 10 MG PO TABS: 20.0000 mg | ORAL_TABLET | Freq: Four times a day (QID) | ORAL | 0 refills | Status: DC

## 2018-08-25 ENCOUNTER — Telehealth: Payer: Self-pay

## 2018-08-25 MED FILL — oxyCODONE HCL 10 MG TABS: 10 | 5 days supply | Qty: 40 | Fill #0

## 2018-08-25 NOTE — Telephone Encounter (Signed)
Pt presented to nursing clinic, demanding that this RN speak to Petronila re: pain medications. This RN contacted pharmacy, [facility], to ascertain the situation with pt's oxycodone. According to [facility], pt's prescription is a full 10 days early and Medicaid will not pay for script. Daphne stated the pharmacy would sell the pt 5 days supply OOP. Conveyed to [facility] that this RN would contact pt and discuss this.   Contacted pt to convey above discussion. Pt demanding full prescription and states his mother will pay for it. Conveyed to pt that his regimen was obviously not working and this RN would discuss with Dr. Sondra Come on Monday. This RN conveyed to pt that it was possible he needed a long acting and a short acting for breakthrough pain. Pt continued to loudly demand entire prescription. Pt ended call.  Will discuss above conversation with Dr. Sondra Come on Monday. Loma Sousa, RN BSN

## 2018-08-28 ENCOUNTER — Encounter (INDEPENDENT_AMBULATORY_CARE_PROVIDER_SITE_OTHER): Payer: Self-pay

## 2018-08-28 ENCOUNTER — Ambulatory Visit
Admission: RE | Admit: 2018-08-28 | Discharge: 2018-08-28 | Disposition: A | Discharge status: Home / Self Care | Payer: Medicaid Other | Source: Ambulatory Visit | Attending: Radiation Oncology | Admitting: Radiation Oncology

## 2018-08-28 ENCOUNTER — Other Ambulatory Visit: Payer: Self-pay

## 2018-08-28 ENCOUNTER — Other Ambulatory Visit: Payer: Self-pay | Admitting: Radiation Oncology

## 2018-08-28 ENCOUNTER — Encounter: Payer: Self-pay | Admitting: Radiation Oncology

## 2018-08-28 ENCOUNTER — Telehealth: Payer: Self-pay

## 2018-08-28 VITALS — BP 140/96 | HR 89 | Temp 99.1°F | Resp 18 | Ht 69.0 in | Wt 182.0 lb

## 2018-08-28 DIAGNOSIS — Z79899 Other long term (current) drug therapy: Secondary | ICD-10-CM | POA: Insufficient documentation

## 2018-08-28 DIAGNOSIS — C61 Malignant neoplasm of prostate: Secondary | ICD-10-CM

## 2018-08-28 DIAGNOSIS — C7951 Secondary malignant neoplasm of bone: Secondary | ICD-10-CM | POA: Insufficient documentation

## 2018-08-28 DIAGNOSIS — Z510 Encounter for antineoplastic radiation therapy: Secondary | ICD-10-CM | POA: Diagnosis not present

## 2018-08-28 MED ORDER — OXYCODONE HCL 10 MG PO TABS: 20.0000 mg | ORAL_TABLET | Freq: Every day | ORAL | 0 refills | Status: DC

## 2018-08-28 MED FILL — oxyCODONE HCL 10 MG TABS: 10 | 30 days supply | Qty: 360 | Fill #0

## 2018-08-28 NOTE — Telephone Encounter (Signed)
Pt's girlfriend came into nurses' station at approximately 0845, stated "he's driving me crazy". Conveyed to girlfriend that pt has an appt and this RN attempted to reach pt. Girlfriend states she will let pt know of appt. Loma Sousa, RN BSN    [phone]: pt contacted this RN via phone. Pt adamant that he needs his pain medications filled because he "has arthritis and I'm going up and down stairs all the time". Attempted to discuss with pt that he needed to speak with Dr. Sondra Come that his current regimen is not adequate to last 30 days. Attempted to convey to pt that pharmacies in the state of [place] were bound by regulations re: narcotics. Pt continued speaking about his need for pain medications. Pt verbalized understanding of appt with Dr. Sondra Come this afternoon. Loma Sousa, RN BSN

## 2018-08-28 NOTE — Progress Notes (Signed)
Radiation Oncology         (336) [phone] ________________________________  Name: Jonathan Campbell MRN: [id number]  Date: 08/28/2018  DOB: 1960/10/06  Follow-Up Visit Note  CC: Patient, No Pcp Per  Domenic Polite, MD    ICD-10-CM   1. Prostate cancer metastatic to bone [facility]) C61    C79.51     Diagnosis:   Prostate cancer metastatic to bone   Interval Since Last Radiation:  3 weeks  Radiation treatment dates:   07/26/18-08/08/18  Site/dose:   Right superior glenoid of right shoulder; 30 Gy in 10 fractions of 3 Gy  Narrative:  The patient returns today for routine follow-up.  He is accompanied by his fiance. He continues have some pain in the right shoulder area but overall this is improved his radiation therapy. Patient has diffuse pain and begins having pain approximately 4 hours after his medications have been taken. This is waking him up at night.            On review of systems, he reports aching pain, worse at night and when the weather is cold. He reports intermittent right wrist pain and swelling. he denies any other symptoms. Pertinent positives are listed and detailed within the above HPI.                 ALLERGIES:  has No Known Allergies.  Meds: Current Outpatient Medications  Medication Sig Dispense Refill  . ZYTIGA 250 MG tablet TAKE 4 TABLETS BY MOUTH ONCE DAILY AS DIRECTED.  TAKE 1 HOUR BEFORE OR 2 HOURS AFTER A MEAL 120 tablet 11  . acetaminophen (TYLENOL) 325 MG tablet Take 2 tablets (650 mg total) by mouth every 6 (six) hours as needed for mild pain. (Patient not taking: Reported on 08/28/2018) 120 tablet 0  . bicalutamide (CASODEX) 50 MG tablet Take 1 tablet (50 mg total) by mouth daily. (Patient not taking: Reported on 08/28/2018) 30 tablet 0  . methocarbamol (ROBAXIN) 750 MG tablet Take 1 tablet (750 mg total) by mouth 3 (three) times daily. (Patient not taking: Reported on 08/28/2018) 90 tablet 2  . Oxycodone HCl 10 MG TABS Take 2 tablets (20 mg total) by mouth 6 (six)  times daily. 360 tablet 0  . polyethylene glycol (MIRALAX / GLYCOLAX) packet Take 17 g by mouth daily. Use OTC Clearax 17 g po daily (Patient not taking: Reported on 08/28/2018) 30 each 2  . ranitidine (ZANTAC) 300 MG tablet Take 1 tablet (300 mg total) by mouth at bedtime. (Patient not taking: Reported on 08/28/2018) 30 tablet 1   No current facility-administered medications for this encounter.     Physical Findings: The patient is in no acute distress. Patient is alert and oriented.  height is 5\' 9"  (1.753 m) and weight is 182 lb (82.6 kg). His oral temperature is 99.1 F (37.3 C). His blood pressure is 140/96 (abnormal) and his pulse is 89. His respiration is 18 and oxygen saturation is 96%. .  No significant changes. He continues to wear a back brace which helps him to be more comfortable when ambulating  Lab Findings: Lab Results  Component Value Date   WBC 6.4 06/06/2018   HGB 14.9 06/06/2018   HCT 43.2 06/06/2018   MCV 82.9 06/06/2018   PLT 272 06/06/2018    Radiographic Findings: No results found.  Impression:  The patient is recovering from the effects of radiation.  We talked about his pain medication and agreed to switch him to every 4 hours. We  discussed long-acting long medications and the pt recalls not tolerating MS Contin because of problems with nausea. I have spoken with Shelda Altes at [facility] and will switch the pt to Oxycodone 20 mg every 4 hours. Electronic prescription will be sent for 360 tablets (1 month). Mr. Johnell Comings will check to see if the pt will be a candidate for OxyContin with his current insurance plan.  Plan:  Patient will return in mid-March for his already scheduled follow-up and we will again discuss whether he is a candidate for OxyContin at that time. We will talk about other long-acting medication such as the Fentanyl patch.  ____________________________________   Blair Promise, PhD, MD    This document serves as a record of  services personally performed by Gery Pray, MD. It was created on his behalf by Mary-Margaret Loma Messing, a trained medical scribe. The creation of this record is based on the scribe's personal observations and the provider's statements to them. This document has been checked and approved by the attending provider.

## 2018-08-28 NOTE — Telephone Encounter (Signed)
Attempted to contact pt re: need for appt today with Dr. Sondra Come to discuss pain management. No VM. Will attempt throughout day. Will also discuss with Dr. Sondra Come. Loma Sousa, RN BSN

## 2018-08-28 NOTE — Progress Notes (Signed)
Pt presents today to discuss pain regimen with Dr. Sondra Come. Pt is accompanied by Velta Addison, fiance. Pt is requesting the frequency of pain medication changed from every 6 hours to every 4 hours as the current dose is not holding him.   Pt is agreeable to a pain management referral outside the [facility] network.   BP (!) 140/96 (BP Location: Right Arm, Patient Position: Sitting)   Pulse 89   Temp 99.1 F (37.3 C) (Oral)   Resp 18   Ht 5\' 9"  (1.753 m)   Wt 182 lb (82.6 kg)   SpO2 96%   BMI 26.88 kg/m   Wt Readings from Last 3 Encounters:  08/28/18 182 lb (82.6 kg)  07/13/18 186 lb 6.4 oz (84.6 kg)  06/26/18 178 lb 8 oz (81 kg)   Loma Sousa, RN BSN

## 2018-08-29 ENCOUNTER — Other Ambulatory Visit: Payer: Self-pay | Admitting: Radiation Oncology

## 2018-08-29 MED ORDER — OXYCODONE HCL ER 20 MG PO T12A: 20.0000 mg | EXTENDED_RELEASE_TABLET | Freq: Two times a day (BID) | ORAL | 0 refills | Status: DC

## 2018-09-11 ENCOUNTER — Encounter: Payer: Self-pay | Admitting: Radiation Oncology

## 2018-09-11 ENCOUNTER — Other Ambulatory Visit: Payer: Self-pay

## 2018-09-11 ENCOUNTER — Ambulatory Visit
Admission: RE | Admit: 2018-09-11 | Discharge: 2018-09-11 | Disposition: A | Discharge status: Home / Self Care | Payer: Medicaid Other | Source: Ambulatory Visit | Attending: Radiation Oncology | Admitting: Radiation Oncology

## 2018-09-11 VITALS — BP 134/65 | HR 68 | Temp 98.2°F | Resp 20 | Ht 69.0 in | Wt 180.2 lb

## 2018-09-11 DIAGNOSIS — M79631 Pain in right forearm: Secondary | ICD-10-CM | POA: Insufficient documentation

## 2018-09-11 DIAGNOSIS — Z923 Personal history of irradiation: Secondary | ICD-10-CM | POA: Insufficient documentation

## 2018-09-11 DIAGNOSIS — R05 Cough: Secondary | ICD-10-CM | POA: Insufficient documentation

## 2018-09-11 DIAGNOSIS — Z79899 Other long term (current) drug therapy: Secondary | ICD-10-CM | POA: Diagnosis not present

## 2018-09-11 DIAGNOSIS — C61 Malignant neoplasm of prostate: Secondary | ICD-10-CM | POA: Diagnosis not present

## 2018-09-11 DIAGNOSIS — C7951 Secondary malignant neoplasm of bone: Secondary | ICD-10-CM | POA: Diagnosis present

## 2018-09-11 NOTE — Progress Notes (Signed)
Pt here today for a follow-up for palliative treatment of the right shoulder. Pt states that his pain is a 6 out of 10 on the pain scale. Pt states having mild fatigue. Pt states soreness in his his right shoulder and milted range of motion. Pt states that he has swelling to his right wrist with limited movement. Pt states that his skin has some redness and itching. Will give pt sonafine lotion for the itching and redness.  BP 134/65 (BP Location: Right Arm, Patient Position: Sitting)   Pulse 68   Temp 98.2 F (36.8 C) (Oral)   Resp 20   Ht 5\' 9"  (1.753 m)   Wt 180 lb 3.2 oz (81.7 kg)   SpO2 98%   BMI 26.61 kg/m    Wt Readings from Last 3 Encounters:  09/11/18 180 lb 3.2 oz (81.7 kg)  08/28/18 182 lb (82.6 kg)  07/13/18 186 lb 6.4 oz (84.6 kg)

## 2018-09-11 NOTE — Progress Notes (Signed)
Radiation Oncology         [phone] ________________________________  Name: Jonathan Campbell MRN: [id number]  Date: 09/11/2018  DOB: 06-Mar-1961  Follow-Up Visit Note  CC: Patient, No Pcp Per  Domenic Polite, MD    ICD-10-CM   1. Prostate cancer metastatic to bone [facility]) C61    C79.51     Diagnosis:   Prostate cancer metastatic to bone   Interval Since Last Radiation:  1 month, 5 days   Radiation treatment dates:   07/26/18-08/08/18  Site/dose:   Right superior glenoid of right shoulder; 30 Gy in 10 fractions of 3 Gy  Narrative:  The patient returns today for routine follow-up.  He is unaccompanied today. He reports improvement with his pain control since switching his medication to every 4 hours. He is spending a lot of time in bed, but is also attempting to ambulate without his back brace. He states acquiring a PCP - Dr. Volney Presser at [facility].             On review of systems, he reports continued severe pain in his right forearm and a recent dry cough. He denies fever and any other symptoms. Pertinent positives are listed and detailed within the above HPI.                 ALLERGIES:  has No Known Allergies.  Meds: Current Outpatient Medications  Medication Sig Dispense Refill  . acetaminophen (TYLENOL) 325 MG tablet Take 2 tablets (650 mg total) by mouth every 6 (six) hours as needed for mild pain. 120 tablet 0  . bicalutamide (CASODEX) 50 MG tablet Take 1 tablet (50 mg total) by mouth daily. 30 tablet 0  . methocarbamol (ROBAXIN) 750 MG tablet Take 1 tablet (750 mg total) by mouth 3 (three) times daily. 90 tablet 2  . oxyCODONE (OXYCONTIN) 20 mg 12 hr tablet Take 1 tablet (20 mg total) by mouth every 12 (twelve) hours. 60 tablet 0  . Oxycodone HCl 10 MG TABS Take 2 tablets (20 mg total) by mouth 6 (six) times daily. 360 tablet 0  . polyethylene glycol (MIRALAX / GLYCOLAX) packet Take 17 g by mouth daily. Use OTC Clearax 17 g po daily 30 each 2  .  ranitidine (ZANTAC) 300 MG tablet Take 1 tablet (300 mg total) by mouth at bedtime. 30 tablet 1  . ZYTIGA 250 MG tablet TAKE 4 TABLETS BY MOUTH ONCE DAILY AS DIRECTED.  TAKE 1 HOUR BEFORE OR 2 HOURS AFTER A MEAL 120 tablet 11   No current facility-administered medications for this encounter.     Physical Findings: The patient is in no acute distress. Patient is alert and oriented.  height is 5\' 9"  (1.753 m) and weight is 180 lb 3.2 oz (81.7 kg). His oral temperature is 98.2 F (36.8 C). His blood pressure is 134/65 and his pulse is 68. His respiration is 20 and oxygen saturation is 98%. .  No significant changes. Lungs are clear to auscultation bilaterally. Heart has regular rate and rhythm. No palpable cervical, supraclavicular, or axillary adenopathy. Abdomen soft, non-tender, normal bowel sounds.  He continues to wear a back brace which helps him to be more comfortable when ambulating  Lab Findings: Lab Results  Component Value Date   WBC 6.4 06/06/2018   HGB 14.9 06/06/2018   HCT 43.2 06/06/2018   MCV 82.9 06/06/2018   PLT 272 06/06/2018    Radiographic Findings: No results found.  Impression:  Metastatic  prostate cancer. The patient seems to be much more comfortable with dosing his medication Q4 hours. We will check with [place] to see if he has been approved for OxyContin. The patient will see Dr. Alen Blew and his PCP in the near future.  Plan: Routine follow-up in radiation oncology in 3 months.  ____________________________________   Blair Promise, PhD, MD    This document serves as a record of services personally performed by Gery Pray, MD. It was created on his behalf by Mary-Margaret Loma Messing, a trained medical scribe. The creation of this record is based on the scribe's personal observations and the provider's statements to them. This document has been checked and approved by the attending provider.

## 2018-09-28 ENCOUNTER — Other Ambulatory Visit: Payer: Self-pay | Admitting: Radiation Oncology

## 2018-09-28 MED ORDER — OXYCODONE HCL ER 20 MG PO T12A: 20.0000 mg | EXTENDED_RELEASE_TABLET | Freq: Two times a day (BID) | ORAL | 0 refills | Status: DC

## 2018-09-28 MED ORDER — OXYCODONE HCL 10 MG PO TABS: 20.0000 mg | ORAL_TABLET | Freq: Every day | ORAL | 0 refills | Status: DC

## 2018-09-28 MED FILL — OxyCONTIN 20 MG T12A: 20 | 30 days supply | Qty: 60 | Fill #0

## 2018-09-28 MED FILL — oxyCODONE HCL 10 MG TABS: 10 | 30 days supply | Qty: 360 | Fill #0

## 2018-10-03 ENCOUNTER — Ambulatory Visit: Payer: Medicaid Other

## 2018-10-03 ENCOUNTER — Other Ambulatory Visit: Payer: Medicaid Other

## 2018-10-03 ENCOUNTER — Ambulatory Visit: Payer: Medicaid Other | Admitting: Oncology

## 2018-10-11 ENCOUNTER — Inpatient Hospital Stay: Payer: Medicaid Other | Attending: Oncology

## 2018-10-11 ENCOUNTER — Inpatient Hospital Stay: Payer: Medicaid Other | Admitting: Oncology

## 2018-10-11 ENCOUNTER — Inpatient Hospital Stay: Payer: Medicaid Other

## 2018-10-11 ENCOUNTER — Other Ambulatory Visit: Payer: Self-pay

## 2018-10-18 ENCOUNTER — Telehealth: Payer: Self-pay | Admitting: Oncology

## 2018-10-18 NOTE — Telephone Encounter (Signed)
Ok to do a phone visit on 5/1

## 2018-10-18 NOTE — Telephone Encounter (Signed)
Returning patient's phone call about rescheduling an appointment, left a voicemail.

## 2018-10-18 NOTE — Telephone Encounter (Signed)
Patient called regarding voicemail that was left, patient wanted to reschedule appointments that were missed on 04/15. Patient has been rescheduled for 05/01 and would prefer the follow up to be a telephone visit.   Message to provider.

## 2018-10-26 ENCOUNTER — Telehealth: Payer: Self-pay

## 2018-10-26 ENCOUNTER — Other Ambulatory Visit: Payer: Self-pay | Admitting: Radiation Oncology

## 2018-10-26 MED ORDER — OXYCODONE HCL ER 20 MG PO T12A: 20.0000 mg | EXTENDED_RELEASE_TABLET | Freq: Two times a day (BID) | ORAL | 0 refills | Status: DC

## 2018-10-26 MED ORDER — OXYCODONE HCL 10 MG PO TABS: 20.0000 mg | ORAL_TABLET | Freq: Every day | ORAL | 0 refills | Status: DC

## 2018-10-26 MED FILL — OxyCONTIN 20 MG T12A: 20 | 30 days supply | Qty: 60 | Fill #0

## 2018-10-26 MED FILL — oxyCODONE HCL 10 MG TABS: 10 | 30 days supply | Qty: 360 | Fill #0

## 2018-10-26 NOTE — Telephone Encounter (Signed)
Attempted to contact pt after receiving 2 VM from pt and 1 VM from girlfriend re: pain medications. Left VM that pain medications had been sent electronically to pharmacy. Loma Sousa, RN BSN

## 2018-10-27 ENCOUNTER — Other Ambulatory Visit: Payer: Self-pay

## 2018-10-27 ENCOUNTER — Inpatient Hospital Stay: Payer: Medicaid Other | Attending: Oncology | Admitting: Oncology

## 2018-10-27 ENCOUNTER — Inpatient Hospital Stay: Payer: Medicaid Other

## 2018-10-27 VITALS — BP 133/90 | HR 100 | Temp 99.2°F | Resp 18 | Ht 69.0 in | Wt 181.3 lb

## 2018-10-27 DIAGNOSIS — Z79818 Long term (current) use of other agents affecting estrogen receptors and estrogen levels: Secondary | ICD-10-CM | POA: Diagnosis not present

## 2018-10-27 DIAGNOSIS — Z923 Personal history of irradiation: Secondary | ICD-10-CM | POA: Insufficient documentation

## 2018-10-27 DIAGNOSIS — R591 Generalized enlarged lymph nodes: Secondary | ICD-10-CM | POA: Diagnosis not present

## 2018-10-27 DIAGNOSIS — C7951 Secondary malignant neoplasm of bone: Principal | ICD-10-CM

## 2018-10-27 DIAGNOSIS — C61 Malignant neoplasm of prostate: Secondary | ICD-10-CM | POA: Insufficient documentation

## 2018-10-27 DIAGNOSIS — Z79899 Other long term (current) drug therapy: Secondary | ICD-10-CM | POA: Diagnosis not present

## 2018-10-27 LAB — CMP (CANCER CENTER ONLY)
ALT: 26 U/L (ref 0–44)
AST: 27 U/L (ref 15–41)
Albumin: 3.6 g/dL (ref 3.5–5.0)
Alkaline Phosphatase: 216 U/L — ABNORMAL HIGH (ref 38–126)
Anion gap: 9 (ref 5–15)
BUN: 19 mg/dL (ref 6–20)
CO2: 24 mmol/L (ref 22–32)
Calcium: 9.4 mg/dL (ref 8.9–10.3)
Chloride: 110 mmol/L (ref 98–111)
Creatinine: 1.23 mg/dL (ref 0.61–1.24)
GFR, Est AFR Am: >60 mL/min (ref >60)
GFR, Estimated: >60 mL/min (ref >60)
Glucose, Bld: 127 mg/dL — ABNORMAL HIGH (ref 70–99)
Potassium: 3.6 mmol/L (ref 3.5–5.1)
Sodium: 143 mmol/L (ref 135–145)
Total Bilirubin: 0.4 mg/dL (ref 0.3–1.2)
Total Protein: 8.3 g/dL — ABNORMAL HIGH (ref 6.5–8.1)

## 2018-10-27 LAB — CBC WITH DIFFERENTIAL (CANCER CENTER ONLY)
Abs Immature Granulocytes: 0.06 10*3/uL (ref 0.00–0.07)
Basophils Absolute: 0.1 10*3/uL (ref 0.0–0.1)
Basophils Relative: 1 %
Eosinophils Absolute: 0.3 10*3/uL (ref 0.0–0.5)
Eosinophils Relative: 5 %
HCT: 42.3 % (ref 39.0–52.0)
Hemoglobin: 14.4 g/dL (ref 13.0–17.0)
Immature Granulocytes: 1 %
Lymphocytes Relative: 22 %
Lymphs Abs: 1.4 10*3/uL (ref 0.7–4.0)
MCH: 28.4 pg (ref 26.0–34.0)
MCHC: 34 g/dL (ref 30.0–36.0)
MCV: 83.4 fL (ref 80.0–100.0)
Monocytes Absolute: 0.5 10*3/uL (ref 0.1–1.0)
Monocytes Relative: 8 %
Neutro Abs: 3.9 10*3/uL (ref 1.7–7.7)
Neutrophils Relative %: 63 %
Platelet Count: 246 10*3/uL (ref 150–400)
RBC: 5.07 MIL/uL (ref 4.22–5.81)
RDW: 13.7 % (ref 11.5–15.5)
WBC Count: 6.2 10*3/uL (ref 4.0–10.5)
nRBC: 0 % (ref 0.0–0.2)

## 2018-10-27 MED ORDER — LEUPROLIDE ACETATE (4 MONTH) 30 MG IM KIT
30.0000 mg | PACK | Freq: Once | INTRAMUSCULAR | Status: AC
  Administered 2018-10-27: 30 mg via INTRAMUSCULAR
  Filled 2018-10-27: qty 30

## 2018-10-27 NOTE — Progress Notes (Signed)
Hematology and Oncology Follow Up Visit  Jonathan Campbell [id number] [birth year] 58 y.o. 10/27/2018 12:42 PM Patient, No Pcp PerNo ref. provider found   Principle Diagnosis: 58 year old man with advanced prostate cancer with disease to the bone and lymphadenopathy diagnosed in March 2019.  He was found to have a PSA of 800.     Prior Therapy:  Radiation therapy between September 16, 2017 and September 29, 2017 to complete 230 Gy in 10 fractions to thoracic spine.   He completed 4 weeks of Casodex between April and May 2019.  Current therapy:  Lupron 30 mg every 4 months started on 10/05/2017.  Zytiga 1000 mg daily started in May 2019.  Interim History: Jonathan Campbell is here for a follow-up visit.  Since her last visit, he reports no major changes in his health.  He continues to tolerate Zytiga without any new complaints.  He denies any excessive fatigue or tiredness.  He denies any weakness or lower extremity edema.  His appetite and performance status is excellent.  He continues to have no difficulties obtaining Zytiga at this time.  He does report some back discomfort which is chronic and manageable at this time.  He continues to wear back brace.  He denied any alteration mental status, neuropathy, confusion or dizziness.  Denies any headaches or lethargy.  Denies any night sweats, weight loss or changes in appetite.  Denied orthopnea, dyspnea on exertion or chest discomfort.  Denies shortness of breath, difficulty breathing hemoptysis or cough.  Denies any abdominal distention, nausea, early satiety or dyspepsia.  Denies any hematuria, frequency, dysuria or nocturia.  Denies any skin irritation, dryness or rash.  Denies any ecchymosis or petechiae.  Denies any lymphadenopathy or clotting.  Denies any heat or cold intolerance.  Denies any anxiety or depression.  Remaining review of system is negative.      Medications: I have reviewed the patient's current medications.  Current Outpatient Medications   Medication Sig Dispense Refill  . acetaminophen (TYLENOL) 325 MG tablet Take 2 tablets (650 mg total) by mouth every 6 (six) hours as needed for mild pain. 120 tablet 0  . bicalutamide (CASODEX) 50 MG tablet Take 1 tablet (50 mg total) by mouth daily. 30 tablet 0  . methocarbamol (ROBAXIN) 750 MG tablet Take 1 tablet (750 mg total) by mouth 3 (three) times daily. 90 tablet 2  . oxyCODONE (OXYCONTIN) 20 mg 12 hr tablet Take 1 tablet (20 mg total) by mouth every 12 (twelve) hours. 60 tablet 0  . Oxycodone HCl 10 MG TABS Take 2 tablets (20 mg total) by mouth 6 (six) times daily. 360 tablet 0  . polyethylene glycol (MIRALAX / GLYCOLAX) packet Take 17 g by mouth daily. Use OTC Clearax 17 g po daily 30 each 2  . ranitidine (ZANTAC) 300 MG tablet Take 1 tablet (300 mg total) by mouth at bedtime. 30 tablet 1  . ZYTIGA 250 MG tablet TAKE 4 TABLETS BY MOUTH ONCE DAILY AS DIRECTED.  TAKE 1 HOUR BEFORE OR 2 HOURS AFTER A MEAL 120 tablet 11   No current facility-administered medications for this visit.      Allergies: No Known Allergies  Past Medical History, Surgical history, Social history, and Family History were reviewed and updated.    Physical Exam:  Blood pressure 133/90, pulse 100, temperature 99.2 F (37.3 C), temperature source Oral, resp. rate 18, height 5\' 9"  (1.753 m), weight 181 lb 4.8 oz (82.2 kg), SpO2 97 %.     ECOG: 1  General appearance: Comfortable appearing without any discomfort Head: Normocephalic without any trauma Oropharynx: Mucous membranes are moist and pink without any thrush or ulcers. Eyes: Pupils are equal and round reactive to light. Lymph nodes: No cervical, supraclavicular, inguinal or axillary lymphadenopathy.   Heart:regular rate and rhythm.  S1 and S2 without leg edema. Lung: Clear without any rhonchi or wheezes.  No dullness to percussion. Abdomin: Soft, nontender, nondistended with good bowel sounds.  No hepatosplenomegaly. Musculoskeletal: No  joint deformity or effusion.  Full range of motion noted. Neurological: No deficits noted on motor, sensory and deep tendon reflex exam. Skin: No petechial rash or dryness.  Appeared moist.         Lab Results: Lab Results  Component Value Date   WBC 6.4 06/06/2018   HGB 14.9 06/06/2018   HCT 43.2 06/06/2018   MCV 82.9 06/06/2018   PLT 272 06/06/2018     Chemistry      Component Value Date/Time   NA 143 06/06/2018 1120   K 3.8 06/06/2018 1120   CL 107 06/06/2018 1120   CO2 27 06/06/2018 1120   BUN 13 06/06/2018 1120   CREATININE 1.40 (H) 06/06/2018 1120      Component Value Date/Time   CALCIUM 9.7 06/06/2018 1120   ALKPHOS 233 (H) 06/06/2018 1120   AST 25 06/06/2018 1120   ALT 24 06/06/2018 1120   BILITOT 0.4 06/06/2018 1120      Results for Jonathan, Campbell (MRN [id number]) as of 10/27/2018 12:44  Ref. Range 01/19/2018 11:21 03/23/2018 11:09 06/06/2018 11:20  Prostate Specific Ag, Serum Latest Ref Range: 0.0 - 4.0 ng/mL 40.4 (H) 11.4 (H) 8.5 (H)      Impression and Plan:  58 year old man with  1.  Advanced prostate cancer with disease to the bone and lymphadenopathy diagnosed in March 2019.  He has   Castration-sensitive disease.   He remains on Zytiga with excellent PSA response at this time.  His PSA continues to decline without any major complications.  Risks and benefits of this treatment was discussed today.  Potential complications were also reviewed long-term which includes edema, renal insufficiency among others.  2.  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 Sondra Come.  No issues reported at this time.  His next follow-up is in June 2020.   3.  Bone directed therapy: Remains on calcium and vitamin D supplements.  Delton See will be deferred till he obtains dental clearance.  4.  Androgen deprivation: Risks and benefits of continuing Lupron long-term was discussed today.  Potential complications include osteoporosis and  hot flashes were discussed and at this time I recommended continuing this indefinitely.  6.  Follow-up: In 4 months for repeat evaluation.  25  minutes was spent with the patient face-to-face today.  More than 50% of time was spent on reviewing his disease status, treatment options and answering questions regarding future plan of care.   Zola Button, MD 5/1/202012:42 PM

## 2018-10-27 NOTE — Patient Instructions (Signed)

## 2018-10-28 LAB — PROSTATE-SPECIFIC AG, SERUM (LABCORP): Prostate Specific Ag, Serum: 6.8 ng/mL — ABNORMAL HIGH (ref 0.0–4.0)

## 2018-11-02 ENCOUNTER — Telehealth: Payer: Self-pay | Admitting: Pharmacist

## 2018-11-02 DIAGNOSIS — C61 Malignant neoplasm of prostate: Secondary | ICD-10-CM

## 2018-11-02 MED ORDER — ZYTIGA 250 MG PO TABS: 1000.0000 mg | ORAL_TABLET | Freq: Every day | ORAL | 11 refills | Status: DC

## 2018-11-02 MED ORDER — ABIRATERONE ACETATE 250 MG PO TABS: 1000.0000 mg | ORAL_TABLET | Freq: Every day | ORAL | 11 refills | Status: DC

## 2018-11-02 NOTE — Telephone Encounter (Signed)
Oral Chemotherapy Pharmacist Encounter   Spoke with patient today to follow up regarding patient's oral chemotherapy medication: Zytiga (abiraterone) for the treatment of metastatic, castration-sensitive prostate cancer in conjunction with prednisone and androgen deprivation therapy, planned duration until disease progression or unacceptable toxicity.  Original Start date of oral chemotherapy: 11/27/2017  Pt reports 0 tablets/doses of Zytiga 250mg  tablets, 4 tablets (1000mg ) by mouth once daily on an empty stomach, 1 hour before or 2 hours after a meal, missed in the last month.   Pt reports the following side effects: none to report  Pertinent labs reviewed: OK for continued treatment.  Other Issues:   Current enrollment with [employer] patient assistance foundation to receive Zytiga at no cost to patient will end on 11/26/2018.  Patient now with active prescription insurance coverage with Medicaid, so he will not be reenrolled for manufacturer assistance.  Zytiga prescription has been sent to the [facility] long outpatient pharmacy.  Patient receives his other medications from [facility] as well.  He has almost an entire bottle of Zytiga remaining.  We have coordinated with the pharmacy for next fill of Zytiga to be available for pickup on 11/29/2018, per patient request.  All questions answered. Patient understands and is in agreement with above plan.  Patient knows to call the office with questions or concerns.  Johny Drilling, PharmD, BCPS, BCOP  11/02/2018 2:24 PM Oral Oncology Clinic [phone]

## 2018-11-02 NOTE — Telephone Encounter (Signed)
Oral Oncology Pharmacist Encounter  Received notification from oral oncology patient advocate that during process of preparing for reenrollment for manufacturer assistance program, to receive Zytiga at $0 out-of-pocket cost to patient, and had discovered that patient has active Medicaid prescription insurance coverage. We will not proceed in obtaining medication through the manufacturer. Prescription for Fabio Asa has been E scribed to the [facility] outpatient pharmacy. We will follow-up with patient later today to update about dispensing site change as well as perform follow-up counseling.  Johny Drilling, PharmD, BCPS, BCOP  11/02/2018 12:25 PM Oral Oncology Clinic [phone]

## 2018-11-21 ENCOUNTER — Other Ambulatory Visit: Payer: Self-pay

## 2018-11-21 ENCOUNTER — Encounter: Payer: Self-pay | Admitting: Radiation Oncology

## 2018-11-21 ENCOUNTER — Ambulatory Visit
Admission: RE | Admit: 2018-11-21 | Discharge: 2018-11-21 | Disposition: A | Discharge status: Home / Self Care | Payer: Medicaid Other | Source: Ambulatory Visit | Attending: Radiation Oncology | Admitting: Radiation Oncology

## 2018-11-21 VITALS — BP 137/96 | HR 78 | Temp 97.6°F | Resp 20 | Ht 69.0 in | Wt 180.0 lb

## 2018-11-21 DIAGNOSIS — C7951 Secondary malignant neoplasm of bone: Secondary | ICD-10-CM | POA: Diagnosis present

## 2018-11-21 DIAGNOSIS — Z923 Personal history of irradiation: Secondary | ICD-10-CM | POA: Diagnosis not present

## 2018-11-21 DIAGNOSIS — C61 Malignant neoplasm of prostate: Secondary | ICD-10-CM | POA: Insufficient documentation

## 2018-11-21 DIAGNOSIS — M546 Pain in thoracic spine: Secondary | ICD-10-CM | POA: Diagnosis not present

## 2018-11-21 DIAGNOSIS — Z79899 Other long term (current) drug therapy: Secondary | ICD-10-CM | POA: Diagnosis not present

## 2018-11-21 MED ORDER — OXYCODONE HCL ER 20 MG PO T12A: 20.0000 mg | EXTENDED_RELEASE_TABLET | Freq: Two times a day (BID) | ORAL | 0 refills | Status: DC

## 2018-11-21 MED ORDER — OXYCODONE HCL 10 MG PO TABS: 20.0000 mg | ORAL_TABLET | Freq: Every day | ORAL | 0 refills | Status: DC

## 2018-11-21 NOTE — Patient Instructions (Signed)
Coronavirus (COVID-19) Are you at risk?  Are you at risk for the Coronavirus (COVID-19)?  To be considered HIGH RISK for Coronavirus (COVID-19), you have to meet the following criteria:  . Traveled to [place], [place], [place], [place] or [place]; or in the [place] to [place], [place], [place], or [place]; and have fever, cough, and shortness of breath within the last 2 weeks of travel OR . Been in close contact with a person diagnosed with COVID-19 within the last 2 weeks and have fever, cough, and shortness of breath . IF YOU DO NOT MEET THESE CRITERIA, YOU ARE CONSIDERED LOW RISK FOR COVID-19.  What to do if you are HIGH RISK for COVID-19?  . If you are having a medical emergency, call 911. . Seek medical care right away. Before you go to a doctor's office, urgent care or emergency department, call ahead and tell them about your recent travel, contact with someone diagnosed with COVID-19, and your symptoms. You should receive instructions from your physician's office regarding next steps of care.  . When you arrive at healthcare provider, tell the healthcare staff immediately you have returned from visiting [place], [place], [place], [place] or [place]; or traveled in the [place] to [place], [place], [place], or [place]; in the last two weeks or you have been in close contact with a person diagnosed with COVID-19 in the last 2 weeks.   . Tell the health care staff about your symptoms: fever, cough and shortness of breath. . After you have been seen by a medical provider, you will be either: o Tested for (COVID-19) and discharged home on quarantine except to seek medical care if symptoms worsen, and asked to  - Stay home and avoid contact with others until you get your results (4-5 days)  - Avoid travel on public transportation if possible (such as bus, train, or airplane) or o Sent to the Emergency Department by EMS for evaluation, COVID-19 testing, and possible  admission depending on your condition and test results.  What to do if you are LOW RISK for COVID-19?  Reduce your risk of any infection by using the same precautions used for avoiding the common cold or flu:  . Wash your hands often with soap and warm water for at least 20 seconds.  If soap and water are not readily available, use an alcohol-based hand sanitizer with at least 60% alcohol.  . If coughing or sneezing, cover your mouth and nose by coughing or sneezing into the elbow areas of your shirt or coat, into a tissue or into your sleeve (not your hands). . Avoid shaking hands with others and consider head nods or verbal greetings only. . Avoid touching your eyes, nose, or mouth with unwashed hands.  . Avoid close contact with people who are sick. . Avoid places or events with large numbers of people in one location, like concerts or sporting events. . Carefully consider travel plans you have or are making. . If you are planning any travel outside or inside the [place], visit the CDC's Travelers' Health webpage for the latest health notices. . If you have some symptoms but not all symptoms, continue to monitor at home and seek medical attention if your symptoms worsen. . If you are having a medical emergency, call 911.   ADDITIONAL HEALTHCARE OPTIONS FOR PATIENTS  [facility] / e-Visit: [email or web address]         [facility]: [phone]  [facility]: [phone]                   [facility]: [phone]   

## 2018-11-21 NOTE — Progress Notes (Signed)
Radiation Oncology         [phone] ________________________________  Name: Jonathan Campbell MRN: [id number]  Date: 11/21/2018  DOB: September 02, 1960  Follow-Up Visit Note  CC: Patient, No Pcp Per  Domenic Polite, MD    ICD-10-CM   1. Prostate cancer metastatic to bone [facility]) C61 MR THORACIC SPINE W WO CONTRAST   C79.51     Diagnosis:   Prostate cancer metastatic to bone   Interval Since most recent treatment, Radiation:  3 months   Site/dose:Rightsuperior glenoid of right shoulder; 30 Gy in 10 fractions of 3 Gy  Narrative:  The patient has been follow-up.  He is been having increasing pain in the lower thoracic spine pelvis area.  Patient reports this is how he initially presented prior to his surgery and postop radiation therapy.  He denies any bowel or bladder incontinence.  He denies any new weakness in his lower extremities, numbness or paresthesias. continues on his present pain regimen which for the past 5 days is not controlling his pain as well as before.  He denies any injury to his back following recently.  He continues to wear his back brace pretty much all day except sleeping.                         ALLERGIES:  has No Known Allergies.  Meds: Current Outpatient Medications  Medication Sig Dispense Refill  . acetaminophen (TYLENOL) 325 MG tablet Take 2 tablets (650 mg total) by mouth every 6 (six) hours as needed for mild pain. 120 tablet 0  . methocarbamol (ROBAXIN) 750 MG tablet Take 1 tablet (750 mg total) by mouth 3 (three) times daily. 90 tablet 2  . oxyCODONE (OXYCONTIN) 20 mg 12 hr tablet Take 1 tablet (20 mg total) by mouth every 12 (twelve) hours. 60 tablet 0  . polyethylene glycol (MIRALAX / GLYCOLAX) packet Take 17 g by mouth daily. Use OTC Clearax 17 g po daily 30 each 2  . ZYTIGA 250 MG tablet Take 4 tablets (1,000 mg total) by mouth daily. Take on an empty stomach 1 hour before or 2 hours after a meal 120 tablet 11  . Oxycodone HCl 10 MG TABS Take 2 tablets  (20 mg total) by mouth 6 (six) times daily. 360 tablet 0  . ranitidine (ZANTAC) 300 MG tablet Take 1 tablet (300 mg total) by mouth at bedtime. 30 tablet 1   No current facility-administered medications for this encounter.     Physical Findings: The patient is in no acute distress. Patient is alert and oriented.  height is 5\' 9"  (1.753 m) and weight is 180 lb (81.6 kg). His temporal temperature is 97.6 F (36.4 C). His blood pressure is 137/96 (abnormal) and his pulse is 78. His respiration is 20 and oxygen saturation is 98%. . Lungs are clear to auscultation bilaterally. Heart has regular rate and rhythm. No palpable cervical, supraclavicular, or axillary adenopathy. Abdomen soft, non-tender, normal bowel sounds.  Back brace in place.  Palpation along the lower thoracic spine area reveals some point tenderness.  He also has some tenderness with palpation in the bilateral iliac crest regions and region of the greater trochanters.  Lab Findings: Lab Results  Component Value Date   WBC 6.2 10/27/2018   HGB 14.4 10/27/2018   HCT 42.3 10/27/2018   MCV 83.4 10/27/2018   PLT 246 10/27/2018    Radiographic Findings: No results found.  Impression:  Prostate cancer metastatic to bone.  Patient has developed worsening pain in the lower back region despite his PSA showing a good response to androgen ablation.  Plan: Patient will be scheduled for MRI of the thoracic spine with and without contrast.  Treatment plan pending results of MRI.  Pain medications refilled to start June 1  ____________________________________ Gery Pray, MD

## 2018-11-21 NOTE — Progress Notes (Signed)
Jonathan Campbell presents today with new onset pain in lower sides/back. Pt reports that pain started approximately 5 days ago. Pt reports being unable to sleep despite pain medication regimen. Pt is receptive to new imaging.   BP (!) 137/96 (BP Location: Left Arm, Patient Position: Sitting)   Pulse 78   Temp 97.6 F (36.4 C) (Temporal)   Resp 20   Ht 5\' 9"  (1.753 m)   Wt 180 lb (81.6 kg)   SpO2 98%   BMI 26.58 kg/m   Wt Readings from Last 3 Encounters:  11/21/18 180 lb (81.6 kg)  10/27/18 181 lb 4.8 oz (82.2 kg)  09/11/18 180 lb 3.2 oz (81.7 kg)   Loma Sousa, RN BSN

## 2018-11-22 MED FILL — oxyCODONE HCL 10 MG TABS: 10 | 30 days supply | Qty: 360 | Fill #0

## 2018-11-22 MED FILL — PROAIR HFA 90 MCG INHALER: 108 (90 BAS | 17 days supply | Qty: 9 | Fill #0

## 2018-11-27 MED FILL — oxyCODONE HCL ER 20 MG T12A: 20 | 30 days supply | Qty: 60 | Fill #0

## 2018-11-29 MED FILL — ZYTIGA 250 MG TABLET: 250 | 30 days supply | Qty: 120 | Fill #0

## 2018-11-29 MED FILL — NARCAN 4 MG NASAL SPRAY: 4 | 28 days supply | Qty: 1 | Fill #0

## 2018-11-29 MED FILL — BENZONATATE 100 MG CAPS: 100 | 30 days supply | Qty: 60 | Fill #0

## 2018-12-08 ENCOUNTER — Telehealth: Payer: Self-pay | Admitting: *Deleted

## 2018-12-08 NOTE — Telephone Encounter (Signed)
Called patient to inform of MRI for 12-15-18 - arrival time - 3:45 pm @ [facility], no restrictions to test, spoke with patient and he Is aware of this test

## 2018-12-11 ENCOUNTER — Telehealth: Payer: Self-pay | Admitting: *Deleted

## 2018-12-11 NOTE — Telephone Encounter (Signed)
CALLED PATIENT TO ALTER FU APPT. ON 12-14-18, FU APPT. MOVED TO 12-18-18 @ 11 AM , NO ANSWER, UNABLE TO LEAVE MESSAGE, WILL CALL LATER

## 2018-12-11 NOTE — Telephone Encounter (Signed)
Oral Oncology Pharmacist Encounter  I confirmed with the [facility] that patient picked up his next fill of Zytiga on 12/05/2018.  Johny Drilling, PharmD, BCPS, BCOP  12/11/2018 9:47 AM Oral Oncology Clinic [phone]

## 2018-12-12 ENCOUNTER — Telehealth: Payer: Self-pay | Admitting: *Deleted

## 2018-12-12 NOTE — Telephone Encounter (Signed)
CALLED PATIENT TO INFORM THAT FU APPT. HAS BEEN MOVED FROM 12-14-18 TO 12-18-18 @ 11 AM WITH DR. KINARD, SPOKE WITH PATIENT AND HE IS AWARE OF THIS CHANGE AND HE IS GOOD WITH IT.

## 2018-12-14 ENCOUNTER — Ambulatory Visit: Payer: Medicaid Other | Admitting: Radiation Oncology

## 2018-12-15 ENCOUNTER — Other Ambulatory Visit: Payer: Self-pay | Admitting: Radiation Oncology

## 2018-12-15 ENCOUNTER — Other Ambulatory Visit: Payer: Self-pay

## 2018-12-15 ENCOUNTER — Ambulatory Visit (HOSPITAL_COMMUNITY)
Admission: RE | Admit: 2018-12-15 | Discharge: 2018-12-15 | Disposition: A | Discharge status: Home / Self Care | Payer: Medicaid Other | Source: Ambulatory Visit | Attending: Radiation Oncology | Admitting: Radiation Oncology

## 2018-12-15 DIAGNOSIS — C7951 Secondary malignant neoplasm of bone: Secondary | ICD-10-CM

## 2018-12-15 DIAGNOSIS — C61 Malignant neoplasm of prostate: Secondary | ICD-10-CM | POA: Insufficient documentation

## 2018-12-15 MED ORDER — GADOBUTROL 1 MMOL/ML IV SOLN: 8.0000 mL | Freq: Once | INTRAVENOUS | Status: DC | PRN

## 2018-12-15 NOTE — Progress Notes (Signed)
PT refused contrast administration. Pt stated scanner was too loud and he wanted out. Unable to run post contrast series.

## 2018-12-18 ENCOUNTER — Encounter: Payer: Self-pay | Admitting: Radiation Oncology

## 2018-12-18 ENCOUNTER — Other Ambulatory Visit: Payer: Self-pay

## 2018-12-18 ENCOUNTER — Ambulatory Visit
Admission: RE | Admit: 2018-12-18 | Discharge: 2018-12-18 | Disposition: A | Discharge status: Home / Self Care | Payer: Medicaid Other | Source: Ambulatory Visit | Attending: Radiation Oncology | Admitting: Radiation Oncology

## 2018-12-18 VITALS — BP 142/91 | HR 78 | Temp 97.1°F | Resp 18 | Ht 69.0 in | Wt 190.0 lb

## 2018-12-18 DIAGNOSIS — K7689 Other specified diseases of liver: Secondary | ICD-10-CM | POA: Insufficient documentation

## 2018-12-18 DIAGNOSIS — Z923 Personal history of irradiation: Secondary | ICD-10-CM | POA: Insufficient documentation

## 2018-12-18 DIAGNOSIS — C7989 Secondary malignant neoplasm of other specified sites: Secondary | ICD-10-CM

## 2018-12-18 DIAGNOSIS — C7951 Secondary malignant neoplasm of bone: Secondary | ICD-10-CM | POA: Insufficient documentation

## 2018-12-18 DIAGNOSIS — M25519 Pain in unspecified shoulder: Secondary | ICD-10-CM | POA: Diagnosis not present

## 2018-12-18 DIAGNOSIS — C801 Malignant (primary) neoplasm, unspecified: Secondary | ICD-10-CM

## 2018-12-18 DIAGNOSIS — C61 Malignant neoplasm of prostate: Secondary | ICD-10-CM | POA: Diagnosis not present

## 2018-12-18 DIAGNOSIS — Z79899 Other long term (current) drug therapy: Secondary | ICD-10-CM | POA: Diagnosis not present

## 2018-12-18 MED ORDER — OXYCODONE HCL 10 MG PO TABS: 20.0000 mg | ORAL_TABLET | Freq: Every day | ORAL | 0 refills | Status: DC

## 2018-12-18 MED FILL — oxyCODONE HCL 10 MG TABS: 10 | 30 days supply | Qty: 360 | Fill #0

## 2018-12-18 MED FILL — NARCAN 4 MG NASAL SPRAY: 4 | 28 days supply | Qty: 1 | Fill #0

## 2018-12-18 NOTE — Progress Notes (Signed)
Radiation Oncology         [phone] ________________________________  Name: Jonathan Campbell MRN: [id number]  Date: 12/18/2018  DOB: [birth year]  Follow-Up Visit Note  CC: Patient, No Pcp Per  Domenic Polite, MD    ICD-10-CM   1. 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 Prostate cancer metastatic to bone [facility])  C61    C79.51     Diagnosis:   Prostate cancer metastatic to bone   Interval Since most recent treatment, Radiation:  4 months   Site/dose:Rightsuperior glenoid of right shoulder; 30 Gy in 10 fractions of 3 Gy  Narrative:  The patient is here for follow-up after being seen in May and experiencing increasing pain in the lower thoracic spine pelvis area at that time. MRI was reviewed which showed no new lesions, but he continues to have pain in his shoulders and back.  Since he was last seen, he underwent MRI without contrast on 12/15/18 (unable to tolerate IV contrast). The scan was not complete as image quality was degraded by motion. It showed stable post radiation changes T10 through T12. Stable pathologic fracture at T12. 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 No new areas of tumor.  Patient reported he is having 7/10 constant pain to back, hips, and bilateral lower extremities. He states he has run out of pain medications.              ALLERGIES:  has No Known Allergies.  Meds: Current Outpatient Medications  Medication Sig Dispense Refill   oxyCODONE (OXYCONTIN) 20 mg 12 hr tablet Take 1 tablet (20 mg total) by mouth every 12 (twelve) hours. 60 tablet 0   Oxycodone HCl 10 MG TABS Take 2 tablets (20 mg total) by mouth 6 (six) times daily. 360 tablet 0   ranitidine (ZANTAC) 300 MG tablet Take 1 tablet (300 mg total) by mouth at bedtime. 30 tablet 1   ZYTIGA 250 MG tablet Take 4 tablets (1,000 mg total) by mouth daily. Take on an empty stomach  1 hour before or 2 hours after a meal 120 tablet 11   acetaminophen (TYLENOL) 325 MG tablet Take 2 tablets (650 mg total) by mouth every 6 (six) hours as needed for mild pain. (Patient not taking: Reported on 12/18/2018) 120 tablet 0   polyethylene glycol (MIRALAX / GLYCOLAX) packet Take 17 g by mouth daily. Use OTC Clearax 17 g po daily (Patient not taking: Reported on 12/18/2018) 30 each 2   No current facility-administered medications for this encounter.     Physical Findings: The patient is in no acute distress. Patient is alert and oriented.  height is 5\' 9"  (1.753 m) and weight is 190 lb (86.2 kg). His temporal temperature is 97.1 F (36.2 C) (abnormal). His blood pressure is 142/91 (abnormal) and his pulse is 78. His respiration is 18 and oxygen saturation is 99%. . Lungs are clear to auscultation bilaterally. Heart has regular rate and rhythm. No palpable cervical, supraclavicular, or axillary adenopathy. Abdomen soft, non-tender, normal bowel sounds.  Neurological exam was nonfocal. Good strength in his lower extremities.   Lab Findings: Lab Results  Component Value Date   WBC 6.2 10/27/2018   HGB 14.4 10/27/2018   HCT 42.3 10/27/2018   MCV 83.4 10/27/2018   PLT 246 10/27/2018    Radiographic Findings: Mr Thoracic Spine Wo Contrast  Result Date: 12/16/2018 CLINICAL  DATA:  Metastatic prostate cancer. EXAM: MRI THORACIC SPINE WITHOUT CONTRAST TECHNIQUE: Multiplanar, multisequence MR imaging of the thoracic spine was performed. No intravenous contrast was administered. COMPARISON:  MRI thoracic spine 02/14/2018 FINDINGS: Alignment:  Normal alignment The patient was not able to complete the study. Intravenous contrast was not given. Obtained images demonstrate mild to moderate motion. Vertebrae: Radiation changes with fatty bone marrow T11, T12, L1. Pathologic fracture T12 stable. Ablation of tumor T12 vertebral body with kyphoplasty unchanged. Epidural cement is seen bilaterally left  greater than right, stable from the prior study. This is causing mild spinal stenosis and cord deformity, unchanged. No new area of tumor or fracture in the thoracic spine. Small enhancing lesions in vertebral bodies were identified on prior contrast enhanced MRI of 09/13/2017 are not appreciated on today's study. Cord:  Normal cord signal.  Limited cord evaluation due to motion. Paraspinal and other soft tissues: Numerous hepatic cysts. No paraspinous adenopathy or pleural effusion. Disc levels: Mild facet degeneration and disc degeneration throughout the thoracic spine. No focal disc protrusion. IMPRESSION: The patient was not able to complete the study. Image quality degraded by motion. The patient could not tolerate intravenous contrast imaging. Stable post radiation changes T10 through T12. Stable pathologic fracture at T12. 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 No new areas of tumor. Electronically Signed   By: Franchot Gallo M.D.   On: 12/16/2018 08:08    Impression:  Clinically stable. Pt continues on Zytiga. He is heading out of town for a funeral and will run out of his oxycodone prior to his return so I have refilled this a few days early. He does have enough OxyContin to last.   Plan: Routine follow-up in 6 months.   -----------------------------------  Blair Promise, PhD, MD This document serves as a record of services personally performed by Gery Pray, MD. It was created on his behalf by Mary-Margaret Loma Messing, a trained medical scribe. The creation of this record is based on the scribe's personal observations and the provider's statements to them. This document has been checked and approved by the attending provider.

## 2018-12-18 NOTE — Progress Notes (Signed)
Patient in for follow up states he is having pain to back, hip and both legs 7/10 constant. States he has run out of pain medications, usually prescribed by Dr Sondra Come.BP (!) 142/91 (BP Location: Left Arm)   Pulse 78   Temp (!) 97.1 F (36.2 C) (Temporal)   Resp 18   Ht 5\' 9"  (1.753 m)   Wt 190 lb (86.2 kg)   SpO2 99%   BMI 28.06 kg/m  Here for MRI results

## 2018-12-18 NOTE — Patient Instructions (Signed)
Coronavirus (COVID-19) Are you at risk?  Are you at risk for the Coronavirus (COVID-19)?  To be considered HIGH RISK for Coronavirus (COVID-19), you have to meet the following criteria:  . Traveled to [place], [place], [place], [place] or [place]; or in the [place] to [place], [place], [place], or [place]; and have fever, cough, and shortness of breath within the last 2 weeks of travel OR . Been in close contact with a person diagnosed with COVID-19 within the last 2 weeks and have fever, cough, and shortness of breath . IF YOU DO NOT MEET THESE CRITERIA, YOU ARE CONSIDERED LOW RISK FOR COVID-19.  What to do if you are HIGH RISK for COVID-19?  . If you are having a medical emergency, call 911. . Seek medical care right away. Before you go to a doctor's office, urgent care or emergency department, call ahead and tell them about your recent travel, contact with someone diagnosed with COVID-19, and your symptoms. You should receive instructions from your physician's office regarding next steps of care.  . When you arrive at healthcare provider, tell the healthcare staff immediately you have returned from visiting [place], [place], [place], [place] or [place]; or traveled in the [place] to [place], [place], [place], or [place]; in the last two weeks or you have been in close contact with a person diagnosed with COVID-19 in the last 2 weeks.   . Tell the health care staff about your symptoms: fever, cough and shortness of breath. . After you have been seen by a medical provider, you will be either: o Tested for (COVID-19) and discharged home on quarantine except to seek medical care if symptoms worsen, and asked to  - Stay home and avoid contact with others until you get your results (4-5 days)  - Avoid travel on public transportation if possible (such as bus, train, or airplane) or o Sent to the Emergency Department by EMS for evaluation, COVID-19 testing, and possible  admission depending on your condition and test results.  What to do if you are LOW RISK for COVID-19?  Reduce your risk of any infection by using the same precautions used for avoiding the common cold or flu:  . Wash your hands often with soap and warm water for at least 20 seconds.  If soap and water are not readily available, use an alcohol-based hand sanitizer with at least 60% alcohol.  . If coughing or sneezing, cover your mouth and nose by coughing or sneezing into the elbow areas of your shirt or coat, into a tissue or into your sleeve (not your hands). . Avoid shaking hands with others and consider head nods or verbal greetings only. . Avoid touching your eyes, nose, or mouth with unwashed hands.  . Avoid close contact with people who are sick. . Avoid places or events with large numbers of people in one location, like concerts or sporting events. . Carefully consider travel plans you have or are making. . If you are planning any travel outside or inside the [place], visit the CDC's Travelers' Health webpage for the latest health notices. . If you have some symptoms but not all symptoms, continue to monitor at home and seek medical attention if your symptoms worsen. . If you are having a medical emergency, call 911.   ADDITIONAL HEALTHCARE OPTIONS FOR PATIENTS  [facility] / e-Visit: [email or web address]         [facility]: [phone]  [facility]: [phone]                   [facility]: [phone]   

## 2018-12-26 ENCOUNTER — Other Ambulatory Visit: Payer: Self-pay | Admitting: Radiation Oncology

## 2018-12-26 MED ORDER — OXYCODONE HCL ER 20 MG PO T12A: 20.0000 mg | EXTENDED_RELEASE_TABLET | Freq: Two times a day (BID) | ORAL | 0 refills | Status: DC

## 2018-12-26 MED FILL — OxyCONTIN 20 MG T12A: 20 | 30 days supply | Qty: 60 | Fill #0

## 2019-01-11 MED FILL — ZYTIGA 250 MG TABLET: 250 | 30 days supply | Qty: 120 | Fill #1

## 2019-01-16 ENCOUNTER — Other Ambulatory Visit: Payer: Self-pay | Admitting: Radiation Oncology

## 2019-01-16 MED ORDER — OXYCODONE HCL 10 MG PO TABS: 20.0000 mg | ORAL_TABLET | Freq: Every day | ORAL | 0 refills | Status: DC

## 2019-01-16 MED FILL — oxyCODONE HCL 10 MG TABS: 10 | 30 days supply | Qty: 360 | Fill #0

## 2019-01-23 ENCOUNTER — Other Ambulatory Visit: Payer: Self-pay | Admitting: Radiation Oncology

## 2019-01-23 MED ORDER — OXYCODONE HCL ER 20 MG PO T12A: 20.0000 mg | EXTENDED_RELEASE_TABLET | Freq: Two times a day (BID) | ORAL | 0 refills | Status: DC

## 2019-01-24 MED FILL — OxyCONTIN 20 MG T12A: 20 | 30 days supply | Qty: 60 | Fill #0

## 2019-01-24 MED FILL — PROAIR HFA 90 MCG INHALER: 108 (90 BAS | 17 days supply | Qty: 9 | Fill #1

## 2019-01-26 ENCOUNTER — Other Ambulatory Visit: Payer: Self-pay | Admitting: Oncology

## 2019-01-26 DIAGNOSIS — C61 Malignant neoplasm of prostate: Secondary | ICD-10-CM

## 2019-02-07 MED FILL — ZYTIGA 250 MG TABLET: 250 | 30 days supply | Qty: 120 | Fill #2

## 2019-02-13 ENCOUNTER — Other Ambulatory Visit: Payer: Self-pay | Admitting: Radiation Oncology

## 2019-02-13 ENCOUNTER — Telehealth: Payer: Self-pay | Admitting: *Deleted

## 2019-02-13 ENCOUNTER — Telehealth: Payer: Self-pay

## 2019-02-13 MED ORDER — OXYCODONE HCL 10 MG PO TABS: 20.0000 mg | ORAL_TABLET | Freq: Every day | ORAL | 0 refills | Status: DC

## 2019-02-13 MED FILL — oxyCODONE HCL 10 MG TABS: 10 | 30 days supply | Qty: 360 | Fill #0

## 2019-02-13 NOTE — Telephone Encounter (Signed)
Received VM from pt's SO re: pain med refill and when next appt with Dr. Sondra Come is scheduled. Contacted pt to convey pain med had been sent this am. Pt requesting to f/u with Dr. Sondra Come in 3 month intervals instead of 6. Conveyed to pt that Enid Derry, Art therapist, would contact him with f/u appt date/time in late Sept. Pt verbalized understanding and agreement. Loma Sousa, RN BSN

## 2019-02-13 NOTE — Telephone Encounter (Signed)
CALLED PATIENT TO INFORM OF FU ON 03-26-19 @ 3 PM WITH DR. KINARD, SPOKE WITH PATIENT AND HE IS AWARE OF THIS APPT.

## 2019-02-14 ENCOUNTER — Telehealth: Payer: Self-pay

## 2019-02-14 NOTE — Telephone Encounter (Signed)
Scheduling message sent. Patient aware to expect a call from scheduling .

## 2019-02-14 NOTE — Telephone Encounter (Signed)
-----   Message from Jonathan Portela, MD sent at 02/14/2019 12:13 PM EDT ----- Lab + MD + Injection in 02/2019. Thanks.  ----- Message ----- From: Tami Lin, RN Sent: 02/14/2019  12:05 PM EDT To: Jonathan Portela, MD  Patient called because he does not have any follow up appointments. He was last seen 10/27/2018 and wants to know when his next Lupron shot should be scheduled. Lupron was last given 09/2018 and in your note f/u with you would be 02/2019 and Lupron every 4 months. Do you want me to send a message to add patient for a Lupron shot and visit with you? Lanelle Bal

## 2019-02-15 ENCOUNTER — Telehealth: Payer: Self-pay | Admitting: Oncology

## 2019-02-15 NOTE — Telephone Encounter (Signed)
Scheduled appt per 8/19 sch message - pt is aware of appt date and time

## 2019-02-26 ENCOUNTER — Telehealth: Payer: Self-pay

## 2019-02-26 ENCOUNTER — Other Ambulatory Visit: Payer: Self-pay | Admitting: Radiation Oncology

## 2019-02-26 MED ORDER — OXYCODONE HCL ER 20 MG PO T12A: 20.0000 mg | EXTENDED_RELEASE_TABLET | Freq: Two times a day (BID) | ORAL | 0 refills | Status: DC

## 2019-02-26 MED FILL — OxyCONTIN 20 MG T12A: 20 | 30 days supply | Qty: 60 | Fill #0

## 2019-02-26 NOTE — Telephone Encounter (Signed)
Received VM at 1045 from pt demanding refill be sent immediately for extended release oxycontin. Pt went on to state that he had his "fiance" call Friday to request this medication as refill as he is now out. Returned call to pt to report that prescription had been sent at 0846 this am and that Dr. Sondra Come had been out of the office Friday as per his standard schedule. Loma Sousa, RN BSN

## 2019-03-06 MED FILL — ZYTIGA 250 MG TABLET: 250 | 30 days supply | Qty: 120 | Fill #3

## 2019-03-13 ENCOUNTER — Other Ambulatory Visit: Payer: Self-pay | Admitting: Radiation Oncology

## 2019-03-13 MED ORDER — OXYCODONE HCL 10 MG PO TABS: 20.0000 mg | ORAL_TABLET | Freq: Every day | ORAL | 0 refills | Status: DC

## 2019-03-14 ENCOUNTER — Telehealth: Payer: Self-pay

## 2019-03-14 MED FILL — oxyCODONE HCL 10 MG TABS: 10 | 30 days supply | Qty: 360 | Fill #0

## 2019-03-14 NOTE — Telephone Encounter (Addendum)
Oral Oncology Patient Advocate Encounter  Chunky notified me that the patient has been receiving his Zytiga from [place] and [place] and has more medicine than he needs right now.   I called the patient to talk to him about this. He stated that his [facility] enrollment expired in May 2020 but he has been receiving Zytiga from them anyway. I told him I would call [facility] and stop the fills from [place] and [place], he agreed. His next refill call with [facility] is 03/30/19 and he is ok with that.  I called [facility] and they told me that when COVID hit they extended the enrollment 3 months. His last fill of Zytiga with [facility] was on 02/26/19. They will not send anymore Zytiga to the patient.  The patient verbalized understanding and great appreciation.  [facility] [place] Phone [phone] Fax [phone] 03/14/2019   1:55 PM

## 2019-03-20 ENCOUNTER — Inpatient Hospital Stay: Payer: Medicaid Other | Attending: Oncology

## 2019-03-20 ENCOUNTER — Inpatient Hospital Stay (HOSPITAL_BASED_OUTPATIENT_CLINIC_OR_DEPARTMENT_OTHER): Payer: Medicaid Other | Admitting: Oncology

## 2019-03-20 ENCOUNTER — Inpatient Hospital Stay: Payer: Medicaid Other

## 2019-03-20 ENCOUNTER — Other Ambulatory Visit: Payer: Self-pay

## 2019-03-20 VITALS — BP 125/93 | HR 78 | Temp 98.2°F | Resp 18 | Ht 69.0 in | Wt 193.4 lb

## 2019-03-20 DIAGNOSIS — C7951 Secondary malignant neoplasm of bone: Secondary | ICD-10-CM | POA: Diagnosis not present

## 2019-03-20 DIAGNOSIS — Z79818 Long term (current) use of other agents affecting estrogen receptors and estrogen levels: Secondary | ICD-10-CM | POA: Diagnosis not present

## 2019-03-20 DIAGNOSIS — C61 Malignant neoplasm of prostate: Secondary | ICD-10-CM

## 2019-03-20 DIAGNOSIS — Z79899 Other long term (current) drug therapy: Secondary | ICD-10-CM | POA: Diagnosis not present

## 2019-03-20 DIAGNOSIS — Z923 Personal history of irradiation: Secondary | ICD-10-CM | POA: Insufficient documentation

## 2019-03-20 LAB — CBC WITH DIFFERENTIAL (CANCER CENTER ONLY)
Abs Immature Granulocytes: 0.09 10*3/uL — ABNORMAL HIGH (ref 0.00–0.07)
Basophils Absolute: 0.1 10*3/uL (ref 0.0–0.1)
Basophils Relative: 1 %
Eosinophils Absolute: 0.5 10*3/uL (ref 0.0–0.5)
Eosinophils Relative: 6 %
HCT: 41.4 % (ref 39.0–52.0)
Hemoglobin: 14.1 g/dL (ref 13.0–17.0)
Immature Granulocytes: 1 %
Lymphocytes Relative: 26 %
Lymphs Abs: 1.9 10*3/uL (ref 0.7–4.0)
MCH: 28.7 pg (ref 26.0–34.0)
MCHC: 34.1 g/dL (ref 30.0–36.0)
MCV: 84.3 fL (ref 80.0–100.0)
Monocytes Absolute: 0.5 10*3/uL (ref 0.1–1.0)
Monocytes Relative: 6 %
Neutro Abs: 4.5 10*3/uL (ref 1.7–7.7)
Neutrophils Relative %: 60 %
Platelet Count: 273 10*3/uL (ref 150–400)
RBC: 4.91 MIL/uL (ref 4.22–5.81)
RDW: 13.7 % (ref 11.5–15.5)
WBC Count: 7.6 10*3/uL (ref 4.0–10.5)
nRBC: 0 % (ref 0.0–0.2)

## 2019-03-20 LAB — CMP (CANCER CENTER ONLY)
ALT: 18 U/L (ref 0–44)
AST: 23 U/L (ref 15–41)
Albumin: 3.6 g/dL (ref 3.5–5.0)
Alkaline Phosphatase: 198 U/L — ABNORMAL HIGH (ref 38–126)
Anion gap: 9 (ref 5–15)
BUN: 14 mg/dL (ref 6–20)
CO2: 24 mmol/L (ref 22–32)
Calcium: 9.5 mg/dL (ref 8.9–10.3)
Chloride: 109 mmol/L (ref 98–111)
Creatinine: 1.39 mg/dL — ABNORMAL HIGH (ref 0.61–1.24)
GFR, Est AFR Am: >60 mL/min (ref >60)
GFR, Estimated: 55 mL/min — ABNORMAL LOW (ref >60)
Glucose, Bld: 125 mg/dL — ABNORMAL HIGH (ref 70–99)
Potassium: 4.3 mmol/L (ref 3.5–5.1)
Sodium: 142 mmol/L (ref 135–145)
Total Bilirubin: 0.6 mg/dL (ref 0.3–1.2)
Total Protein: 8 g/dL (ref 6.5–8.1)

## 2019-03-20 MED ORDER — LEUPROLIDE ACETATE (4 MONTH) 30 MG ~~LOC~~ KIT
30.0000 mg | PACK | Freq: Once | SUBCUTANEOUS | Status: AC
  Administered 2019-03-20: 30 mg via SUBCUTANEOUS
  Filled 2019-03-20: qty 30

## 2019-03-20 NOTE — Progress Notes (Signed)
Hematology and Oncology Follow Up Visit  Jonathan Campbell [id number] 14-Dec-1960 58 y.o. 03/20/2019 9:54 AM Patient, No Pcp PerNo ref. 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  He presented with a PSA of 819 and advanced disease at the time of presentation.   Prior Therapy:  Radiation therapy between September 16, 2017 and September 29, 2017 to complete 230 Gy in 10 fractions to thoracic spine.   He completed 4 weeks of Casodex between April and May 2019.  Current therapy:  Lupron 30 mg given starting on 10/05/2017.  This will be repeated every 4 months.  Zytiga 1000 mg daily started in May 2019.  Interim History: Jonathan Campbell is here for a repeat evaluation.  Since the last visit, he reports no major changes at this time.  He continues to tolerate Zytiga without any major complaints.  He denies nausea, vomiting or GI complaints.  He is eating well and has gained weight.  Denies any worsening edema or fatigue.  Denies any worsening pain or pathological fractures.  He does report chronic discomfort related to his previous thoracic spine involvement but currently manageable.  Patient denied headaches, blurry vision, syncope or seizures.  Denies any fevers, chills or sweats.  Denied chest pain, palpitation, orthopnea or leg edema.  Denied cough, wheezing or hemoptysis.  Denied nausea, vomiting or abdominal pain.  Denies any constipation or diarrhea.  Denies any frequency urgency or hesitancy.  Denies any arthralgias or myalgias.  Denies any skin rashes or lesions.  Denies any bleeding or clotting tendency.  Denies any easy bruising.  Denies any hair or nail changes.  Denies any anxiety or depression.  Remaining review of system is negative.       Medications: Updated on review. Current Outpatient Medications  Medication Sig Dispense Refill  . acetaminophen (TYLENOL) 325  MG tablet Take 2 tablets (650 mg total) by mouth every 6 (six) hours as needed for mild pain. (Patient not taking: Reported on 12/18/2018) 120 tablet 0  . oxyCODONE (OXYCONTIN) 20 mg 12 hr tablet Take 1 tablet (20 mg total) by mouth every 12 (twelve) hours. 60 tablet 0  . Oxycodone HCl 10 MG TABS Take 2 tablets (20 mg total) by mouth 6 (six) times daily. 360 tablet 0  . polyethylene glycol (MIRALAX / GLYCOLAX) packet Take 17 g by mouth daily. Use OTC Clearax 17 g po daily (Patient not taking: Reported on 12/18/2018) 30 each 2  . ranitidine (ZANTAC) 300 MG tablet Take 1 tablet (300 mg total) by mouth at bedtime. 30 tablet 1  . ZYTIGA 250 MG tablet TAKE 4 TABLETS BY MOUTH ONCE DAILY AS DIRECTED.  TAKE 1 HOUR BEFORE OR 2 HOURS AFTER A MEAL 120 tablet 11   No current facility-administered medications for this visit.      Allergies: No Known Allergies  Past Medical History, Surgical history, Social history, and Family History unchanged today on review.    Physical Exam:  Blood pressure (!) 125/93, pulse 78, temperature 98.2 F (36.8 C), temperature source Temporal, resp. rate 18, height 5\' 9"  (1.753 m), weight 193 lb 6.4 oz (87.7 kg), SpO2 100 %.     ECOG: 1   General appearance: Alert, awake without any distress. Head: Atraumatic without abnormalities Oropharynx: Without any thrush or ulcers. Eyes: No scleral icterus. Lymph nodes: No lymphadenopathy noted in the cervical, supraclavicular, or axillary nodes Heart:regular rate and rhythm, without any  murmurs or gallops.   Lung: Clear to auscultation without any rhonchi, wheezes or dullness to percussion. Abdomin: Soft, nontender without any shifting dullness or ascites. Musculoskeletal: No clubbing or cyanosis. Neurological: No motor or sensory deficits. Skin: No rashes or lesions.         Lab Results: Lab Results  Component Value Date   WBC 7.6 03/20/2019   HGB 14.1 03/20/2019   HCT 41.4 03/20/2019   MCV 84.3 03/20/2019    PLT 273 03/20/2019     Chemistry      Component Value Date/Time   NA 143 10/27/2018 1236   K 3.6 10/27/2018 1236   CL 110 10/27/2018 1236   CO2 24 10/27/2018 1236   BUN 19 10/27/2018 1236   CREATININE 1.23 10/27/2018 1236      Component Value Date/Time   CALCIUM 9.4 10/27/2018 1236   ALKPHOS 216 (H) 10/27/2018 1236   AST 27 10/27/2018 1236   ALT 26 10/27/2018 1236   BILITOT 0.4 10/27/2018 1236      Results for Jonathan Campbell (MRN [id number]) as of 03/20/2019 09:47  Ref. Range 06/06/2018 11:20 10/27/2018 12:36  Prostate Specific Ag, Serum Latest Ref Range: 0.0 - 4.0 ng/mL 8.5 (H) 6.8 (H)       Impression and Plan:  58 year old man with  1.  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  He continues to have excellent a PSA response on Zytiga.  Risks and benefits of continuing this therapy was reviewed today.  Alternative options including systemic chemotherapy, Xtandi as well as Xofigo were reiterated.  Long-term complication associated with this medication was also reviewed including hypertension and adrenal insufficiency.  After discussion today he is agreeable to continue experienced  2.  Spinal metastasis: No recent exacerbation at this time.  Status post radiation therapy.   3.  Bone directed therapy: I recommended continuing calcium and vitamin D supplements and Delton See has been deferred till he obtains dental clearance.  4.  Androgen deprivation: He continues to be on long-term androgen deprivation therapy.  Complications associated with this therapy including weight gain, osteoporosis and hot flashes.  He is agreeable to continue at this time.  5.  Prognosis and goals of care: Therapy remains palliative although aggressive measures are warranted given his excellent performance status.  6.  Follow-up: He will return in 4 months for repeat evaluation.  25  minutes was spent with the patient  face-to-face today.  More than 50% of time was dedicated to reviewing treatment options, complications of therapy and answering questions regarding future plan of care.   Zola Button, MD 9/22/20209:54 AM

## 2019-03-20 NOTE — Patient Instructions (Signed)

## 2019-03-21 ENCOUNTER — Telehealth: Payer: Self-pay

## 2019-03-21 LAB — PROSTATE-SPECIFIC AG, SERUM (LABCORP): Prostate Specific Ag, Serum: 7.6 ng/mL — ABNORMAL HIGH (ref 0.0–4.0)

## 2019-03-21 NOTE — Telephone Encounter (Signed)
-----   Message from Wyatt Portela, MD sent at 03/21/2019  8:04 AM EDT ----- Please let him know his PSA is little up but still good without changes needed.

## 2019-03-21 NOTE — Telephone Encounter (Signed)
Called patient with information listed below. Patient verbalized understanding.

## 2019-03-23 ENCOUNTER — Telehealth: Payer: Self-pay | Admitting: Oncology

## 2019-03-23 NOTE — Telephone Encounter (Signed)
Called and left msg. Mailed printout  °

## 2019-03-26 ENCOUNTER — Encounter: Payer: Self-pay | Admitting: Radiation Oncology

## 2019-03-26 ENCOUNTER — Other Ambulatory Visit: Payer: Self-pay

## 2019-03-26 ENCOUNTER — Ambulatory Visit
Admission: RE | Admit: 2019-03-26 | Discharge: 2019-03-26 | Disposition: A | Discharge status: Home / Self Care | Payer: Medicaid Other | Source: Ambulatory Visit | Attending: Radiation Oncology | Admitting: Radiation Oncology

## 2019-03-26 VITALS — BP 108/82 | HR 104 | Temp 98.9°F | Resp 18 | Ht 69.0 in | Wt 192.0 lb

## 2019-03-26 DIAGNOSIS — C7951 Secondary malignant neoplasm of bone: Secondary | ICD-10-CM | POA: Insufficient documentation

## 2019-03-26 DIAGNOSIS — C61 Malignant neoplasm of prostate: Secondary | ICD-10-CM

## 2019-03-26 DIAGNOSIS — M549 Dorsalgia, unspecified: Secondary | ICD-10-CM | POA: Diagnosis not present

## 2019-03-26 DIAGNOSIS — Z923 Personal history of irradiation: Secondary | ICD-10-CM | POA: Diagnosis not present

## 2019-03-26 DIAGNOSIS — Z79899 Other long term (current) drug therapy: Secondary | ICD-10-CM | POA: Diagnosis not present

## 2019-03-26 MED ORDER — OXYCODONE HCL ER 20 MG PO T12A: 20.0000 mg | EXTENDED_RELEASE_TABLET | Freq: Two times a day (BID) | ORAL | 0 refills | Status: DC

## 2019-03-26 MED FILL — OxyCONTIN 20 MG T12A: 20 | 30 days supply | Qty: 60 | Fill #0

## 2019-03-26 NOTE — Patient Instructions (Signed)
Coronavirus (COVID-19) Are you at risk?  Are you at risk for the Coronavirus (COVID-19)?  To be considered HIGH RISK for Coronavirus (COVID-19), you have to meet the following criteria:  . Traveled to [place], [place], [place], [place] or [place]; or in the [place] to [place], [place], [place], or [place]; and have fever, cough, and shortness of breath within the last 2 weeks of travel OR . Been in close contact with a person diagnosed with COVID-19 within the last 2 weeks and have fever, cough, and shortness of breath . IF YOU DO NOT MEET THESE CRITERIA, YOU ARE CONSIDERED LOW RISK FOR COVID-19.  What to do if you are HIGH RISK for COVID-19?  . If you are having a medical emergency, call 911. . Seek medical care right away. Before you go to a doctor's office, urgent care or emergency department, call ahead and tell them about your recent travel, contact with someone diagnosed with COVID-19, and your symptoms. You should receive instructions from your physician's office regarding next steps of care.  . When you arrive at healthcare provider, tell the healthcare staff immediately you have returned from visiting [place], [place], [place], [place] or [place]; or traveled in the [place] to [place], [place], [place], or [place]; in the last two weeks or you have been in close contact with a person diagnosed with COVID-19 in the last 2 weeks.   . Tell the health care staff about your symptoms: fever, cough and shortness of breath. . After you have been seen by a medical provider, you will be either: o Tested for (COVID-19) and discharged home on quarantine except to seek medical care if symptoms worsen, and asked to  - Stay home and avoid contact with others until you get your results (4-5 days)  - Avoid travel on public transportation if possible (such as bus, train, or airplane) or o Sent to the Emergency Department by EMS for evaluation, COVID-19 testing, and possible  admission depending on your condition and test results.  What to do if you are LOW RISK for COVID-19?  Reduce your risk of any infection by using the same precautions used for avoiding the common cold or flu:  . Wash your hands often with soap and warm water for at least 20 seconds.  If soap and water are not readily available, use an alcohol-based hand sanitizer with at least 60% alcohol.  . If coughing or sneezing, cover your mouth and nose by coughing or sneezing into the elbow areas of your shirt or coat, into a tissue or into your sleeve (not your hands). . Avoid shaking hands with others and consider head nods or verbal greetings only. . Avoid touching your eyes, nose, or mouth with unwashed hands.  . Avoid close contact with people who are sick. . Avoid places or events with large numbers of people in one location, like concerts or sporting events. . Carefully consider travel plans you have or are making. . If you are planning any travel outside or inside the [place], visit the CDC's Travelers' Health webpage for the latest health notices. . If you have some symptoms but not all symptoms, continue to monitor at home and seek medical attention if your symptoms worsen. . If you are having a medical emergency, call 911.   ADDITIONAL HEALTHCARE OPTIONS FOR PATIENTS  [facility] / e-Visit: [email or web address]         [facility]: [phone]  [facility]: [phone]                   [facility]: [phone]   

## 2019-03-26 NOTE — Progress Notes (Signed)
Radiation Oncology         [phone] ________________________________  Name: Jonathan Campbell MRN: [id number]  Date: 03/26/2019  DOB: [birth year]  Follow-Up Visit Note  CC: Patient, No Pcp Per  Domenic Polite, MD    ICD-10-CM   1. Prostate cancer metastatic to bone [facility])  C61    C79.51     Diagnosis:   Prostate cancer metastatic to bone   Interval Since Last Radiation:  7 months and 2 weeks 07/26/2018 - 08/08/2018: Rightsuperior glenoid of right shoulder / 30 Gy in 10 fractions of 3 Gy  12/01/17 - 12/14/17: 1. Left Scapula / 30 Gy in 10 fractions 2. Lumbar Spine (L4 region) / 30 Gy in 10 fractions  09/16/17 - 09/29/17: T Spine / 30 Gy in 10 fractions  Narrative:  The patient is here for routine follow-up. He requested to be seen every 3 months, instead of every 6. He continues on Lupron every 4 months and Zytiga daily under Dr. Alen Blew, whom he last saw on 03/20/2019. PSA performed at that time was 7.6.  On review of systems, he reports continued back pain, described as aching and rated 6/10, which is worsened by the weather/air pressure. He notes he is settling in to a good routine with long- and short-acting pain medicine, and he is trying not to overdo it.  ALLERGIES:  has No Known Allergies.  Meds: Current Outpatient Medications  Medication Sig Dispense Refill  . acetaminophen (TYLENOL) 325 MG tablet Take 2 tablets (650 mg total) by mouth every 6 (six) hours as needed for mild pain. 120 tablet 0  . oxyCODONE (OXYCONTIN) 20 mg 12 hr tablet Take 1 tablet (20 mg total) by mouth every 12 (twelve) hours. 60 tablet 0  . Oxycodone HCl 10 MG TABS Take 2 tablets (20 mg total) by mouth 6 (six) times daily. 360 tablet 0  . ZYTIGA 250 MG tablet TAKE 4 TABLETS BY MOUTH ONCE DAILY AS DIRECTED.  TAKE 1 HOUR BEFORE OR 2 HOURS AFTER A MEAL 120 tablet 11  . polyethylene glycol (MIRALAX / GLYCOLAX) packet Take 17 g by mouth daily. Use OTC Clearax 17 g po daily (Patient not taking: Reported on  12/18/2018) 30 each 2  . ranitidine (ZANTAC) 300 MG tablet Take 1 tablet (300 mg total) by mouth at bedtime. 30 tablet 1   No current facility-administered medications for this encounter.     Physical Findings: The patient is in no acute distress. Patient is alert and oriented.  height is 5\' 9"  (1.753 m) and weight is 192 lb (87.1 kg). His temporal temperature is 98.9 F (37.2 C). His blood pressure is 108/82 and his pulse is 104 (abnormal). His respiration is 18 and oxygen saturation is 99%. . Lungs are clear to auscultation bilaterally. Heart has regular rate and rhythm. No palpable cervical, supraclavicular, or axillary adenopathy. Abdomen soft, non-tender, normal bowel sounds.  Neurological exam was nonfocal. Good strength in his lower extremities.  He continues to wear a back brace when out and about.  Lab Findings: Lab Results  Component Value Date   WBC 7.6 03/20/2019   HGB 14.1 03/20/2019   HCT 41.4 03/20/2019   MCV 84.3 03/20/2019   PLT 273 03/20/2019    Radiographic Findings: No results found.  Impression:  Clinically stable. Pt continues on Zytiga and Lupron.  Lupron is repeated every 4 months.  Patient's energy level and appetite has improved since I last saw him.  He is actually gained approximately 10 pounds.  His pain is well controlled with current regimen of oxycodone and OxyContin.  Plan: Routine follow-up in 6 months.   -----------------------------------  Blair Promise, PhD, MD  This document serves as a record of services personally performed by Gery Pray, MD. It was created on his behalf by Wilburn Mylar, a trained medical scribe. The creation of this record is based on the scribe's personal observations and the provider's statements to them. This document has been checked and approved by the attending provider.

## 2019-03-26 NOTE — Progress Notes (Signed)
Pt presents today for f/u with Dr. Sondra Come. Pt reports "achiness" in his back, rated a 6/10. Pt attributes this to the weather. This RN validated pt that barometric pressure does play a role in pain and the pressure is falling. Pt reports that PSA is elevated since May 2020. Pt states he is settling in to a good routine with long and short acting pain medicine and is trying not to overdo it. Pt needs a refill on Oxycontin, will be out tomorrow.   BP 108/82 (BP Location: Left Arm, Patient Position: Sitting)   Pulse (!) 104   Temp 98.9 F (37.2 C) (Temporal)   Resp 18   Ht 5\' 9"  (1.753 m)   Wt 192 lb (87.1 kg)   SpO2 99%   BMI 28.35 kg/m   Wt Readings from Last 3 Encounters:  03/26/19 192 lb (87.1 kg)  03/20/19 193 lb 6.4 oz (87.7 kg)  12/18/18 190 lb (86.2 kg)   Loma Sousa, RN BSN

## 2019-04-03 MED FILL — ALBUTEROL SULFATE HFA 108 (: 108 (90 BAS | 17 days supply | Qty: 9 | Fill #2

## 2019-04-03 MED FILL — ZYTIGA 250 MG TABLET: 250 | 30 days supply | Qty: 120 | Fill #3

## 2019-04-09 ENCOUNTER — Other Ambulatory Visit: Payer: Self-pay | Admitting: Radiation Oncology

## 2019-04-09 MED ORDER — OXYCODONE HCL 10 MG PO TABS: 20.0000 mg | ORAL_TABLET | Freq: Every day | ORAL | 0 refills | Status: DC

## 2019-04-10 MED FILL — oxyCODONE HCL 10 MG TABS: 10 | 30 days supply | Qty: 360 | Fill #0

## 2019-04-18 ENCOUNTER — Other Ambulatory Visit: Payer: Self-pay | Admitting: Radiation Oncology

## 2019-04-18 MED ORDER — OXYCODONE HCL ER 20 MG PO T12A: 20.0000 mg | EXTENDED_RELEASE_TABLET | Freq: Two times a day (BID) | ORAL | 0 refills | Status: DC

## 2019-04-23 MED FILL — OxyCONTIN 20 MG T12A: 20 | 30 days supply | Qty: 60 | Fill #0

## 2019-04-26 ENCOUNTER — Other Ambulatory Visit: Payer: Self-pay | Admitting: Pharmacist

## 2019-04-26 DIAGNOSIS — C61 Malignant neoplasm of prostate: Secondary | ICD-10-CM

## 2019-04-26 MED ORDER — ZYTIGA 250 MG PO TABS: 1000.0000 mg | ORAL_TABLET | Freq: Every day | ORAL | 11 refills | Status: DC

## 2019-04-26 NOTE — Telephone Encounter (Signed)
Oral Oncology Pharmacist Encounter  Received request from the [facility] for [facility] prescription to be written as "Brand Medically Necessary" as this documentation is required by [facility] prescription coverage in order to cover branded products when a generic formulation is also available. New prescription e-scribed to [facility] outpatient pharmacy with this documentation included.  Johny Drilling, PharmD, BCPS, BCOP  04/26/2019 6:44 AM Oral Oncology Clinic [phone]

## 2019-04-26 NOTE — Telephone Encounter (Signed)
Oral Oncology Pharmacist Encounter  Prescription sent to the pharmacy this morning as previously documented. They are now requesting the "Brand Medically Necessary" documentation to be in the MD's handwriting on printed and signed prescription with MD signature, in order to dispense brand name Zytiga. Message sent to MD about this. I will fax printed prescription with required documentation as above or re-send prescription for generic abiraterone based on MD answer.  Johny Drilling, PharmD, BCPS, BCOP  04/26/2019 9:40 AM Oral Oncology Clinic [phone]

## 2019-04-27 MED FILL — ZYTIGA 250 MG TABLET: 250 | 30 days supply | Qty: 120 | Fill #0

## 2019-04-27 MED FILL — ALBUTEROL SULFATE HFA 108 (: 108 (90 BAS | 17 days supply | Qty: 18 | Fill #0

## 2019-05-08 ENCOUNTER — Other Ambulatory Visit: Payer: Self-pay | Admitting: Radiation Oncology

## 2019-05-08 MED ORDER — OXYCODONE HCL 10 MG PO TABS: 20.0000 mg | ORAL_TABLET | Freq: Every day | ORAL | 0 refills | Status: DC

## 2019-05-08 MED FILL — ZYTIGA 250 MG TABLET: 250 | 30 days supply | Qty: 120 | Fill #0

## 2019-05-08 MED FILL — ALBUTEROL SULFATE HFA 108 (: 108 (90 BAS | 17 days supply | Qty: 18 | Fill #0

## 2019-05-09 MED FILL — oxyCODONE HCL 10 MG TABS: 10 | 30 days supply | Qty: 360 | Fill #0

## 2019-05-20 ENCOUNTER — Other Ambulatory Visit: Payer: Self-pay | Admitting: Radiation Oncology

## 2019-05-20 MED ORDER — OXYCODONE HCL ER 20 MG PO T12A: 20.0000 mg | EXTENDED_RELEASE_TABLET | Freq: Two times a day (BID) | ORAL | 0 refills | Status: DC

## 2019-05-21 MED FILL — OxyCONTIN 20 MG T12A: 20 | 30 days supply | Qty: 60 | Fill #0

## 2019-06-04 ENCOUNTER — Other Ambulatory Visit: Payer: Self-pay | Admitting: Radiation Oncology

## 2019-06-04 MED ORDER — OXYCODONE HCL 10 MG PO TABS: 20.0000 mg | ORAL_TABLET | Freq: Every day | ORAL | 0 refills | Status: DC

## 2019-06-05 MED FILL — ZYTIGA 250 MG TABLET: 250 | 30 days supply | Qty: 120 | Fill #1

## 2019-06-05 MED FILL — oxyCODONE HCL 10 MG TABS: 10 | 30 days supply | Qty: 360 | Fill #0

## 2019-06-13 ENCOUNTER — Other Ambulatory Visit: Payer: Self-pay | Admitting: Radiation Oncology

## 2019-06-13 MED ORDER — OXYCODONE HCL ER 20 MG PO T12A: 20.0000 mg | EXTENDED_RELEASE_TABLET | Freq: Two times a day (BID) | ORAL | 0 refills | Status: DC

## 2019-06-18 MED FILL — OxyCONTIN 20 MG T12A: 20 | 30 days supply | Qty: 60 | Fill #0

## 2019-06-18 MED FILL — CETIRIZINE HCL 10 MG TABS: 10 | 30 days supply | Qty: 30 | Fill #0

## 2019-06-18 MED FILL — TRIAMCINOLONE ACETONIDE 0.1: 0.1 | 30 days supply | Qty: 80 | Fill #0

## 2019-06-18 MED FILL — MUPIROCIN 2% OINTMENT: 2 | 30 days supply | Qty: 22 | Fill #0

## 2019-06-20 MED FILL — ROSUVASTATIN CALCIUM 10 MG: 10 | 30 days supply | Qty: 30 | Fill #0

## 2019-07-02 MED FILL — TRIAMCINOLONE ACETONIDE 0.1: 0.1 | 30 days supply | Qty: 80 | Fill #0

## 2019-07-02 MED FILL — ZYTIGA 250 MG TABLET: 250 | 30 days supply | Qty: 120 | Fill #2

## 2019-07-02 MED FILL — ALBUTEROL SULFATE HFA 108 (: 108 (90 BAS | 17 days supply | Qty: 18 | Fill #1

## 2019-07-02 MED FILL — CETIRIZINE HCL 10 MG TABS: 10 | 30 days supply | Qty: 30 | Fill #0

## 2019-07-02 MED FILL — ROSUVASTATIN CALCIUM 10 MG: 10 | 30 days supply | Qty: 30 | Fill #0

## 2019-07-02 MED FILL — MUPIROCIN 2% OINTMENT: 2 | 30 days supply | Qty: 22 | Fill #0

## 2019-07-05 ENCOUNTER — Other Ambulatory Visit: Payer: Self-pay | Admitting: Radiation Oncology

## 2019-07-05 MED ORDER — OXYCODONE HCL 10 MG PO TABS: 20.0000 mg | ORAL_TABLET | Freq: Every day | ORAL | 0 refills | Status: DC

## 2019-07-05 MED FILL — oxyCODONE HCL 10 MG TABS: 10 | 30 days supply | Qty: 360 | Fill #0

## 2019-07-16 ENCOUNTER — Other Ambulatory Visit: Payer: Self-pay | Admitting: Radiation Oncology

## 2019-07-16 ENCOUNTER — Telehealth: Payer: Self-pay | Admitting: *Deleted

## 2019-07-16 MED ORDER — OXYCODONE HCL ER 20 MG PO T12A: 20.0000 mg | EXTENDED_RELEASE_TABLET | Freq: Two times a day (BID) | ORAL | 0 refills | Status: DC

## 2019-07-16 MED FILL — OxyCONTIN 20 MG T12A: 20 | 30 days supply | Qty: 60 | Fill #0

## 2019-07-16 NOTE — Telephone Encounter (Signed)
CALLED PATIENT TO INFORM OF FU APPT. WITH DR. KINARD ON 09-24-19 @ 3:30 PM, SPOKE WITH PATIENT AND HE AGREED TO THIS APPT.

## 2019-07-19 ENCOUNTER — Encounter: Payer: Self-pay | Admitting: Pharmacy Technician

## 2019-07-20 ENCOUNTER — Inpatient Hospital Stay: Payer: Medicaid Other

## 2019-07-20 ENCOUNTER — Inpatient Hospital Stay: Payer: Medicaid Other | Admitting: Oncology

## 2019-07-26 ENCOUNTER — Inpatient Hospital Stay: Payer: Medicaid Other

## 2019-07-26 ENCOUNTER — Inpatient Hospital Stay: Payer: Medicaid Other | Admitting: Oncology

## 2019-07-27 MED FILL — ALBUTEROL SULFATE HFA 108 (: 108 (90 BAS | 17 days supply | Qty: 18 | Fill #2

## 2019-07-27 MED FILL — ROSUVASTATIN CALCIUM 10 MG: 10 | 30 days supply | Qty: 30 | Fill #1

## 2019-07-27 MED FILL — ZYTIGA 250 MG TABLET: 250 | 30 days supply | Qty: 120 | Fill #3

## 2019-07-27 MED FILL — MUPIROCIN 2% OINTMENT: 2 | 30 days supply | Qty: 22 | Fill #1

## 2019-07-31 ENCOUNTER — Encounter: Payer: Self-pay | Admitting: Pharmacy Technician

## 2019-07-31 ENCOUNTER — Telehealth: Payer: Self-pay | Admitting: Oncology

## 2019-07-31 ENCOUNTER — Other Ambulatory Visit: Payer: Self-pay | Admitting: Radiation Oncology

## 2019-07-31 MED ORDER — OXYCODONE HCL 10 MG PO TABS: 20.0000 mg | ORAL_TABLET | Freq: Every day | ORAL | 0 refills | Status: DC

## 2019-07-31 NOTE — Telephone Encounter (Signed)
Returned patient's phone call regarding rescheduling 01/28 appointments, per providers approval an appointment has been made on 02/10.

## 2019-08-02 MED FILL — oxyCODONE HCL 10 MG TABS: 10 | 30 days supply | Qty: 360 | Fill #0

## 2019-08-03 ENCOUNTER — Other Ambulatory Visit: Payer: Medicaid Other

## 2019-08-08 ENCOUNTER — Inpatient Hospital Stay: Payer: Medicaid Other

## 2019-08-08 ENCOUNTER — Inpatient Hospital Stay (HOSPITAL_BASED_OUTPATIENT_CLINIC_OR_DEPARTMENT_OTHER): Payer: Medicaid Other | Admitting: Oncology

## 2019-08-08 ENCOUNTER — Telehealth: Payer: Self-pay | Admitting: Oncology

## 2019-08-08 ENCOUNTER — Inpatient Hospital Stay: Payer: Medicaid Other | Attending: Oncology

## 2019-08-08 ENCOUNTER — Other Ambulatory Visit: Payer: Self-pay

## 2019-08-08 VITALS — BP 158/100 | HR 72 | Temp 97.4°F | Resp 18 | Ht 69.0 in | Wt 190.8 lb

## 2019-08-08 DIAGNOSIS — Z79818 Long term (current) use of other agents affecting estrogen receptors and estrogen levels: Secondary | ICD-10-CM | POA: Insufficient documentation

## 2019-08-08 DIAGNOSIS — C7951 Secondary malignant neoplasm of bone: Secondary | ICD-10-CM | POA: Insufficient documentation

## 2019-08-08 DIAGNOSIS — Z923 Personal history of irradiation: Secondary | ICD-10-CM | POA: Diagnosis not present

## 2019-08-08 DIAGNOSIS — C61 Malignant neoplasm of prostate: Secondary | ICD-10-CM | POA: Diagnosis present

## 2019-08-08 LAB — CBC WITH DIFFERENTIAL (CANCER CENTER ONLY)
Abs Immature Granulocytes: 0.06 10*3/uL (ref 0.00–0.07)
Basophils Absolute: 0.1 10*3/uL (ref 0.0–0.1)
Basophils Relative: 1 %
Eosinophils Absolute: 0.4 10*3/uL (ref 0.0–0.5)
Eosinophils Relative: 5 %
HCT: 42.6 % (ref 39.0–52.0)
Hemoglobin: 14.9 g/dL (ref 13.0–17.0)
Immature Granulocytes: 1 %
Lymphocytes Relative: 21 %
Lymphs Abs: 1.8 10*3/uL (ref 0.7–4.0)
MCH: 29.3 pg (ref 26.0–34.0)
MCHC: 35 g/dL (ref 30.0–36.0)
MCV: 83.9 fL (ref 80.0–100.0)
Monocytes Absolute: 0.6 10*3/uL (ref 0.1–1.0)
Monocytes Relative: 7 %
Neutro Abs: 5.7 10*3/uL (ref 1.7–7.7)
Neutrophils Relative %: 65 %
Platelet Count: 277 10*3/uL (ref 150–400)
RBC: 5.08 MIL/uL (ref 4.22–5.81)
RDW: 13.8 % (ref 11.5–15.5)
WBC Count: 8.6 10*3/uL (ref 4.0–10.5)
nRBC: 0 % (ref 0.0–0.2)

## 2019-08-08 LAB — CMP (CANCER CENTER ONLY)
ALT: 19 U/L (ref 0–44)
AST: 22 U/L (ref 15–41)
Albumin: 3.6 g/dL (ref 3.5–5.0)
Alkaline Phosphatase: 212 U/L — ABNORMAL HIGH (ref 38–126)
Anion gap: 12 (ref 5–15)
BUN: 16 mg/dL (ref 6–20)
CO2: 25 mmol/L (ref 22–32)
Calcium: 9.2 mg/dL (ref 8.9–10.3)
Chloride: 106 mmol/L (ref 98–111)
Creatinine: 1.23 mg/dL (ref 0.61–1.24)
GFR, Est AFR Am: >60 mL/min (ref >60)
GFR, Estimated: >60 mL/min (ref >60)
Glucose, Bld: 133 mg/dL — ABNORMAL HIGH (ref 70–99)
Potassium: 4.3 mmol/L (ref 3.5–5.1)
Sodium: 143 mmol/L (ref 135–145)
Total Bilirubin: 0.7 mg/dL (ref 0.3–1.2)
Total Protein: 8.5 g/dL — ABNORMAL HIGH (ref 6.5–8.1)

## 2019-08-08 MED ORDER — LEUPROLIDE ACETATE (4 MONTH) 30 MG ~~LOC~~ KIT
30.0000 mg | PACK | Freq: Once | SUBCUTANEOUS | Status: AC
  Administered 2019-08-08: 30 mg via SUBCUTANEOUS
  Filled 2019-08-08: qty 30

## 2019-08-08 NOTE — Patient Instructions (Signed)

## 2019-08-08 NOTE — Telephone Encounter (Signed)
Scheduled appt per 2/10 los.  Sent a message to HIM pool to get a calendar mailed out. 

## 2019-08-08 NOTE — Progress Notes (Signed)
Hematology and Oncology Follow Up Visit  Jonathan Campbell [id number] [birth year] 59 y.o. 08/08/2019 10:42 AM Patient, Jonathan Pcp PerNo ref. provider Campbell   Principle Diagnosis: 59 year old man with advanced prostate cancer diagnosed in March 2019 after presenting with a PSA of 819 and disease to the bone.  He has castration-sensitive at this time.   Prior Therapy:  Radiation therapy between September 16, 2017 and September 29, 2017 to complete 230 Gy in 10 fractions to thoracic spine.   He completed 4 weeks of Casodex between April and May 2019.  Current therapy:  Lupron 30 mg given starting on 10/05/2017.  This will be repeated every 4 months.  Zytiga 1000 mg daily started in May 2019.  Interim History: Jonathan Campbell is here for a follow-up visit.  Since the last visit, he reports Jonathan major changes in his health.  He continues to tolerate Zytiga without any complaints.  He denies nausea, vomiting or abdominal pain.  Denies recent hospitalization or illnesses.  His performance status quality of life remain excellent.  He denies any worsening bone pain or pathological fractures.       Medications: Unchanged on review. Current Outpatient Medications  Medication Sig Dispense Refill  . acetaminophen (TYLENOL) 325 MG tablet Take 2 tablets (650 mg total) by mouth every 6 (six) hours as needed for mild pain. 120 tablet 0  . oxyCODONE (OXYCONTIN) 20 mg 12 hr tablet Take 1 tablet (20 mg total) by mouth every 12 (twelve) hours. 60 tablet 0  . Oxycodone HCl 10 MG TABS Take 2 tablets (20 mg total) by mouth 6 (six) times daily. 360 tablet 0  . polyethylene glycol (MIRALAX / GLYCOLAX) packet Take 17 g by mouth daily. Use OTC Clearax 17 g po daily (Patient not taking: Reported on 12/18/2018) 30 each 2  . ranitidine (ZANTAC) 300 MG tablet Take 1 tablet (300 mg total) by mouth at bedtime. 30 tablet 1  . ZYTIGA 250 MG tablet Take 4 tablets (1,000 mg total) by mouth daily. Take on an empty stomach 1 hour before or 2 hours  after a meal 120 tablet 11   Jonathan current facility-administered medications for this visit.     Allergies: Jonathan Known Allergies     Physical Exam:  Blood pressure (!) 158/100, pulse 72, temperature (!) 97.4 F (36.3 C), temperature source Temporal, resp. rate 18, height 5\' 9"  (1.753 m), weight 190 lb 12.8 oz (86.5 kg), SpO2 100 %.      ECOG: 1   General appearance: Comfortable appearing without any discomfort Head: Normocephalic without any trauma Oropharynx: Mucous membranes are moist and pink without any thrush or ulcers. Eyes: Pupils are equal and round reactive to light. Lymph nodes: Jonathan cervical, supraclavicular, inguinal or axillary lymphadenopathy.   Heart:regular rate and rhythm.  S1 and S2 without leg edema. Lung: Clear without any rhonchi or wheezes.  Jonathan dullness to percussion. Abdomin: Soft, nontender, nondistended with good bowel sounds.  Jonathan hepatosplenomegaly. Musculoskeletal: Jonathan joint deformity or effusion.  Full range of motion noted. Neurological: Jonathan deficits noted on motor, sensory and deep tendon reflex exam. Skin: Jonathan petechial rash or dryness.  Appeared moist.           Lab Results: Lab Results  Component Value Date   WBC 7.6 03/20/2019   HGB 14.1 03/20/2019   HCT 41.4 03/20/2019   MCV 84.3 03/20/2019   PLT 273 03/20/2019     Chemistry      Component Value Date/Time   NA 142 03/20/2019  0921   K 4.3 03/20/2019 0921   CL 109 03/20/2019 0921   CO2 24 03/20/2019 0921   BUN 14 03/20/2019 0921   CREATININE 1.39 (H) 03/20/2019 0921      Component Value Date/Time   CALCIUM 9.5 03/20/2019 0921   ALKPHOS 198 (H) 03/20/2019 0921   AST 23 03/20/2019 0921   ALT 18 03/20/2019 0921   BILITOT 0.6 03/20/2019 0921       Results for Jonathan Campbell, Jonathan Campbell (MRN [id number]) as of 08/08/2019 10:44  Ref. Range 06/06/2018 11:20 10/27/2018 12:36 03/20/2019 09:21  Prostate Specific Ag, Serum Latest Ref Range: 0.0 - 4.0 ng/mL 8.5 (H) 6.8 (H) 7.6 (H)        Impression and Plan:  59 year old man with  1.  Advanced prostate cancer with bone disease and lymphadenopathy diagnosed in March 2019.  He has castration-sensitive at this time.    He remains on Zytiga with PSA overall stable after a plateau around 6.8.  Risks and benefits of continuing this therapy was discussed today.  Different salvage therapy options if he develops castration-resistant disease were reviewed.  These options would include systemic chemotherapy, Jonathan Campbell among others.  For the time being we will continue with Zytiga and monitor closely.  2.  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  Jonathan recent increase in his pain level.   3.  Bone directed therapy: He is on calcium and vitamin D supplements Xgeva option has been deferred.  4.  Androgen deprivation: He received Eligard in September and will be repeated today.  Complication associated with this treatment including weight gain, hot flashes and sexual dysfunction were reiterated.  He is agreeable to continue.  5.  Prognosis and goals of care: his disease remains incurable although aggressive measures are warranted  6.  Follow-up: In 4 months for repeat evaluation.  30 minutes was dedicated to this visit.  The time was spent on reviewing his laboratory data, disease status update, treatment options and addressing complications and future plan of care.   Zola Button, MD 2/10/202110:42 AM

## 2019-08-09 ENCOUNTER — Other Ambulatory Visit: Payer: Self-pay | Admitting: Oncology

## 2019-08-09 DIAGNOSIS — C61 Malignant neoplasm of prostate: Secondary | ICD-10-CM

## 2019-08-09 LAB — PROSTATE-SPECIFIC AG, SERUM (LABCORP): Prostate Specific Ag, Serum: 26.7 ng/mL — ABNORMAL HIGH (ref 0.0–4.0)

## 2019-08-09 NOTE — Progress Notes (Signed)
His a PSA results does show rather rapid increase in the last 4 months although he is clinically stable.  Alkaline phosphatase remains relatively stable however.  I recommended repeat CT scan and a bone scan for staging purposes and consideration for different salvage therapy.  These options would include Xtandi versus systemic chemotherapy which I favor if he has clear progression to castration-resistant disease.

## 2019-08-13 ENCOUNTER — Other Ambulatory Visit: Payer: Self-pay | Admitting: Radiation Oncology

## 2019-08-13 MED ORDER — OXYCODONE HCL ER 20 MG PO T12A: 20.0000 mg | EXTENDED_RELEASE_TABLET | Freq: Two times a day (BID) | ORAL | 0 refills | Status: DC

## 2019-08-13 MED FILL — OxyCONTIN 20 MG T12A: 20 | 30 days supply | Qty: 60 | Fill #0

## 2019-08-23 ENCOUNTER — Other Ambulatory Visit: Payer: Self-pay | Admitting: Radiation Oncology

## 2019-08-23 MED ORDER — OXYCODONE HCL 10 MG PO TABS: 20.0000 mg | ORAL_TABLET | Freq: Every day | ORAL | 0 refills | Status: DC

## 2019-08-28 ENCOUNTER — Ambulatory Visit (HOSPITAL_COMMUNITY)
Admission: RE | Admit: 2019-08-28 | Discharge: 2019-08-28 | Disposition: A | Discharge status: Home / Self Care | Payer: Medicaid Other | Source: Ambulatory Visit | Attending: Oncology | Admitting: Oncology

## 2019-08-28 ENCOUNTER — Encounter (HOSPITAL_COMMUNITY)
Admission: RE | Admit: 2019-08-28 | Discharge: 2019-08-28 | Disposition: A | Discharge status: Home / Self Care | Payer: Medicaid Other | Source: Ambulatory Visit | Attending: Oncology | Admitting: Oncology

## 2019-08-28 ENCOUNTER — Other Ambulatory Visit: Payer: Self-pay

## 2019-08-28 ENCOUNTER — Encounter (HOSPITAL_COMMUNITY): Payer: Self-pay | Admitting: Radiology

## 2019-08-28 DIAGNOSIS — C7951 Secondary malignant neoplasm of bone: Secondary | ICD-10-CM

## 2019-08-28 DIAGNOSIS — C61 Malignant neoplasm of prostate: Secondary | ICD-10-CM

## 2019-08-28 MED ORDER — TECHNETIUM TC 99M MEDRONATE IV KIT
20.1000 | PACK | Freq: Once | INTRAVENOUS | Status: AC | PRN
  Administered 2019-08-28: 20.1 via INTRAVENOUS

## 2019-08-28 MED ORDER — IOHEXOL 300 MG/ML  SOLN
100.0000 mL | Freq: Once | INTRAMUSCULAR | Status: AC | PRN
  Administered 2019-08-28: 100 mL via INTRAVENOUS

## 2019-08-28 MED ORDER — SODIUM CHLORIDE (PF) 0.9 % IJ SOLN
INTRAMUSCULAR | Status: AC
  Filled 2019-08-28: qty 50

## 2019-08-28 MED FILL — ZYTIGA 250 MG TABLET: 250 | 30 days supply | Qty: 120 | Fill #4

## 2019-08-29 ENCOUNTER — Telehealth: Payer: Self-pay

## 2019-08-29 NOTE — Telephone Encounter (Signed)
-----   Message from Wyatt Portela, MD sent at 08/29/2019 10:53 AM EST ----- I will discuss it with him. Thanks ----- Message ----- From: Tami Lin, RN Sent: 08/29/2019  10:39 AM EST To: Wyatt Portela, MD  Patient called and stated he had a bone scan and CT scan yesterday. His next appointment is in June. He wants to know if you will call him with results or if he needs to come in for an appointment. Lanelle Bal

## 2019-08-29 NOTE — Telephone Encounter (Signed)
Patient made aware that Dr. Alen Blew will contact him to discuss.

## 2019-08-30 MED FILL — oxyCODONE HCL 10 MG TABS: 10 | 30 days supply | Qty: 360 | Fill #0

## 2019-09-02 NOTE — Progress Notes (Signed)
Radiation Oncology         ([phone]) [phone] ________________________________  Name: Jonathan Campbell MRN: [id number]  Date: 09/03/2019  DOB: 11/05/1960  Follow-Up Visit Note  CC: Patient, No Pcp Per  No ref. provider found    ICD-10-CM   1. Prostate cancer metastatic to bone [facility])  C61    C79.51     Diagnosis: Prostate cancer metastatic to bone   Interval Since Last Radiation: One year, three weeks, and four days.  07/26/2018 - 08/08/2018: Rightsuperior glenoid of right shoulder / 30 Gy in 10 fractions of 3 Gy  12/01/2017 - 12/14/2017: 1. Left Scapula / 30 Gy in 10 fractions 2. Lumbar Spine (L4 region) / 30 Gy in 10 fractions  09/16/2017 - 09/29/2017: T Spine / 30 Gy in 10 fractions  Narrative:  The patient is here for routine follow-up. He continues on Lupron every 4 months and Zytiga daily under Dr. Alen Blew, whom he last saw on 08/08/2019. At that time, he did not have any significant complaints. His PSA did show a rather rapid increase but he was clinically stable. Dr. Alen Blew recommended a repeat CT scan and bone scan for restaging purposes and consideration of different salvage therapy.  CT scan of abdomen/pelvis on 08/28/2019 showed near complete resolution of bilateral iliac and abdominal retroperitoneal lymphadenopathy. The largest remaining lymph node in the left external iliac chain measured 7 mm and was not considered pathologically enlarged. However, there was a new 1.2 cm sclerotic lesion in the left ischial tuberosity and sub-cm sclerotic focus in the L5 vertebral body, consistent with bone metastases.  Bone scan on 08/28/2019 showed osseous metastasis at the L2 vertebral body without other new worrisome scintigraphic abnormalities.  On review of systems, he reports chronic back pain and left shoulder pain. He denies all other symptoms.  ALLERGIES:  has No Known Allergies.  Meds: Current Outpatient Medications  Medication Sig Dispense Refill  . acetaminophen (TYLENOL) 325  MG tablet Take 2 tablets (650 mg total) by mouth every 6 (six) hours as needed for mild pain. 120 tablet 0  . oxyCODONE (OXYCONTIN) 20 mg 12 hr tablet Take 1 tablet (20 mg total) by mouth every 12 (twelve) hours. 60 tablet 0  . Oxycodone HCl 10 MG TABS Take 2 tablets (20 mg total) by mouth 6 (six) times daily. 360 tablet 0  . ZYTIGA 250 MG tablet Take 4 tablets (1,000 mg total) by mouth daily. Take on an empty stomach 1 hour before or 2 hours after a meal 120 tablet 11  . polyethylene glycol (MIRALAX / GLYCOLAX) packet Take 17 g by mouth daily. Use OTC Clearax 17 g po daily (Patient not taking: Reported on 12/18/2018) 30 each 2  . ranitidine (ZANTAC) 300 MG tablet Take 1 tablet (300 mg total) by mouth at bedtime. 30 tablet 1   No current facility-administered medications for this encounter.    Physical Findings: The patient is in no acute distress. Patient is alert and oriented.  height is 5\' 9"  (1.753 m) and weight is 191 lb 6 oz (86.8 kg). His temporal temperature is 98 F (36.7 C). His blood pressure is 149/92 (abnormal) and his pulse is 67. His respiration is 18 and oxygen saturation is 99%. . Lungs are clear to auscultation bilaterally. Heart has regular rate and rhythm. No palpable cervical, supraclavicular, or axillary adenopathy. Abdomen soft, non-tender, normal bowel sounds.  Neurological exam was nonfocal. Good strength in his lower extremities.  He continues to wear a back brace when out  and about.  He has put on some weight since his last follow-up.  Lab Findings: Lab Results  Component Value Date   WBC 8.6 08/08/2019   HGB 14.9 08/08/2019   HCT 42.6 08/08/2019   MCV 83.9 08/08/2019   PLT 277 08/08/2019    Radiographic Findings: NM Bone Scan Whole Body  Result Date: 08/28/2019 CLINICAL DATA:  Prostate cancer, PSA 26.7 EXAM: NUCLEAR MEDICINE WHOLE BODY BONE SCAN TECHNIQUE: Whole body anterior and posterior images were obtained approximately 3 hours after intravenous injection of  radiopharmaceutical. RADIOPHARMACEUTICALS:  20.1 mCi Technetium-72m MDP IV COMPARISON:  06/16/2018 Radiographic correlation: CT abdomen and pelvis 08/28/2019 FINDINGS: Minimal uptake at the sternoclavicular joints and knees, typically degenerative. Uptake at L2 vertebral body concerning for osseous metastasis; L2 vertebral body demonstrates mild diffuse increased attenuation on CT likely representing metastatic disease. Uptake at T12 corresponds to spinal augmentation procedure. Uptake at several RIGHT costovertebral junctions likely degenerative. Uptake at LEFT L4-L5 facet joint, degenerative. IMPRESSION: Osseous metastasis at L2 vertebral body. No other new worrisome scintigraphic abnormalities. Electronically Signed   By: Lavonia Dana M.D.   On: 08/28/2019 17:51   CT Abdomen Pelvis W Contrast  Result Date: 08/28/2019 CLINICAL DATA:  Follow-up metastatic prostate carcinoma. Rising PSA level. EXAM: CT ABDOMEN AND PELVIS WITH CONTRAST TECHNIQUE: Multidetector CT imaging of the abdomen and pelvis was performed using the standard protocol following bolus administration of intravenous contrast. CONTRAST:  171mL OMNIPAQUE IOHEXOL 300 MG/ML  SOLN COMPARISON:  09/12/2017 FINDINGS: Lower Chest: No acute findings. Hepatobiliary: Numerous cysts are again seen throughout the right and left lobes for, however no solid masses are identified. Gallbladder is unremarkable. No evidence of biliary ductal dilatation. Pancreas:  No mass or inflammatory changes. Spleen: Within normal limits in size and appearance. Adrenals/Urinary Tract: No masses identified. A few tiny sub-cm renal cysts are noted bilaterally. No evidence of ureteral calculi or hydronephrosis. Stomach/Bowel: No evidence of obstruction, inflammatory process or abnormal fluid collections. Normal appendix visualized. Vascular/Lymphatic: There has been interval resolution of bilateral iliac and abdominal retroperitoneal lymphadenopathy since previous study. The largest  remaining lymph node in the left external iliac chain measures 7 mm on image 75/2. No pathologically enlarged lymph nodes. No abdominal aortic aneurysm. Reproductive: Mildly enlarged prostate gland shows decrease in size since previous study. Other:  None. Musculoskeletal: A single small sclerotic bone lesion is seen in the left ischial tuberosity measuring 1.2 cm on image 87/series 2, which was not seen on previous study. No other suspicious bone lesions identified. Previous lower thoracic vertebroplasty noted. IMPRESSION: 1. Near complete resolution of bilateral iliac and abdominal retroperitoneal lymphadenopathy since previous study. Largest remaining lymph node in left external iliac chain measures 7 mm, and is not considered pathologically enlarged. 2. New 1.2 cm sclerotic lesion in the left ischial tuberosity and sub-cm sclerotic focus in the L5 vertebral body, consistent with bone metastases. Electronically Signed   By: Marlaine Hind M.D.   On: 08/28/2019 15:56    Impression: Prostate cancer metastatic to bone  Clinically stable at this time.  He continues to have chronic pain requiring narcotics.  Patient will have refill of his OxyContin later this week.  I reviewed the patient's most recent images with him in detail.  He does have activity lumbar spine at L2 which is not been treated but immediately above and below this area has received radiation therapy.  This would technically be very difficult to treat and would recommend following this area.  If this becomes more significant  on imaging or he has more pain in the lumbar spine may need to consider treatment to this area using stereotactic body radiation therapy with concise stabilization  Plan: Patient will follow up with Dr. Alen Blew in three months.  Routine follow-up in radiation oncology in 6 months. -----------------------------------  Blair Promise, PhD, MD  This document serves as a record of services personally performed by Gery Pray, MD. It was created on his behalf by Clerance Lav, a trained medical scribe. The creation of this record is based on the scribe's personal observations and the provider's statements to them. This document has been checked and approved by the attending provider.

## 2019-09-03 ENCOUNTER — Ambulatory Visit
Admission: RE | Admit: 2019-09-03 | Discharge: 2019-09-03 | Disposition: A | Discharge status: Home / Self Care | Payer: Medicaid Other | Source: Ambulatory Visit | Attending: Radiation Oncology | Admitting: Radiation Oncology

## 2019-09-03 ENCOUNTER — Other Ambulatory Visit: Payer: Self-pay

## 2019-09-03 ENCOUNTER — Encounter: Payer: Self-pay | Admitting: Radiation Oncology

## 2019-09-03 VITALS — BP 149/92 | HR 67 | Temp 98.0°F | Resp 18 | Ht 69.0 in | Wt 191.4 lb

## 2019-09-03 DIAGNOSIS — C61 Malignant neoplasm of prostate: Secondary | ICD-10-CM

## 2019-09-03 DIAGNOSIS — M549 Dorsalgia, unspecified: Secondary | ICD-10-CM | POA: Insufficient documentation

## 2019-09-03 DIAGNOSIS — G8929 Other chronic pain: Secondary | ICD-10-CM | POA: Insufficient documentation

## 2019-09-03 DIAGNOSIS — C7951 Secondary malignant neoplasm of bone: Secondary | ICD-10-CM | POA: Diagnosis not present

## 2019-09-03 DIAGNOSIS — Z923 Personal history of irradiation: Secondary | ICD-10-CM | POA: Diagnosis not present

## 2019-09-03 DIAGNOSIS — M25512 Pain in left shoulder: Secondary | ICD-10-CM | POA: Insufficient documentation

## 2019-09-03 DIAGNOSIS — Z79899 Other long term (current) drug therapy: Secondary | ICD-10-CM | POA: Insufficient documentation

## 2019-09-03 MED ORDER — OXYCODONE HCL ER 20 MG PO T12A: 20.0000 mg | EXTENDED_RELEASE_TABLET | Freq: Two times a day (BID) | ORAL | 0 refills | Status: DC

## 2019-09-03 NOTE — Patient Instructions (Signed)
Coronavirus (COVID-19) Are you at risk?  Are you at risk for the Coronavirus (COVID-19)?  To be considered HIGH RISK for Coronavirus (COVID-19), you have to meet the following criteria:  . Traveled to [place], [place], [place], [place] or [place]; or in the [place] to [place], [place], [place], or [place]; and have fever, cough, and shortness of breath within the last 2 weeks of travel OR . Been in close contact with a person diagnosed with COVID-19 within the last 2 weeks and have fever, cough, and shortness of breath . IF YOU DO NOT MEET THESE CRITERIA, YOU ARE CONSIDERED LOW RISK FOR COVID-19.  What to do if you are HIGH RISK for COVID-19?  . If you are having a medical emergency, call 911. . Seek medical care right away. Before you go to a doctor's office, urgent care or emergency department, call ahead and tell them about your recent travel, contact with someone diagnosed with COVID-19, and your symptoms. You should receive instructions from your physician's office regarding next steps of care.  . When you arrive at healthcare provider, tell the healthcare staff immediately you have returned from visiting [place], [place], [place], [place] or [place]; or traveled in the [place] to [place], [place], [place], or [place]; in the last two weeks or you have been in close contact with a person diagnosed with COVID-19 in the last 2 weeks.   . Tell the health care staff about your symptoms: fever, cough and shortness of breath. . After you have been seen by a medical provider, you will be either: o Tested for (COVID-19) and discharged home on quarantine except to seek medical care if symptoms worsen, and asked to  - Stay home and avoid contact with others until you get your results (4-5 days)  - Avoid travel on public transportation if possible (such as bus, train, or airplane) or o Sent to the Emergency Department by EMS for evaluation, COVID-19 testing, and possible  admission depending on your condition and test results.  What to do if you are LOW RISK for COVID-19?  Reduce your risk of any infection by using the same precautions used for avoiding the common cold or flu:  . Wash your hands often with soap and warm water for at least 20 seconds.  If soap and water are not readily available, use an alcohol-based hand sanitizer with at least 60% alcohol.  . If coughing or sneezing, cover your mouth and nose by coughing or sneezing into the elbow areas of your shirt or coat, into a tissue or into your sleeve (not your hands). . Avoid shaking hands with others and consider head nods or verbal greetings only. . Avoid touching your eyes, nose, or mouth with unwashed hands.  . Avoid close contact with people who are sick. . Avoid places or events with large numbers of people in one location, like concerts or sporting events. . Carefully consider travel plans you have or are making. . If you are planning any travel outside or inside the [place], visit the CDC's Travelers' Health webpage for the latest health notices. . If you have some symptoms but not all symptoms, continue to monitor at home and seek medical attention if your symptoms worsen. . If you are having a medical emergency, call 911.   ADDITIONAL HEALTHCARE OPTIONS FOR PATIENTS  [facility] / e-Visit: [email or web address]         [facility]: [phone]  [place]   [facility]: [phone]                   [facility]: [phone]   

## 2019-09-03 NOTE — Progress Notes (Signed)
Jonathan Campbell presents today for f/u with Dr. Sondra Come. Pt has had recent imaging and would like Dr. Clabe Seal opinion. Pt reports chronic pain in back and LEFT shoulder, rated 8/10. Pt doing well otherwise.   BP (!) 149/92 (BP Location: Right Arm, Patient Position: Sitting)   Pulse 67   Temp 98 F (36.7 C) (Temporal)   Resp 18   Ht 5\' 9"  (1.753 m)   Wt 191 lb 6 oz (86.8 kg)   SpO2 99%   BMI 28.26 kg/m  Wt Readings from Last 3 Encounters:  09/03/19 191 lb 6 oz (86.8 kg)  08/08/19 190 lb 12.8 oz (86.5 kg)  03/26/19 192 lb (87.1 kg)   Loma Sousa, RN BSN

## 2019-09-07 MED FILL — OxyCONTIN 20 MG T12A: 20 | 30 days supply | Qty: 60 | Fill #0

## 2019-09-19 MED FILL — ZYTIGA 250 MG TABLET: 250 | 30 days supply | Qty: 120 | Fill #5

## 2019-09-24 ENCOUNTER — Encounter: Payer: Self-pay | Admitting: Radiation Oncology

## 2019-09-24 ENCOUNTER — Other Ambulatory Visit: Payer: Self-pay

## 2019-09-24 ENCOUNTER — Ambulatory Visit: Payer: Self-pay | Admitting: Radiation Oncology

## 2019-09-24 ENCOUNTER — Ambulatory Visit
Admission: RE | Admit: 2019-09-24 | Discharge: 2019-09-24 | Disposition: A | Discharge status: Home / Self Care | Payer: Medicaid Other | Source: Ambulatory Visit | Attending: Radiation Oncology | Admitting: Radiation Oncology

## 2019-09-24 VITALS — BP 130/79 | HR 109 | Temp 98.0°F | Resp 18 | Ht 69.0 in | Wt 186.2 lb

## 2019-09-24 DIAGNOSIS — G8929 Other chronic pain: Secondary | ICD-10-CM | POA: Diagnosis not present

## 2019-09-24 DIAGNOSIS — C61 Malignant neoplasm of prostate: Secondary | ICD-10-CM | POA: Diagnosis not present

## 2019-09-24 DIAGNOSIS — C7951 Secondary malignant neoplasm of bone: Secondary | ICD-10-CM | POA: Insufficient documentation

## 2019-09-24 DIAGNOSIS — Z79899 Other long term (current) drug therapy: Secondary | ICD-10-CM | POA: Insufficient documentation

## 2019-09-24 DIAGNOSIS — Z923 Personal history of irradiation: Secondary | ICD-10-CM | POA: Diagnosis not present

## 2019-09-24 MED ORDER — OXYCODONE HCL 10 MG PO TABS: 20.0000 mg | ORAL_TABLET | Freq: Every day | ORAL | 0 refills | Status: DC

## 2019-09-24 NOTE — Progress Notes (Signed)
Needs pain medication filled for pick up on Friday as he will be out Oxycodone 10mg  IR.

## 2019-09-24 NOTE — Progress Notes (Signed)
Radiation Oncology         [phone] ________________________________  Name: Jonathan Campbell MRN: [id number]  Date: 09/24/2019  DOB: [birth year]  Follow-Up Visit Note  CC: Patient, No Pcp Per  Domenic Polite, MD    ICD-10-CM   1. Prostate cancer metastatic to bone [facility])  C61    C79.51     Diagnosis: Prostate cancer metastatic to bone   Interval Since Last Radiation: One year, one month, two weeks, and four days.  07/26/2018 - 08/08/2018: Rightsuperior glenoid of right shoulder / 30 Gy in 10 fractions of 3 Gy  12/01/2017 - 12/14/2017: 1. Left Scapula / 30 Gy in 10 fractions 2. Lumbar Spine (L4 region) / 30 Gy in 10 fractions  09/16/2017 - 09/29/2017: T Spine / 30 Gy in 10 fractions  Narrative:  The patient returns today for unscheduled follow-up.  He was seen earlier this month to review his most recent scans.  Is not having the significant change in his pain level since that evaluation.  He has felt well out of his oxycodone and I will refill that later today  On review of systems, he reports good pain control with his current narcotic regimen. He denies numbness or weakness in his lower extremities.  He is trying to walk every day to maintain his strength.  Continues to wear a back brace as this provides comfort for him.  ALLERGIES:  has No Known Allergies.  Meds: Current Outpatient Medications  Medication Sig Dispense Refill  . acetaminophen (TYLENOL) 325 MG tablet Take 2 tablets (650 mg total) by mouth every 6 (six) hours as needed for mild pain. 120 tablet 0  . oxyCODONE (OXYCONTIN) 20 mg 12 hr tablet Take 1 tablet (20 mg total) by mouth every 12 (twelve) hours. 60 tablet 0  . Oxycodone HCl 10 MG TABS Take 2 tablets (20 mg total) by mouth 6 (six) times daily. 360 tablet 0  . polyethylene glycol (MIRALAX / GLYCOLAX) packet Take 17 g by mouth daily. Use OTC Clearax 17 g po daily 30 each 2  . ZYTIGA 250 MG tablet Take 4 tablets (1,000 mg total) by mouth daily. Take on an  empty stomach 1 hour before or 2 hours after a meal 120 tablet 11  . ranitidine (ZANTAC) 300 MG tablet Take 1 tablet (300 mg total) by mouth at bedtime. 30 tablet 1   No current facility-administered medications for this encounter.    Physical Findings: The patient is in no acute distress. Patient is alert and oriented.  height is 5\' 9"  (1.753 m) and weight is 186 lb 4 oz (84.5 kg). His temporal temperature is 98 F (36.7 C). His blood pressure is 130/79 and his pulse is 109 (abnormal). His respiration is 18 and oxygen saturation is 97%.  Lungs are clear to auscultation bilaterally. Heart has regular rate and rhythm. No palpable cervical, supraclavicular, or axillary adenopathy. Abdomen soft, non-tender, normal bowel sounds.  Neurological exam was nonfocal. Good strength in his lower extremities.  He continues to wear a back brace when out and about.   Lab Findings: Lab Results  Component Value Date   WBC 8.6 08/08/2019   HGB 14.9 08/08/2019   HCT 42.6 08/08/2019   MCV 83.9 08/08/2019   PLT 277 08/08/2019    Radiographic Findings: NM Bone Scan Whole Body  Result Date: 08/28/2019 CLINICAL DATA:  Prostate cancer, PSA 26.7 EXAM: NUCLEAR MEDICINE WHOLE BODY BONE SCAN TECHNIQUE: Whole body anterior and posterior images were obtained approximately 3 hours  after intravenous injection of radiopharmaceutical. RADIOPHARMACEUTICALS:  20.1 mCi Technetium-97m MDP IV COMPARISON:  06/16/2018 Radiographic correlation: CT abdomen and pelvis 08/28/2019 FINDINGS: Minimal uptake at the sternoclavicular joints and knees, typically degenerative. Uptake at L2 vertebral body concerning for osseous metastasis; L2 vertebral body demonstrates mild diffuse increased attenuation on CT likely representing metastatic disease. Uptake at T12 corresponds to spinal augmentation procedure. Uptake at several RIGHT costovertebral junctions likely degenerative. Uptake at LEFT L4-L5 facet joint, degenerative. IMPRESSION: Osseous  metastasis at L2 vertebral body. No other new worrisome scintigraphic abnormalities. Electronically Signed   By: Lavonia Dana M.D.   On: 08/28/2019 17:51   CT Abdomen Pelvis W Contrast  Result Date: 08/28/2019 CLINICAL DATA:  Follow-up metastatic prostate carcinoma. Rising PSA level. EXAM: CT ABDOMEN AND PELVIS WITH CONTRAST TECHNIQUE: Multidetector CT imaging of the abdomen and pelvis was performed using the standard protocol following bolus administration of intravenous contrast. CONTRAST:  169mL OMNIPAQUE IOHEXOL 300 MG/ML  SOLN COMPARISON:  09/12/2017 FINDINGS: Lower Chest: No acute findings. Hepatobiliary: Numerous cysts are again seen throughout the right and left lobes for, however no solid masses are identified. Gallbladder is unremarkable. No evidence of biliary ductal dilatation. Pancreas:  No mass or inflammatory changes. Spleen: Within normal limits in size and appearance. Adrenals/Urinary Tract: No masses identified. A few tiny sub-cm renal cysts are noted bilaterally. No evidence of ureteral calculi or hydronephrosis. Stomach/Bowel: No evidence of obstruction, inflammatory process or abnormal fluid collections. Normal appendix visualized. Vascular/Lymphatic: There has been interval resolution of bilateral iliac and abdominal retroperitoneal lymphadenopathy since previous study. The largest remaining lymph node in the left external iliac chain measures 7 mm on image 75/2. No pathologically enlarged lymph nodes. No abdominal aortic aneurysm. Reproductive: Mildly enlarged prostate gland shows decrease in size since previous study. Other:  None. Musculoskeletal: A single small sclerotic bone lesion is seen in the left ischial tuberosity measuring 1.2 cm on image 87/series 2, which was not seen on previous study. No other suspicious bone lesions identified. Previous lower thoracic vertebroplasty noted. IMPRESSION: 1. Near complete resolution of bilateral iliac and abdominal retroperitoneal lymphadenopathy  since previous study. Largest remaining lymph node in left external iliac chain measures 7 mm, and is not considered pathologically enlarged. 2. New 1.2 cm sclerotic lesion in the left ischial tuberosity and sub-cm sclerotic focus in the L5 vertebral body, consistent with bone metastases. Electronically Signed   By: Marlaine Hind M.D.   On: 08/28/2019 15:56    Impression: Prostate cancer metastatic to bone  Clinically stable at this time.  He continues to have chronic pain requiring narcotics.  Oxycodone refilled today.  Plan: Patient will follow up with Dr. Alen Blew on 12/06/2019.  Routine follow-up in radiation oncology in 6 months. -----------------------------------  Blair Promise, PhD, MD  This document serves as a record of services personally performed by Gery Pray, MD. It was created on his behalf by Clerance Lav, a trained medical scribe. The creation of this record is based on the scribe's personal observations and the provider's statements to them. This document has been checked and approved by the attending provider.

## 2019-09-24 NOTE — Patient Instructions (Signed)
Coronavirus (COVID-19) Are you at risk?  Are you at risk for the Coronavirus (COVID-19)?  To be considered HIGH RISK for Coronavirus (COVID-19), you have to meet the following criteria:  . Traveled to [place], [place], [place], [place] or [place]; or in the [place] to [place], [place], [place], or [place]; and have fever, cough, and shortness of breath within the last 2 weeks of travel OR . Been in close contact with a person diagnosed with COVID-19 within the last 2 weeks and have fever, cough, and shortness of breath . IF YOU DO NOT MEET THESE CRITERIA, YOU ARE CONSIDERED LOW RISK FOR COVID-19.  What to do if you are HIGH RISK for COVID-19?  . If you are having a medical emergency, call 911. . Seek medical care right away. Before you go to a doctor's office, urgent care or emergency department, call ahead and tell them about your recent travel, contact with someone diagnosed with COVID-19, and your symptoms. You should receive instructions from your physician's office regarding next steps of care.  . When you arrive at healthcare provider, tell the healthcare staff immediately you have returned from visiting [place], [place], [place], [place] or [place]; or traveled in the [place] to [place], [place], [place], or [place]; in the last two weeks or you have been in close contact with a person diagnosed with COVID-19 in the last 2 weeks.   . Tell the health care staff about your symptoms: fever, cough and shortness of breath. . After you have been seen by a medical provider, you will be either: o Tested for (COVID-19) and discharged home on quarantine except to seek medical care if symptoms worsen, and asked to  - Stay home and avoid contact with others until you get your results (4-5 days)  - Avoid travel on public transportation if possible (such as bus, train, or airplane) or o Sent to the Emergency Department by EMS for evaluation, COVID-19 testing, and possible  admission depending on your condition and test results.  What to do if you are LOW RISK for COVID-19?  Reduce your risk of any infection by using the same precautions used for avoiding the common cold or flu:  . Wash your hands often with soap and warm water for at least 20 seconds.  If soap and water are not readily available, use an alcohol-based hand sanitizer with at least 60% alcohol.  . If coughing or sneezing, cover your mouth and nose by coughing or sneezing into the elbow areas of your shirt or coat, into a tissue or into your sleeve (not your hands). . Avoid shaking hands with others and consider head nods or verbal greetings only. . Avoid touching your eyes, nose, or mouth with unwashed hands.  . Avoid close contact with people who are sick. . Avoid places or events with large numbers of people in one location, like concerts or sporting events. . Carefully consider travel plans you have or are making. . If you are planning any travel outside or inside the [place], visit the CDC's Travelers' Health webpage for the latest health notices. . If you have some symptoms but not all symptoms, continue to monitor at home and seek medical attention if your symptoms worsen. . If you are having a medical emergency, call 911.   ADDITIONAL HEALTHCARE OPTIONS FOR PATIENTS  [facility] / e-Visit: [email or web address]         [facility]: [phone]  [place]   [facility]: [phone]                   [facility]: [phone]   

## 2019-09-27 MED FILL — ZYTIGA 250 MG TABLET: 250 | 30 days supply | Qty: 120 | Fill #5

## 2019-09-27 MED FILL — oxyCODONE HCL 10 MG TABS: 10 | 30 days supply | Qty: 360 | Fill #0

## 2019-10-08 ENCOUNTER — Other Ambulatory Visit: Payer: Self-pay | Admitting: Radiation Oncology

## 2019-10-08 MED ORDER — OXYCODONE HCL ER 20 MG PO T12A: 20.0000 mg | EXTENDED_RELEASE_TABLET | Freq: Two times a day (BID) | ORAL | 0 refills | Status: DC

## 2019-10-08 MED FILL — OxyCONTIN 20 MG T12A: 20 | 30 days supply | Qty: 60 | Fill #0

## 2019-10-21 ENCOUNTER — Other Ambulatory Visit: Payer: Self-pay | Admitting: Radiation Oncology

## 2019-10-21 MED ORDER — OXYCODONE HCL 10 MG PO TABS: 20.0000 mg | ORAL_TABLET | Freq: Every day | ORAL | 0 refills | Status: DC

## 2019-10-25 MED FILL — oxyCODONE HCL 10 MG TABS: 10 | 30 days supply | Qty: 360 | Fill #0

## 2019-10-25 MED FILL — ZYTIGA 250 MG TABLET: 250 | 30 days supply | Qty: 120 | Fill #6

## 2019-11-05 ENCOUNTER — Other Ambulatory Visit: Payer: Self-pay | Admitting: Radiation Oncology

## 2019-11-05 MED ORDER — OXYCODONE HCL ER 20 MG PO T12A: 20.0000 mg | EXTENDED_RELEASE_TABLET | Freq: Two times a day (BID) | ORAL | 0 refills | Status: DC

## 2019-11-19 ENCOUNTER — Other Ambulatory Visit: Payer: Self-pay | Admitting: Radiation Oncology

## 2019-11-19 MED ORDER — OXYCODONE HCL 10 MG PO TABS: 20.0000 mg | ORAL_TABLET | Freq: Every day | ORAL | 0 refills | Status: DC

## 2019-11-20 ENCOUNTER — Telehealth: Payer: Self-pay

## 2019-11-22 MED FILL — oxyCODONE HCL 10 MG TABS: 10 | 30 days supply | Qty: 360 | Fill #0

## 2019-11-23 MED FILL — ZYTIGA 250 MG TABLET: 250 | 30 days supply | Qty: 120 | Fill #7

## 2019-11-23 NOTE — Telephone Encounter (Signed)
  11/20/2019 01:02 PM Phone (Outgoing) Jonathan Campbell, Jonathan Campbell (Self) [phone] (H)   Patient called and advised Dr. Sondra Come has called in his med refill for his pain medicine.    By De Burrs, RN

## 2019-12-03 ENCOUNTER — Other Ambulatory Visit: Payer: Self-pay | Admitting: Radiation Oncology

## 2019-12-03 ENCOUNTER — Telehealth: Payer: Self-pay

## 2019-12-03 MED ORDER — OXYCODONE HCL ER 20 MG PO T12A: 20.0000 mg | EXTENDED_RELEASE_TABLET | Freq: Two times a day (BID) | ORAL | 0 refills | Status: DC

## 2019-12-03 NOTE — Telephone Encounter (Signed)
Patient called for his refill for his pain medicine. RN called patient and advised him that his Oxycontin 20 mg extended release request was given to Dr. Sondra Come.

## 2019-12-05 ENCOUNTER — Other Ambulatory Visit: Payer: Self-pay

## 2019-12-05 DIAGNOSIS — C61 Malignant neoplasm of prostate: Secondary | ICD-10-CM

## 2019-12-05 DIAGNOSIS — C7951 Secondary malignant neoplasm of bone: Secondary | ICD-10-CM

## 2019-12-06 ENCOUNTER — Telehealth: Payer: Self-pay | Admitting: Oncology

## 2019-12-06 ENCOUNTER — Inpatient Hospital Stay (HOSPITAL_BASED_OUTPATIENT_CLINIC_OR_DEPARTMENT_OTHER): Payer: Medicaid Other | Admitting: Oncology

## 2019-12-06 ENCOUNTER — Inpatient Hospital Stay: Payer: Medicaid Other | Attending: Oncology

## 2019-12-06 ENCOUNTER — Inpatient Hospital Stay: Payer: Medicaid Other

## 2019-12-06 ENCOUNTER — Other Ambulatory Visit: Payer: Self-pay

## 2019-12-06 ENCOUNTER — Telehealth: Payer: Self-pay | Admitting: Pharmacist

## 2019-12-06 VITALS — BP 151/92 | HR 58 | Temp 97.5°F | Resp 20 | Wt 182.5 lb

## 2019-12-06 DIAGNOSIS — Z79818 Long term (current) use of other agents affecting estrogen receptors and estrogen levels: Secondary | ICD-10-CM | POA: Insufficient documentation

## 2019-12-06 DIAGNOSIS — C7951 Secondary malignant neoplasm of bone: Secondary | ICD-10-CM | POA: Insufficient documentation

## 2019-12-06 DIAGNOSIS — C61 Malignant neoplasm of prostate: Secondary | ICD-10-CM

## 2019-12-06 DIAGNOSIS — Z79899 Other long term (current) drug therapy: Secondary | ICD-10-CM | POA: Diagnosis not present

## 2019-12-06 LAB — CBC WITH DIFFERENTIAL (CANCER CENTER ONLY)
Abs Immature Granulocytes: 0.1 10*3/uL — ABNORMAL HIGH (ref 0.00–0.07)
Basophils Absolute: 0.1 10*3/uL (ref 0.0–0.1)
Basophils Relative: 1 %
Eosinophils Absolute: 0.6 10*3/uL — ABNORMAL HIGH (ref 0.0–0.5)
Eosinophils Relative: 7 %
HCT: 42.2 % (ref 39.0–52.0)
Hemoglobin: 14.7 g/dL (ref 13.0–17.0)
Immature Granulocytes: 1 %
Lymphocytes Relative: 22 %
Lymphs Abs: 2 10*3/uL (ref 0.7–4.0)
MCH: 29.1 pg (ref 26.0–34.0)
MCHC: 34.8 g/dL (ref 30.0–36.0)
MCV: 83.4 fL (ref 80.0–100.0)
Monocytes Absolute: 0.6 10*3/uL (ref 0.1–1.0)
Monocytes Relative: 7 %
Neutro Abs: 5.5 10*3/uL (ref 1.7–7.7)
Neutrophils Relative %: 62 %
Platelet Count: 324 10*3/uL (ref 150–400)
RBC: 5.06 MIL/uL (ref 4.22–5.81)
RDW: 13.7 % (ref 11.5–15.5)
WBC Count: 8.9 10*3/uL (ref 4.0–10.5)
nRBC: 0 % (ref 0.0–0.2)

## 2019-12-06 LAB — CMP (CANCER CENTER ONLY)
ALT: 20 U/L (ref 0–44)
AST: 23 U/L (ref 15–41)
Albumin: 3.3 g/dL — ABNORMAL LOW (ref 3.5–5.0)
Alkaline Phosphatase: 217 U/L — ABNORMAL HIGH (ref 38–126)
Anion gap: 7 (ref 5–15)
BUN: 16 mg/dL (ref 6–20)
CO2: 21 mmol/L — ABNORMAL LOW (ref 22–32)
Calcium: 9.2 mg/dL (ref 8.9–10.3)
Chloride: 109 mmol/L (ref 98–111)
Creatinine: 1.31 mg/dL — ABNORMAL HIGH (ref 0.61–1.24)
GFR, Est AFR Am: >60 mL/min (ref >60)
GFR, Estimated: 60 mL/min — ABNORMAL LOW (ref >60)
Glucose, Bld: 104 mg/dL — ABNORMAL HIGH (ref 70–99)
Potassium: 4.3 mmol/L (ref 3.5–5.1)
Sodium: 137 mmol/L (ref 135–145)
Total Bilirubin: 0.3 mg/dL (ref 0.3–1.2)
Total Protein: 8.1 g/dL (ref 6.5–8.1)

## 2019-12-06 MED ORDER — LEUPROLIDE ACETATE (4 MONTH) 30 MG ~~LOC~~ KIT
30.0000 mg | PACK | Freq: Once | SUBCUTANEOUS | Status: AC
  Administered 2019-12-06: 30 mg via SUBCUTANEOUS

## 2019-12-06 MED ORDER — ENZALUTAMIDE 80 MG PO TABS: 160.0000 mg | ORAL_TABLET | Freq: Every day | ORAL | 0 refills | Status: DC

## 2019-12-06 MED ORDER — LEUPROLIDE ACETATE (4 MONTH) 30 MG ~~LOC~~ KIT
PACK | SUBCUTANEOUS | Status: AC
  Filled 2019-12-06: qty 30

## 2019-12-06 NOTE — Progress Notes (Signed)
Hematology and Oncology Follow Up Visit  Lamark Schue [id number] Aug 04, 1960 59 y.o. 12/06/2019 10:15 AM Patient, No Pcp PerNo ref. 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  He presented with PSA of 819 and advanced disease at the time of diagnosis.   Prior Therapy:  Radiation therapy as follows:  07/26/2018 - 08/08/2018: Rightsuperior glenoid of right shoulder / 30 Gy in 10 fractions of 3 Gy  12/01/2017 - 12/14/2017: 1. Left Scapula / 30 Gy in 10 fractions 2. Lumbar Spine (L4 region) / 30 Gy in 10 fractions  09/16/2017 - 09/29/2017: T Spine / 30 Gy in 10 fractions  He completed 4 weeks of Casodex between April and May 2019.  Current therapy:  Lupron 30 mg given starting on 10/05/2017.  This will be repeated every 4 months.  Zytiga 1000 mg daily started in May 2019.  Interim History: Mr. Capili is here for a return evaluation.  Since the last visit, he reports no major changes in his health.  He denies any recent hospitalization or illnesses.  He denies any worsening bone pain or back pain.  He continues to tolerate Zytiga without any recent side effects including fatigue, tiredness or edema.  His quality of life and performance status remained excellent.       Medications: Updated on review. Current Outpatient Medications  Medication Sig Dispense Refill  . acetaminophen (TYLENOL) 325 MG tablet Take 2 tablets (650 mg total) by mouth every 6 (six) hours as needed for mild pain. 120 tablet 0  . oxyCODONE (OXYCONTIN) 20 mg 12 hr tablet Take 1 tablet (20 mg total) by mouth every 12 (twelve) hours. 60 tablet 0  . Oxycodone HCl 10 MG TABS Take 2 tablets (20 mg total) by mouth 6 (six) times daily. 360 tablet 0  . polyethylene glycol (MIRALAX / GLYCOLAX) packet Take 17 g by mouth daily. Use OTC Clearax 17 g po daily 30 each 2  . ranitidine (ZANTAC) 300 MG tablet Take 1 tablet  (300 mg total) by mouth at bedtime. 30 tablet 1  . ZYTIGA 250 MG tablet Take 4 tablets (1,000 mg total) by mouth daily. Take on an empty stomach 1 hour before or 2 hours after a meal 120 tablet 11   No current facility-administered medications for this visit.     Allergies: No Known Allergies     Physical Exam:   Blood pressure (!) 151/92, pulse (!) 58, temperature (!) 97.5 F (36.4 C), temperature source Temporal, resp. rate 20, weight 182 lb 8 oz (82.8 kg), SpO2 100 %.      ECOG: 1    General appearance: Alert, awake without any distress. Head: Atraumatic without abnormalities Oropharynx: Without any thrush or ulcers. Eyes: No scleral icterus. Lymph nodes: No lymphadenopathy noted in the cervical, supraclavicular, or axillary nodes Heart:regular rate and rhythm, without any murmurs or gallops.   Lung: Clear to auscultation without any rhonchi, wheezes or dullness to percussion. Abdomin: Soft, nontender without any shifting dullness or ascites. Musculoskeletal: No clubbing or cyanosis. Neurological: No motor or sensory deficits. Skin: No rashes or lesions.           Lab Results: Lab Results  Component Value Date   WBC 8.6 08/08/2019   HGB 14.9 08/08/2019   HCT 42.6 08/08/2019   MCV 83.9 08/08/2019   PLT 277 08/08/2019     Chemistry      Component Value Date/Time   NA  143 08/08/2019 1033   K 4.3 08/08/2019 1033   CL 106 08/08/2019 1033   CO2 25 08/08/2019 1033   BUN 16 08/08/2019 1033   CREATININE 1.23 08/08/2019 1033      Component Value Date/Time   CALCIUM 9.2 08/08/2019 1033   ALKPHOS 212 (H) 08/08/2019 1033   AST 22 08/08/2019 1033   ALT 19 08/08/2019 1033   BILITOT 0.7 08/08/2019 1033      Results for BARRIE, WALE (MRN [id number]) as of 12/06/2019 10:16  Ref. Range 10/27/2018 12:36 03/20/2019 09:21 08/08/2019 10:33  Prostate Specific Ag, Serum Latest Ref Range: 0.0 - 4.0 ng/mL 6.8 (H) 7.6 (H) 26.7 (H)    IMPRESSION: 1. Near complete  resolution of bilateral iliac and abdominal retroperitoneal lymphadenopathy since previous study. Largest remaining lymph node in left external iliac chain measures 7 mm, and is not considered pathologically enlarged. 2. New 1.2 cm sclerotic lesion in the left ischial tuberosity and sub-cm sclerotic focus in the L5 vertebral body, consistent with bone metastases.  IMPRESSION: Osseous metastasis at L2 vertebral body.  No other new worrisome scintigraphic abnormalities.     Impression and Plan:  59 year old man with  1.  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  He presented with advanced disease at the time of diagnosis.  He is currently on Zytiga with his PSA has been rising documented in February 2021.  His PSA increased to 26.7.  Staging work-up at that time in March 2021 including CT scan and bone scan were personally reviewed and discussed with the patient.  Is scans did not show any radiographic progression of disease.  Treatment options were reviewed at this time which includes switching to [facility] versus systemic chemotherapy on other options.  Complications associated with Xtandi include nausea, fatigue, hypertension and edema.  Given the fact that he has biochemical relapse only without any visceral metastasis, will defer chemotherapy option for the time being.  He is agreeable to switch to Xtandi at 160 mg daily.  2.  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   3.  Bone directed therapy: I recommended calcium and vitamin D supplements.  Delton See has been deferred for the time being.  4.  Androgen deprivation: He is currently on Eligard and will be repeated every 4 months.  Complication associated with this medication include weight gain, hot flashes among others were reviewed.  5.  Prognosis and goals of care: Therapy remains palliative although aggressive  measures are warranted given his excellent performance status.  6.  Follow-up: In 2 months for repeat evaluation.  30 minutes were spent on this encounter.  The time was spent on updating his laboratory data, imaging study review, treatment options and future plan of care discussion.  Zola Button, MD 6/10/202110:15 AM

## 2019-12-06 NOTE — Patient Instructions (Signed)

## 2019-12-06 NOTE — Telephone Encounter (Signed)
Oral Oncology Pharmacist Encounter  Received new prescription for Xtandi (enzalutamide) for the treatment of castrate resistant prostate cancer in conjunction with Lupron, planned duration until disease progression or unacceptable drug toxicity.  Prescription dose and frequency assessed.   Current medication list in Epic reviewed, one DDIs with enzalutamide identified: - Xtandi may decrease the serum concentration of oxyconde. Monitor patient for decreased effectiveness of oxycodone. No baseline dose adjustment needed.   Prescription has been e-scribed to the [facility] for benefits analysis and approval.  Oral Oncology Clinic will continue to follow for insurance authorization, copayment issues, initial counseling and start date.  [name], PharmD, BCPS, BCOP, CPP Hematology/Oncology Clinical Pharmacist Practitioner [facility] [phone]  12/06/2019 11:22 AM

## 2019-12-06 NOTE — Telephone Encounter (Signed)
Oral Chemotherapy Pharmacist Encounter  Jonathan Campbell plans on picking up his medication from [place] tomorrow. He knows to get started when her receives his medication.  Patient Education I spoke with patient for overview of new oral chemotherapy medication: Xtandi (enzalutamide) for the treatment of castrate resistant prostate cancer in conjunction with Lupron, planned duration until disease progression or unacceptable drug toxicity.   Pt is doing well. Counseled patient on administration, dosing, side effects, monitoring, drug-food interactions, safe handling, storage, and disposal. Patient will take 2 tablets (160 mg total) by mouth daily.  Side effects include but not limited to: fatigue, hot flashes, seizure risk.    Reviewed with patient importance of keeping a medication schedule and plan for any missed doses.  Jonathan Campbell voiced understanding and appreciation. All questions answered. Medication handout placed in the mail.  Provided patient with [facility] phone number. Patient knows to call the office with questions or concerns. Oral Chemotherapy Navigation Clinic will continue to follow.  [name], PharmD, BCPS, BCOP, CPP Hematology/Oncology Clinical Pharmacist Practitioner [facility] [phone]  12/06/2019 4:10 PM

## 2019-12-06 NOTE — Telephone Encounter (Signed)
Scheduled appt per 6/10 los.  Spoke with pt and he is aware of the appt date and time.

## 2019-12-07 ENCOUNTER — Telehealth: Payer: Self-pay

## 2019-12-07 LAB — PROSTATE-SPECIFIC AG, SERUM (LABCORP): Prostate Specific Ag, Serum: 23.5 ng/mL — ABNORMAL HIGH (ref 0.0–4.0)

## 2019-12-07 NOTE — Telephone Encounter (Signed)
Called patient and made him aware of PSA results. Patient verbalized understanding.  °

## 2019-12-07 NOTE — Telephone Encounter (Signed)
-----   Message from Wyatt Portela, MD sent at 12/07/2019  8:49 AM EDT ----- Please let him know his PSA is down

## 2019-12-19 ENCOUNTER — Other Ambulatory Visit: Payer: Self-pay | Admitting: Radiation Oncology

## 2019-12-19 ENCOUNTER — Telehealth: Payer: Self-pay

## 2019-12-19 DIAGNOSIS — C801 Malignant (primary) neoplasm, unspecified: Secondary | ICD-10-CM

## 2019-12-19 DIAGNOSIS — C7989 Secondary malignant neoplasm of other specified sites: Secondary | ICD-10-CM

## 2019-12-19 MED ORDER — OXYCODONE HCL 10 MG PO TABS: 20.0000 mg | ORAL_TABLET | Freq: Every day | ORAL | 0 refills | Status: DC

## 2019-12-19 MED FILL — oxyCODONE HCL 10 MG TABS: 10 | 8 days supply | Qty: 90 | Fill #0

## 2019-12-19 NOTE — Telephone Encounter (Signed)
Patient called after leaving a  VM request for a RF of his Oxycodone. Advised Dr. Sondra Come is out of town and will ask his covering MD to refill all or part of his prescription. Pt. Advised to give the MD a minimum of 24 hours to refill his med. He states he needs the refill by 6/26.

## 2019-12-19 NOTE — Telephone Encounter (Signed)
Patient called and advised per Dr. Isidore Moos that his pain med has been refilled for 8 days and after that Dr. Sondra Come can refill the balance for the month.

## 2019-12-24 ENCOUNTER — Telehealth: Payer: Self-pay

## 2019-12-24 ENCOUNTER — Other Ambulatory Visit: Payer: Self-pay | Admitting: Radiation Oncology

## 2019-12-24 ENCOUNTER — Telehealth: Payer: Self-pay | Admitting: Radiation Oncology

## 2019-12-24 DIAGNOSIS — C801 Malignant (primary) neoplasm, unspecified: Secondary | ICD-10-CM

## 2019-12-24 DIAGNOSIS — C7989 Secondary malignant neoplasm of other specified sites: Secondary | ICD-10-CM

## 2019-12-24 MED ORDER — OXYCODONE HCL 10 MG PO TABS: 20.0000 mg | ORAL_TABLET | Freq: Every day | ORAL | 0 refills | Status: DC

## 2019-12-24 MED FILL — oxyCODONE HCL 10 MG TABS: 10 | 13 days supply | Qty: 170 | Fill #0

## 2019-12-24 NOTE — Telephone Encounter (Signed)
Per Barbra Sarks, RN, patient would like his radiation records. I spoke with patient and he is authorizing his significant other, [name], to pick up records.

## 2019-12-24 NOTE — Telephone Encounter (Signed)
Patient called requesting a refill of his Oxycodone 10 mg to [facility] for the balance of his meds. Patient is also requesting his medical records from his radiation treatments and clerical requested to call him to assist him with this.

## 2019-12-26 ENCOUNTER — Other Ambulatory Visit: Payer: Self-pay | Admitting: Oncology

## 2020-01-01 ENCOUNTER — Other Ambulatory Visit: Payer: Self-pay | Admitting: Radiation Oncology

## 2020-01-01 ENCOUNTER — Telehealth: Payer: Self-pay

## 2020-01-01 MED ORDER — OXYCODONE HCL ER 20 MG PO T12A: 20.0000 mg | EXTENDED_RELEASE_TABLET | Freq: Two times a day (BID) | ORAL | 0 refills | Status: DC

## 2020-01-01 MED FILL — OxyCONTIN 20 MG T12A: 20 | 30 days supply | Qty: 60 | Fill #0

## 2020-01-01 NOTE — Telephone Encounter (Signed)
Patient called for a refill of  His Oxycontin 20 mg q 12 hour pain medicine. He states he is out and needs it called to [facility]. Dr. Sondra Come notified.

## 2020-01-02 ENCOUNTER — Telehealth: Payer: Self-pay

## 2020-01-02 MED FILL — XTANDI 80 MG TABS: 80 | 30 days supply | Qty: 60 | Fill #0

## 2020-01-02 NOTE — Telephone Encounter (Signed)
Patient came by the clinic on 01/01/2020 to explain that he has aright leg that is getting weaker and more shaky. He has to have help getting up at night to go to the bathroom. He wants to know if he needs to move his appt. Up with Dr. Sondra Come. No upcoming appts in the computer with Dr. Sondra Come. He also wants to remind Dr. Sondra Come that on July 12 th he will need a refill of Oxycontin 10 mg # 360 tablets sent to [facility]. Patient advised Dr. Sondra Come will be made aware of these issues.

## 2020-01-03 ENCOUNTER — Telehealth: Payer: Self-pay

## 2020-01-03 ENCOUNTER — Other Ambulatory Visit: Payer: Self-pay | Admitting: Radiation Oncology

## 2020-01-03 DIAGNOSIS — C7989 Secondary malignant neoplasm of other specified sites: Secondary | ICD-10-CM

## 2020-01-03 DIAGNOSIS — C801 Malignant (primary) neoplasm, unspecified: Secondary | ICD-10-CM

## 2020-01-03 MED ORDER — OXYCODONE HCL 10 MG PO TABS: 20.0000 mg | ORAL_TABLET | Freq: Every day | ORAL | 0 refills | Status: DC

## 2020-01-03 NOTE — Telephone Encounter (Signed)
Patient called to remind Dr. Sondra Come he needs his Oxycodone 10 mg refilled on 7/12 and that his right leg is becoming more weak and it trembles. Patient advised Dr. Sondra Come is aware of his pain med refill request and this RN is waiting to hear if Dr. Sondra Come thinks his appointment date should be moved up. Patient reminded every request that he makes is given to Dr. Sondra Come and he needs to give 24- 48 hours for him to complete his request. Patient verbalized understanding.

## 2020-01-04 ENCOUNTER — Telehealth: Payer: Self-pay | Admitting: *Deleted

## 2020-01-04 NOTE — Telephone Encounter (Signed)
CALLED PATIENT TO INFORM OF FU WITH DR. KINARD ON 01/17/20 @ 4 PM, SPOKE WITH PATIENT AND HE IS AWARE OF THIS APPT.

## 2020-01-07 MED FILL — oxyCODONE HCL 10 MG TABS: 10 | 30 days supply | Qty: 360 | Fill #0

## 2020-01-08 ENCOUNTER — Telehealth: Payer: Self-pay | Admitting: Radiation Oncology

## 2020-01-08 NOTE — Telephone Encounter (Signed)
Phoned patient as requested by Dr. Sondra Come. Explained that per Dr. Sondra Come he sent a refill of oxycodone count 360 in on 01/03/2020 to [facility] outpatient pharmacy. Patient explains he picked up the medication yesterday but had to pay out of pocket in excess of $60. Patient explained his understanding that medicaid is requiring an authorization. This RN committed to calling [facility] to inquire further to ensure this doesn't become an issue next time he needs a refill. Patient verbalized appreciation for the assistance and call back.

## 2020-01-09 ENCOUNTER — Encounter: Payer: Self-pay | Admitting: Radiation Oncology

## 2020-01-09 NOTE — Progress Notes (Signed)
Lattie Haw, RN for Dr. Sondra Come returned to work today. Discussed events that occurred yesterday. Lattie Haw, RN reports submitting the prior authorization mentioned today. Handing this issue over to [place], RN at this point to manage moving forward.

## 2020-01-16 NOTE — Progress Notes (Signed)
Radiation Oncology         [phone] ________________________________  Name: Jonathan Campbell MRN: [id number]  Date: 01/17/2020  DOB: [birth year]  Follow-Up Visit Note  CC: Patient, No Pcp Per  Domenic Polite, MD    ICD-10-CM   1. Prostate cancer metastatic to bone [facility])  C61 MR THORACIC SPINE W WO CONTRAST   C79.51   2. 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  07/26/2018 - 08/08/2018: Rightsuperior glenoid of right shoulder / 30 Gy in 10 fractions of 3 Gy  12/01/2017 - 12/14/2017: 1. Left Scapula / 30 Gy in 10 fractions 2. Lumbar Spine (L4 region) / 30 Gy in 10 fractions  09/16/2017 - 09/29/2017: T Spine / 30 Gy in 10 fractions  Narrative: The patient returns today for unscheduled follow-up. He was last seen on 09/24/2019 and was scheduled to follow-up in six months. However, he began complaining of right leg weakness and tremors on 01/01/2020, for which his appointment was moved up.  The patient has also been complaining of increasing pain in the upper mid back region.  He denies any numbness in his lower extremities or problems such as urinary or bowel incontinence constipation issues.    Of note, the patient last followed-up with Dr. Alen Blew on 12/06/2019. He continues on Zytiga and is tolerating it well.    ALLERGIES:  has No Known Allergies.  Meds: Current Outpatient Medications  Medication Sig Dispense Refill  . acetaminophen (TYLENOL) 325 MG tablet Take 2 tablets (650 mg total) by mouth every 6 (six) hours as needed for mild pain. 120 tablet 0  . oxyCODONE (OXYCONTIN) 20 mg 12 hr tablet Take 1 tablet (20 mg total) by mouth every 12 (twelve) hours. 60 tablet 0  . Oxycodone HCl 10 MG TABS Take 2 tablets (20 mg total) by mouth 6 (six) times daily. PRN Pain. 360 tablet 0  . polyethylene  glycol (MIRALAX / GLYCOLAX) packet Take 17 g by mouth daily. Use OTC Clearax 17 g po daily 30 each 2  . ranitidine (ZANTAC) 300 MG tablet Take 1 tablet (300 mg total) by mouth at bedtime. 30 tablet 1  . XTANDI 80 MG tablet TAKE 2 TABLETS (160 MG TOTAL) BY MOUTH DAILY. 60 tablet 0   No current facility-administered medications for this encounter.    Physical Findings: The patient is in no acute distress. Patient is alert and oriented.  height is 5\' 9"  (1.753 m) and weight is 183 lb 9.6 oz (83.3 kg). His temperature is 98.4 F (36.9 C). His blood pressure is 155/98 (abnormal) and his pulse is 99. His respiration is 20 and oxygen saturation is 100%.  Lungs are clear to auscultation bilaterally. Heart has regular rate and rhythm. No palpable cervical, supraclavicular, or axillary adenopathy. Abdomen soft, non-tender, normal bowel sounds.  He continues to wear a back brace.  On neurological examination motor strength is 5 out of 5 in the proximal and distal muscle groups of the upper and lower extremities.  Palpation along the upper thoracic spine area reveals no point tenderness Lab Findings: Lab Results  Component Value Date   WBC 8.9 12/06/2019   HGB 14.7 12/06/2019   HCT 42.2 12/06/2019   MCV 83.4 12/06/2019   PLT 324 12/06/2019    Radiographic Findings: No results found.  Impression: Prostate cancer metastatic to bone  Clinically stable at this time. He continues to have chronic pain requiring narcotics. Oxycodone last refilled on 01/03/2020.  As above the patient is having increasing pain in the thoracic spine. It has  been over 2 years since he has had treatment to this region may possibly be having progression at this time.  Plan: The patient will follow up with Dr. Alen Blew on 02/01/2020.  Patient will be scheduled for an MRI of the thoracic spine in the near future.  Intervention as needed, otherwise, routine follow-up in radiation oncology in six months.  Total time spent in this  encounter was 15 minutes which included reviewing the patient's most recent interval history, follow-ups, physical examination, and documentation and ordering of upcoming thoracic MRI.  -----------------------------------  Blair Promise, PhD, MD  This document serves as a record of services personally performed by Gery Pray, MD. It was created on his behalf by Clerance Lav, a trained medical scribe. The creation of this record is based on the scribe's personal observations and the provider's statements to them. This document has been checked and approved by the attending provider.

## 2020-01-17 ENCOUNTER — Other Ambulatory Visit: Payer: Self-pay

## 2020-01-17 ENCOUNTER — Encounter: Payer: Self-pay | Admitting: Radiation Oncology

## 2020-01-17 ENCOUNTER — Ambulatory Visit
Admission: RE | Admit: 2020-01-17 | Discharge: 2020-01-17 | Disposition: A | Discharge status: Home / Self Care | Payer: Medicaid Other | Source: Ambulatory Visit | Attending: Radiation Oncology | Admitting: Radiation Oncology

## 2020-01-17 VITALS — BP 155/98 | HR 99 | Temp 98.4°F | Resp 20 | Ht 69.0 in | Wt 183.6 lb

## 2020-01-17 DIAGNOSIS — Z79899 Other long term (current) drug therapy: Secondary | ICD-10-CM | POA: Diagnosis not present

## 2020-01-17 DIAGNOSIS — C61 Malignant neoplasm of prostate: Secondary | ICD-10-CM | POA: Diagnosis not present

## 2020-01-17 DIAGNOSIS — C801 Malignant (primary) neoplasm, unspecified: Secondary | ICD-10-CM

## 2020-01-17 DIAGNOSIS — M546 Pain in thoracic spine: Secondary | ICD-10-CM | POA: Diagnosis not present

## 2020-01-17 DIAGNOSIS — G8929 Other chronic pain: Secondary | ICD-10-CM | POA: Insufficient documentation

## 2020-01-17 DIAGNOSIS — C7989 Secondary malignant neoplasm of other specified sites: Secondary | ICD-10-CM | POA: Diagnosis not present

## 2020-01-17 DIAGNOSIS — C7951 Secondary malignant neoplasm of bone: Secondary | ICD-10-CM | POA: Insufficient documentation

## 2020-01-17 NOTE — Progress Notes (Signed)
Patient here for a f/u visit with Dr. Sondra Come. He reports having pain that rates a "6-7" in his upper back and bil legs. He states his fiance has to assist him to the bathroom when he takes his back brace off. Reports a good appetite and staying hydrated.  Patient reports he gets relief from his pain meds.  BP (!) 155/98 (BP Location: Right Arm, Patient Position: Sitting, Cuff Size: Normal)   Pulse 99   Temp 98.4 F (36.9 C)   Resp 20   Ht 5\' 9"  (1.753 m)   Wt 183 lb 9.6 oz (83.3 kg)   SpO2 100%   BMI 27.11 kg/m   Wt Readings from Last 3 Encounters:  01/17/20 183 lb 9.6 oz (83.3 kg)  12/06/19 182 lb 8 oz (82.8 kg)  09/24/19 186 lb 4 oz (84.5 kg)

## 2020-01-24 ENCOUNTER — Telehealth: Payer: Self-pay

## 2020-01-24 ENCOUNTER — Other Ambulatory Visit: Payer: Self-pay | Admitting: Radiation Oncology

## 2020-01-24 MED ORDER — OXYCODONE HCL ER 20 MG PO T12A: 20.0000 mg | EXTENDED_RELEASE_TABLET | Freq: Two times a day (BID) | ORAL | 0 refills | Status: DC

## 2020-01-24 NOTE — Telephone Encounter (Signed)
Returned patient's call for his request to refill his Oycodone 20 mg To [facility] so it will be available next week. Patient advised his request has been given to Dr. Sondra Come.

## 2020-01-25 ENCOUNTER — Telehealth: Payer: Self-pay | Admitting: Oncology

## 2020-01-25 NOTE — Telephone Encounter (Signed)
[id number] PPGFQM medical records to pt's attorney(Rhona Higchens) per pt's request @ fax [phone] and provided copy to pt

## 2020-01-29 ENCOUNTER — Telehealth: Payer: Self-pay | Admitting: Radiation Oncology

## 2020-01-29 NOTE — Telephone Encounter (Signed)
Received voicemail message from patient upset because "the pharmacy is telling me that prior authorization still hasn't been completed for my oxycontin." Patient was of the understanding Barbra Sarks, RN had already completed this. Phoned patient back. Explained after receiving his voicemail message this RN submitted a prior authorization via covermymeds [place]: [facility] Case ID: [id number].) Apologized for any inconvenience this had created. Patient verbalized understanding and appreciation for the assistance.

## 2020-01-30 ENCOUNTER — Ambulatory Visit (HOSPITAL_COMMUNITY): Admission: RE | Admit: 2020-01-30 | Payer: Medicaid Other | Source: Ambulatory Visit

## 2020-01-30 MED FILL — OxyCONTIN 20 MG T12A: 20 | 30 days supply | Qty: 60 | Fill #0

## 2020-02-01 ENCOUNTER — Inpatient Hospital Stay: Payer: Medicaid Other

## 2020-02-01 ENCOUNTER — Inpatient Hospital Stay: Payer: Medicaid Other | Admitting: Oncology

## 2020-02-04 ENCOUNTER — Other Ambulatory Visit: Payer: Self-pay | Admitting: Radiation Oncology

## 2020-02-04 ENCOUNTER — Telehealth: Payer: Self-pay

## 2020-02-04 DIAGNOSIS — C7989 Secondary malignant neoplasm of other specified sites: Secondary | ICD-10-CM

## 2020-02-04 DIAGNOSIS — C801 Malignant (primary) neoplasm, unspecified: Secondary | ICD-10-CM

## 2020-02-04 MED ORDER — OXYCODONE HCL 10 MG PO TABS: 20.0000 mg | ORAL_TABLET | Freq: Every day | ORAL | 0 refills | Status: DC

## 2020-02-04 MED FILL — oxyCODONE HCL 10 MG TABS: 10 | 30 days supply | Qty: 360 | Fill #0

## 2020-02-04 NOTE — Telephone Encounter (Signed)
Patient called for a refill on his Oxycodone 10 mg that he needs to be refilled today. Dr. Sondra Come advised of this and the patient was advised his request was rec'd.

## 2020-02-06 ENCOUNTER — Other Ambulatory Visit: Payer: Self-pay | Admitting: Oncology

## 2020-02-06 ENCOUNTER — Other Ambulatory Visit: Payer: Self-pay | Admitting: Radiation Oncology

## 2020-02-06 ENCOUNTER — Other Ambulatory Visit: Payer: Self-pay

## 2020-02-06 ENCOUNTER — Ambulatory Visit (HOSPITAL_COMMUNITY)
Admission: RE | Admit: 2020-02-06 | Discharge: 2020-02-06 | Disposition: A | Discharge status: Home / Self Care | Payer: Medicaid Other | Source: Ambulatory Visit | Attending: Radiation Oncology | Admitting: Radiation Oncology

## 2020-02-06 DIAGNOSIS — C61 Malignant neoplasm of prostate: Secondary | ICD-10-CM

## 2020-02-06 DIAGNOSIS — C7951 Secondary malignant neoplasm of bone: Secondary | ICD-10-CM | POA: Diagnosis present

## 2020-02-06 MED ORDER — GADOBUTROL 1 MMOL/ML IV SOLN: 8.0000 mL | Freq: Once | INTRAVENOUS | Status: DC | PRN

## 2020-02-06 MED FILL — XTANDI 80 MG TABS: 80 | 30 days supply | Qty: 60 | Fill #0

## 2020-02-06 NOTE — Progress Notes (Signed)
Pt completed MRI thoracic spine without contrast. Pt in a lot of pain and stated he didn't feel good today. Pt unable to withstand post contrast imaging.

## 2020-02-07 ENCOUNTER — Inpatient Hospital Stay: Payer: Medicaid Other | Admitting: Oncology

## 2020-02-07 ENCOUNTER — Telehealth: Payer: Self-pay

## 2020-02-07 ENCOUNTER — Telehealth: Payer: Self-pay | Admitting: Oncology

## 2020-02-07 ENCOUNTER — Inpatient Hospital Stay: Payer: Medicaid Other

## 2020-02-07 NOTE — Telephone Encounter (Signed)
Scheduled appointments per 8/12 scheduling message. Patient is aware of updated appointment date and times.

## 2020-02-07 NOTE — Telephone Encounter (Signed)
TC to pt to check on him since I noticed that he was running late for his appointments this morning. Spoke with pt who stated that his ride did not pick him up so he would need to come in to day at 12 or after. Let pt know that Dr Alen Blew was booked for the rest of the day so he would just need to reschedule.pt verbalized understanding. Sent schedule message to schedulers to get patients missed appointment today rescheduled to another day. Message sent!

## 2020-02-15 ENCOUNTER — Telehealth: Payer: Self-pay

## 2020-02-15 NOTE — Telephone Encounter (Signed)
I was notified by [facility] that Jonathan Campbell never picked up his [facility]y that was refilled 02/06/20, so they have returned it to stock.  [facility] Patient [place] Phone [phone] Fax [phone] 02/15/2020 10:46 AM

## 2020-02-20 ENCOUNTER — Inpatient Hospital Stay (HOSPITAL_BASED_OUTPATIENT_CLINIC_OR_DEPARTMENT_OTHER): Payer: Medicaid Other | Admitting: Oncology

## 2020-02-20 ENCOUNTER — Other Ambulatory Visit: Payer: Self-pay

## 2020-02-20 ENCOUNTER — Inpatient Hospital Stay: Payer: Medicaid Other | Attending: Oncology

## 2020-02-20 VITALS — BP 130/80 | HR 75 | Temp 98.1°F | Resp 18 | Wt 182.9 lb

## 2020-02-20 DIAGNOSIS — I10 Essential (primary) hypertension: Secondary | ICD-10-CM | POA: Insufficient documentation

## 2020-02-20 DIAGNOSIS — C7951 Secondary malignant neoplasm of bone: Secondary | ICD-10-CM | POA: Insufficient documentation

## 2020-02-20 DIAGNOSIS — C61 Malignant neoplasm of prostate: Secondary | ICD-10-CM | POA: Diagnosis not present

## 2020-02-20 DIAGNOSIS — Z79899 Other long term (current) drug therapy: Secondary | ICD-10-CM | POA: Diagnosis not present

## 2020-02-20 DIAGNOSIS — Z923 Personal history of irradiation: Secondary | ICD-10-CM | POA: Insufficient documentation

## 2020-02-20 LAB — CMP (CANCER CENTER ONLY)
ALT: 17 U/L (ref 0–44)
AST: 19 U/L (ref 15–41)
Albumin: 3.2 g/dL — ABNORMAL LOW (ref 3.5–5.0)
Alkaline Phosphatase: 150 U/L — ABNORMAL HIGH (ref 38–126)
Anion gap: 10 (ref 5–15)
BUN: 23 mg/dL — ABNORMAL HIGH (ref 6–20)
CO2: 22 mmol/L (ref 22–32)
Calcium: 9.9 mg/dL (ref 8.9–10.3)
Chloride: 106 mmol/L (ref 98–111)
Creatinine: 1.52 mg/dL — ABNORMAL HIGH (ref 0.61–1.24)
GFR, Est AFR Am: 58 mL/min — ABNORMAL LOW (ref >60)
GFR, Estimated: 50 mL/min — ABNORMAL LOW (ref >60)
Glucose, Bld: 141 mg/dL — ABNORMAL HIGH (ref 70–99)
Potassium: 4.3 mmol/L (ref 3.5–5.1)
Sodium: 138 mmol/L (ref 135–145)
Total Bilirubin: 0.7 mg/dL (ref 0.3–1.2)
Total Protein: 8.3 g/dL — ABNORMAL HIGH (ref 6.5–8.1)

## 2020-02-20 LAB — CBC WITH DIFFERENTIAL (CANCER CENTER ONLY)
Abs Immature Granulocytes: 0.13 10*3/uL — ABNORMAL HIGH (ref 0.00–0.07)
Basophils Absolute: 0.1 10*3/uL (ref 0.0–0.1)
Basophils Relative: 1 %
Eosinophils Absolute: 0.7 10*3/uL — ABNORMAL HIGH (ref 0.0–0.5)
Eosinophils Relative: 7 %
HCT: 39.2 % (ref 39.0–52.0)
Hemoglobin: 13.7 g/dL (ref 13.0–17.0)
Immature Granulocytes: 1 %
Lymphocytes Relative: 21 %
Lymphs Abs: 2 10*3/uL (ref 0.7–4.0)
MCH: 28.4 pg (ref 26.0–34.0)
MCHC: 34.9 g/dL (ref 30.0–36.0)
MCV: 81.3 fL (ref 80.0–100.0)
Monocytes Absolute: 0.6 10*3/uL (ref 0.1–1.0)
Monocytes Relative: 7 %
Neutro Abs: 5.9 10*3/uL (ref 1.7–7.7)
Neutrophils Relative %: 63 %
Platelet Count: 299 10*3/uL (ref 150–400)
RBC: 4.82 MIL/uL (ref 4.22–5.81)
RDW: 13.8 % (ref 11.5–15.5)
WBC Count: 9.4 10*3/uL (ref 4.0–10.5)
nRBC: 0 % (ref 0.0–0.2)

## 2020-02-20 NOTE — Progress Notes (Signed)
Hematology and Oncology Follow Up Visit  Jonathan Campbell [id number] 1961-03-08 59 y.o. 02/20/2020 8:10 AM Patient, No Pcp PerNo ref. provider found   Principle Diagnosis: 59 year old man with advanced prostate cancer disease to the bone diagnosed in March 2019. 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   Prior Therapy:  Radiation therapy as follows:  07/26/2018 - 08/08/2018: Rightsuperior glenoid of right shoulder / 30 Gy in 10 fractions of 3 Gy  12/01/2017 - 12/14/2017: 1. Left Scapula / 30 Gy in 10 fractions 2. Lumbar Spine (L4 region) / 30 Gy in 10 fractions  09/16/2017 - 09/29/2017: T Spine / 30 Gy in 10 fractions  He completed 4 weeks of Casodex between April and May 2019.  Zytiga 1000 mg daily started in May 2019. Therapy discontinued in June 2021 because of progression of disease.  Current therapy:  Lupron 30 mg given starting on 10/05/2017.  This will be repeated every 4 months.  Xtandi 80 mg daily started in June 2021.  Interim History: Jonathan Campbell returns today for a follow-up visit. Since last visit, he has started [place] and has tolerated it reasonably well. He denies any nausea, vomiting or abdominal pain. But does report excessive fatigue and tiredness. He denies any bone pain or pathological fractures. He denies any weakness or neurological deficits.       Medications: Reviewed and unchanged. Current Outpatient Medications  Medication Sig Dispense Refill  . acetaminophen (TYLENOL) 325 MG tablet Take 2 tablets (650 mg total) by mouth every 6 (six) hours as needed for mild pain. 120 tablet 0  . oxyCODONE (OXYCONTIN) 20 mg 12 hr tablet Take 1 tablet (20 mg total) by mouth every 12 (twelve) hours. 60 tablet 0  . Oxycodone HCl 10 MG TABS Take 2 tablets (20 mg total) by mouth 6 (six) times daily. PRN Pain. 360 tablet 0  . polyethylene glycol (MIRALAX / GLYCOLAX) packet Take 17 g by mouth daily. Use OTC Clearax 17 g po daily 30 each 2  . ranitidine  (ZANTAC) 300 MG tablet Take 1 tablet (300 mg total) by mouth at bedtime. 30 tablet 1  . XTANDI 80 MG tablet TAKE 2 TABLETS (160 MG TOTAL) BY MOUTH DAILY. 60 tablet 0   No current facility-administered medications for this visit.     Allergies: No Known Allergies     Physical Exam:   Blood pressure 130/80, pulse 75, temperature 98.1 F (36.7 C), temperature source Oral, resp. rate 18, weight 182 lb 14.4 oz (83 kg).      ECOG: 1   General appearance: Comfortable appearing without any discomfort Head: Normocephalic without any trauma Oropharynx: Mucous membranes are moist and pink without any thrush or ulcers. Eyes: Pupils are equal and round reactive to light. Lymph nodes: No cervical, supraclavicular, inguinal or axillary lymphadenopathy.   Heart:regular rate and rhythm.  S1 and S2 without leg edema. Lung: Clear without any rhonchi or wheezes.  No dullness to percussion. Abdomin: Soft, nontender, nondistended with good bowel sounds.  No hepatosplenomegaly. Musculoskeletal: No joint deformity or effusion.  Full range of motion noted. Neurological: No deficits noted on motor, sensory and deep tendon reflex exam. Skin: No petechial rash or dryness.  Appeared moist.            Lab Results: Lab Results  Component Value Date   WBC 9.4 02/20/2020   HGB 13.7 02/20/2020   HCT 39.2 02/20/2020   MCV 81.3 02/20/2020   PLT 299 02/20/2020     Chemistry  Component Value Date/Time   NA 137 12/06/2019 1008   K 4.3 12/06/2019 1008   CL 109 12/06/2019 1008   CO2 21 (L) 12/06/2019 1008   BUN 16 12/06/2019 1008   CREATININE 1.31 (H) 12/06/2019 1008      Component Value Date/Time   CALCIUM 9.2 12/06/2019 1008   ALKPHOS 217 (H) 12/06/2019 1008   AST 23 12/06/2019 1008   ALT 20 12/06/2019 1008   BILITOT 0.3 12/06/2019 1008      Results for Jonathan Campbell (MRN [id number]) as of 02/20/2020 08:10  Ref. Range 08/08/2019 10:33 12/06/2019 10:08  Prostate Specific Ag, Serum  Latest Ref Range: 0.0 - 4.0 ng/mL 26.7 (H) 23.5 (H)       Impression and Plan:  60 year old man with  1. Advanced prostate cancer with disease to the bone and lymphadenopathy diagnosed in March 2019. 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  The natural course of his disease was updated at this time and he is currently on Xtandi. Complications associated with this medication long-term were reviewed including hypertension, fatigue and rarely seizures. His PSA remains under reasonable control and will continue to monitor. Alternative treatment options including PARP inhibitors as well as systemic chemotherapy among others were reviewed.  2.  Spinal metastasis: Repeat MRI of the spine completed on February 06, 2020 showed no evidence of disease progression. He is status post radiation therapy previously.   3.  Bone directed therapy: Delton See has been deferred by the patient for the time being. I recommended calcium and vitamin D supplements.  4.  Androgen deprivation: He'll receive Eligard with the next visit. Complication associated with his treatment including weight gain, hot flashes among others were reiterated.  5.  Prognosis and goals of care: His disease is incurable although aggressive measures are warranted.  6. Covid vaccination considerations: Risks and benefits of receiving this vaccine were reviewed today. I encouraged him to get it done at this time without any contraindication because of his cancer treatment. He would benefit from protection given his possible declined immunity because of malignancy. He is agreeable and will receive it today in the immediate future.  7.  Follow-up: In the next 8 weeks for repeat evaluation.  30 minutes were dedicated to this visit. This time was spent on reviewing laboratory data, discussing disease status and future plan of care review.  Zola Button, MD 8/25/20218:10 AM

## 2020-02-21 ENCOUNTER — Telehealth: Payer: Self-pay

## 2020-02-21 LAB — PROSTATE-SPECIFIC AG, SERUM (LABCORP): Prostate Specific Ag, Serum: 21.2 ng/mL — ABNORMAL HIGH (ref 0.0–4.0)

## 2020-02-21 NOTE — Telephone Encounter (Signed)
-----   Message from Wyatt Portela, MD sent at 02/21/2020  9:15 AM EDT ----- Please let him know his PSA is down

## 2020-02-21 NOTE — Telephone Encounter (Signed)
Returned phone call to patient about brace. Per Dr Alen Blew, he did not order patient's brace he will have to reach out to the MD who ordered it. Patient verbalized understanding and states he will reach out to Dr Sondra Come.

## 2020-02-21 NOTE — Telephone Encounter (Signed)
-----   Message from Wyatt Portela, MD sent at 02/21/2020 12:48 PM EDT ----- I did not order it.  ----- Message ----- From: Kennedy Bucker, LPN Sent: [id number]/51/[id number]  12:35 PM EDT To: Wyatt Portela, MD  He wants to wear it. He wants a prescription to order another one. He states that you ordered the first one. Thanks  Kim ----- Message ----- From: Wyatt Portela, MD Sent: 02/21/2020  12:19 PM EDT To: Kennedy Bucker, LPN  He is ok not to wear it.  ----- Message ----- From: Kennedy Bucker, LPN Sent: 02/19/1897  12:10 PM EDT To: Wyatt Portela, MD  Spoke with patient about PSA results. Then patient states he forgot to mention during his visit that his back brace is wearing out and a part of it has broken. He has had the brace for two years. And he is starting to have pain in the place that it has broken and it is not supporting like it was before. Please advise. Thank you  Maudie Mercury LPN

## 2020-02-21 NOTE — Telephone Encounter (Signed)
Left voice message giving patient results per Dr Alen Blew.

## 2020-02-26 ENCOUNTER — Other Ambulatory Visit: Payer: Self-pay | Admitting: Radiation Oncology

## 2020-02-26 ENCOUNTER — Telehealth: Payer: Self-pay | Admitting: Oncology

## 2020-02-26 ENCOUNTER — Encounter: Payer: Self-pay | Admitting: Radiation Therapy

## 2020-02-26 MED ORDER — OXYCODONE HCL ER 20 MG PO T12A: 20.0000 mg | EXTENDED_RELEASE_TABLET | Freq: Two times a day (BID) | ORAL | 0 refills | Status: DC

## 2020-02-26 NOTE — Progress Notes (Signed)
Fax sent to [facility] [phone]  Rx written by Dr. Sondra Come requesting a TLSO brace. (not clamshell)  Diagnosis: Metastatic Prostate cancer with T12 lesion  Brace needed for Thoracic Spine Stabilization    [employer] will reach out to the patient and have him come in for a fitting.   Mont Dutton R.T.(R)(T) Radiation Special Procedures Navigator

## 2020-02-26 NOTE — Telephone Encounter (Signed)
Scheduled per 08/25 los, patient has been called and voicemail was left.

## 2020-02-27 MED FILL — XTANDI 80 MG TABS: 80 | 30 days supply | Qty: 60 | Fill #0

## 2020-02-29 MED FILL — OxyCONTIN 20 MG T12A: 20 | 30 days supply | Qty: 60 | Fill #0

## 2020-03-04 ENCOUNTER — Other Ambulatory Visit: Payer: Self-pay | Admitting: Radiation Oncology

## 2020-03-04 DIAGNOSIS — C7989 Secondary malignant neoplasm of other specified sites: Secondary | ICD-10-CM

## 2020-03-04 DIAGNOSIS — C801 Malignant (primary) neoplasm, unspecified: Secondary | ICD-10-CM

## 2020-03-04 MED ORDER — OXYCODONE HCL 10 MG PO TABS: 20.0000 mg | ORAL_TABLET | Freq: Every day | ORAL | 0 refills | Status: DC

## 2020-03-04 MED FILL — oxyCODONE HCL 10 MG TABS: 10 | 30 days supply | Qty: 360 | Fill #0

## 2020-03-19 ENCOUNTER — Other Ambulatory Visit: Payer: Self-pay | Admitting: Oncology

## 2020-03-24 MED FILL — XTANDI 80 MG TABS: 80 | 30 days supply | Qty: 60 | Fill #0

## 2020-03-27 ENCOUNTER — Other Ambulatory Visit: Payer: Self-pay | Admitting: Radiation Oncology

## 2020-03-27 MED ORDER — OXYCODONE HCL ER 20 MG PO T12A: 20.0000 mg | EXTENDED_RELEASE_TABLET | Freq: Two times a day (BID) | ORAL | 0 refills | Status: DC

## 2020-03-27 MED FILL — OxyCONTIN 20 MG T12A: 20 | 30 days supply | Qty: 60 | Fill #0

## 2020-03-31 ENCOUNTER — Other Ambulatory Visit: Payer: Self-pay | Admitting: Radiation Oncology

## 2020-03-31 DIAGNOSIS — C7989 Secondary malignant neoplasm of other specified sites: Secondary | ICD-10-CM

## 2020-03-31 MED ORDER — OXYCODONE HCL 10 MG PO TABS: 20.0000 mg | ORAL_TABLET | Freq: Every day | ORAL | 0 refills | Status: DC

## 2020-04-01 MED FILL — oxyCODONE HCL 10 MG TABS: 10 | 30 days supply | Qty: 180 | Fill #0

## 2020-04-01 MED FILL — oxyCODONE HCL 10 MG TABS: 10 | 30 days supply | Qty: 180 | Fill #1

## 2020-04-14 ENCOUNTER — Other Ambulatory Visit: Payer: Self-pay | Admitting: Oncology

## 2020-04-17 MED FILL — XTANDI 80 MG TABS: 80 | 30 days supply | Qty: 60 | Fill #0

## 2020-04-23 ENCOUNTER — Inpatient Hospital Stay: Payer: Medicare Other

## 2020-04-23 ENCOUNTER — Other Ambulatory Visit: Payer: Self-pay | Admitting: Radiation Oncology

## 2020-04-23 ENCOUNTER — Inpatient Hospital Stay: Payer: Medicare Other | Admitting: Oncology

## 2020-04-23 MED ORDER — OXYCODONE HCL ER 20 MG PO T12A: 20.0000 mg | EXTENDED_RELEASE_TABLET | Freq: Two times a day (BID) | ORAL | 0 refills | Status: DC

## 2020-04-24 ENCOUNTER — Telehealth: Payer: Self-pay | Admitting: Oncology

## 2020-04-24 MED FILL — OxyCONTIN 20 MG T12A: 20 | 30 days supply | Qty: 60 | Fill #0

## 2020-04-24 NOTE — Telephone Encounter (Signed)
R/s appt per 10/27 sch msg - pt is aware of new appt date and time

## 2020-04-28 ENCOUNTER — Other Ambulatory Visit: Payer: Self-pay | Admitting: Radiation Oncology

## 2020-04-28 DIAGNOSIS — C801 Malignant (primary) neoplasm, unspecified: Secondary | ICD-10-CM

## 2020-04-28 DIAGNOSIS — C7989 Secondary malignant neoplasm of other specified sites: Secondary | ICD-10-CM

## 2020-04-28 MED ORDER — OXYCODONE HCL 10 MG PO TABS: 20.0000 mg | ORAL_TABLET | Freq: Every day | ORAL | 0 refills | Status: DC

## 2020-04-29 ENCOUNTER — Other Ambulatory Visit: Payer: Self-pay | Admitting: Radiation Oncology

## 2020-04-29 DIAGNOSIS — C7989 Secondary malignant neoplasm of other specified sites: Secondary | ICD-10-CM

## 2020-04-29 MED ORDER — OXYCODONE HCL 10 MG PO TABS: 20.0000 mg | ORAL_TABLET | Freq: Every day | ORAL | 0 refills | Status: DC

## 2020-05-01 ENCOUNTER — Inpatient Hospital Stay: Payer: Medicare Other | Admitting: Oncology

## 2020-05-01 ENCOUNTER — Inpatient Hospital Stay: Payer: Medicare Other

## 2020-05-01 ENCOUNTER — Inpatient Hospital Stay: Payer: Medicare Other | Attending: Oncology

## 2020-05-01 DIAGNOSIS — Z923 Personal history of irradiation: Secondary | ICD-10-CM | POA: Insufficient documentation

## 2020-05-01 DIAGNOSIS — C61 Malignant neoplasm of prostate: Secondary | ICD-10-CM | POA: Insufficient documentation

## 2020-05-01 DIAGNOSIS — C7951 Secondary malignant neoplasm of bone: Secondary | ICD-10-CM | POA: Insufficient documentation

## 2020-05-02 ENCOUNTER — Telehealth: Payer: Self-pay | Admitting: Oncology

## 2020-05-02 ENCOUNTER — Telehealth: Payer: Self-pay

## 2020-05-02 NOTE — Telephone Encounter (Signed)
-----   Message from Wyatt Portela, MD sent at 05/02/2020  1:24 PM EDT ----- Madaline Brilliant. thanks ----- Message ----- From: Kennedy Bucker, LPN Sent: [id number]   1:17 PM EDT To: Wyatt Portela, MD  Patient needs to reschedule appointment from 05/01/20. I will send a scheduling message.  Kim LPN

## 2020-05-02 NOTE — Telephone Encounter (Signed)
Called pt per 11/5 sch msg to reschedule 11/4 appt - no answer. Left message for patient to call back to reschedule appt.

## 2020-05-02 NOTE — Telephone Encounter (Signed)
Sent scheduling message to reschedule appointments from 05/01/20

## 2020-05-12 ENCOUNTER — Other Ambulatory Visit: Payer: Self-pay | Admitting: Oncology

## 2020-05-13 ENCOUNTER — Other Ambulatory Visit: Payer: Self-pay | Admitting: Oncology

## 2020-05-13 MED FILL — XTANDI 80 MG TABS: 80 | 30 days supply | Qty: 60 | Fill #0

## 2020-05-14 ENCOUNTER — Telehealth: Payer: Self-pay | Admitting: Oncology

## 2020-05-14 NOTE — Telephone Encounter (Signed)
Patient called in to r/s appt missed from 11/4 . Scheduled new appt. Confirmed with patient

## 2020-05-20 ENCOUNTER — Telehealth: Payer: Self-pay

## 2020-05-20 ENCOUNTER — Other Ambulatory Visit: Payer: Self-pay | Admitting: Radiation Oncology

## 2020-05-20 MED ORDER — OXYCODONE HCL ER 20 MG PO T12A: 20.0000 mg | EXTENDED_RELEASE_TABLET | Freq: Two times a day (BID) | ORAL | 0 refills | Status: DC

## 2020-05-20 NOTE — Telephone Encounter (Signed)
Returned call to patient's fiance as no answer at the # for the patient. Advised her Dr. Sondra Come plans to send in a refill today for Oxycontin 20 mg to [facility] and to please advise the patient.

## 2020-05-21 ENCOUNTER — Other Ambulatory Visit: Payer: Self-pay

## 2020-05-21 ENCOUNTER — Inpatient Hospital Stay (HOSPITAL_BASED_OUTPATIENT_CLINIC_OR_DEPARTMENT_OTHER): Payer: Medicare Other | Admitting: Oncology

## 2020-05-21 ENCOUNTER — Inpatient Hospital Stay: Payer: Medicare Other

## 2020-05-21 VITALS — BP 134/76 | HR 73 | Temp 97.4°F | Resp 18 | Ht 69.0 in | Wt 188.3 lb

## 2020-05-21 DIAGNOSIS — C7951 Secondary malignant neoplasm of bone: Secondary | ICD-10-CM | POA: Diagnosis present

## 2020-05-21 DIAGNOSIS — C61 Malignant neoplasm of prostate: Secondary | ICD-10-CM

## 2020-05-21 DIAGNOSIS — Z923 Personal history of irradiation: Secondary | ICD-10-CM | POA: Diagnosis not present

## 2020-05-21 LAB — CMP (CANCER CENTER ONLY)
ALT: 14 U/L (ref 0–44)
AST: 18 U/L (ref 15–41)
Albumin: 3.2 g/dL — ABNORMAL LOW (ref 3.5–5.0)
Alkaline Phosphatase: 148 U/L — ABNORMAL HIGH (ref 38–126)
Anion gap: 7 (ref 5–15)
BUN: 21 mg/dL — ABNORMAL HIGH (ref 6–20)
CO2: 20 mmol/L — ABNORMAL LOW (ref 22–32)
Calcium: 8.9 mg/dL (ref 8.9–10.3)
Chloride: 111 mmol/L (ref 98–111)
Creatinine: 1.18 mg/dL (ref 0.61–1.24)
GFR, Estimated: >60 mL/min (ref >60)
Glucose, Bld: 125 mg/dL — ABNORMAL HIGH (ref 70–99)
Potassium: 4.1 mmol/L (ref 3.5–5.1)
Sodium: 138 mmol/L (ref 135–145)
Total Bilirubin: 0.3 mg/dL (ref 0.3–1.2)
Total Protein: 7.8 g/dL (ref 6.5–8.1)

## 2020-05-21 LAB — CBC WITH DIFFERENTIAL (CANCER CENTER ONLY)
Abs Immature Granulocytes: 0.06 10*3/uL (ref 0.00–0.07)
Basophils Absolute: 0.1 10*3/uL (ref 0.0–0.1)
Basophils Relative: 1 %
Eosinophils Absolute: 0.4 10*3/uL (ref 0.0–0.5)
Eosinophils Relative: 5 %
HCT: 39.9 % (ref 39.0–52.0)
Hemoglobin: 13.7 g/dL (ref 13.0–17.0)
Immature Granulocytes: 1 %
Lymphocytes Relative: 19 %
Lymphs Abs: 1.6 10*3/uL (ref 0.7–4.0)
MCH: 28.7 pg (ref 26.0–34.0)
MCHC: 34.3 g/dL (ref 30.0–36.0)
MCV: 83.6 fL (ref 80.0–100.0)
Monocytes Absolute: 0.6 10*3/uL (ref 0.1–1.0)
Monocytes Relative: 8 %
Neutro Abs: 5.4 10*3/uL (ref 1.7–7.7)
Neutrophils Relative %: 66 %
Platelet Count: 215 10*3/uL (ref 150–400)
RBC: 4.77 MIL/uL (ref 4.22–5.81)
RDW: 13.2 % (ref 11.5–15.5)
WBC Count: 8.2 10*3/uL (ref 4.0–10.5)
nRBC: 0 % (ref 0.0–0.2)

## 2020-05-21 MED ORDER — LEUPROLIDE ACETATE (4 MONTH) 30 MG ~~LOC~~ KIT
PACK | SUBCUTANEOUS | Status: AC
  Filled 2020-05-21: qty 30

## 2020-05-21 MED ORDER — LEUPROLIDE ACETATE (4 MONTH) 30 MG ~~LOC~~ KIT
30.0000 mg | PACK | Freq: Once | SUBCUTANEOUS | Status: AC
  Administered 2020-05-21: 30 mg via SUBCUTANEOUS

## 2020-05-21 NOTE — Patient Instructions (Signed)

## 2020-05-21 NOTE — Progress Notes (Signed)
Hematology and Oncology Follow Up Visit  Jonathan Campbell [id number] Nov 21, 1960 59 y.o. 05/21/2020 9:31 AM Patient, No Pcp PerNo ref. provider found   Principle Diagnosis: 59 year old man with castration-resistant advanced prostate cancer with disease to the bone diagnosed in 2019.  He presented with PSA of 819.    Prior Therapy:  Radiation therapy as follows:  07/26/2018 - 08/08/2018: Rightsuperior glenoid of right shoulder / 30 Gy in 10 fractions of 3 Gy  12/01/2017 - 12/14/2017: 1. Left Scapula / 30 Gy in 10 fractions 2. Lumbar Spine (L4 region) / 30 Gy in 10 fractions  09/16/2017 - 09/29/2017: T Spine / 30 Gy in 10 fractions  He completed 4 weeks of Casodex between April and May 2019.  Zytiga 1000 mg daily started in May 2019. Therapy discontinued in June 2021 because of progression of disease.  Current therapy:  Lupron 30 mg given starting on 10/05/2017.  This will be repeated every 4 months.  Xtandi 80 mg daily started in June 2021.  Interim History: Jonathan Campbell is here for return evaluation.  Since the last visit, he reports no major changes in his health.  He continues to tolerate Xtandi without any complications.  He denies any nausea, vomiting or abdominal pain.  He denies any recent hospitalization or illnesses.  He reports his pain is manageable currently on OxyContin and oxycodone breakthrough.      Medications: Updated on review. Current Outpatient Medications  Medication Sig Dispense Refill  . acetaminophen (TYLENOL) 325 MG tablet Take 2 tablets (650 mg total) by mouth every 6 (six) hours as needed for mild pain. 120 tablet 0  . oxyCODONE (OXYCONTIN) 20 mg 12 hr tablet Take 1 tablet (20 mg total) by mouth every 12 (twelve) hours. 60 tablet 0  . Oxycodone HCl 10 MG TABS Take 2 tablets (20 mg total) by mouth 6 (six) times daily. PRN Pain. 360 tablet 0  . polyethylene glycol (MIRALAX / GLYCOLAX) packet Take 17 g by mouth daily. Use OTC Clearax 17 g po daily 30 each  2  . ranitidine (ZANTAC) 300 MG tablet Take 1 tablet (300 mg total) by mouth at bedtime. 30 tablet 1  . XTANDI 80 MG tablet TAKE 2 TABLETS (160 MG TOTAL) BY MOUTH DAILY. 60 tablet 0   No current facility-administered medications for this visit.     Allergies: No Known Allergies     Physical Exam:    Blood pressure 134/76, pulse 73, temperature (!) 97.4 F (36.3 C), temperature source Tympanic, resp. rate 18, height 5\' 9"  (1.753 m), weight 188 lb 4.8 oz (85.4 kg), SpO2 100 %.      ECOG: 1    General appearance: Alert, awake without any distress. Head: Atraumatic without abnormalities Oropharynx: Without any thrush or ulcers. Eyes: No scleral icterus. Lymph nodes: No lymphadenopathy noted in the cervical, supraclavicular, or axillary nodes Heart:regular rate and rhythm, without any murmurs or gallops.   Lung: Clear to auscultation without any rhonchi, wheezes or dullness to percussion. Abdomin: Soft, nontender without any shifting dullness or ascites. Musculoskeletal: No clubbing or cyanosis. Neurological: No motor or sensory deficits. Skin: No rashes or lesions.            Lab Results: Lab Results  Component Value Date   WBC 9.4 02/20/2020   HGB 13.7 02/20/2020   HCT 39.2 02/20/2020   MCV 81.3 02/20/2020   PLT 299 02/20/2020     Chemistry      Component Value Date/Time   NA 138 02/20/2020  0754   K 4.3 02/20/2020 0754   CL 106 02/20/2020 0754   CO2 22 02/20/2020 0754   BUN 23 (H) 02/20/2020 0754   CREATININE 1.52 (H) 02/20/2020 0754      Component Value Date/Time   CALCIUM 9.9 02/20/2020 0754   ALKPHOS 150 (H) 02/20/2020 0754   AST 19 02/20/2020 0754   ALT 17 02/20/2020 0754   BILITOT 0.7 02/20/2020 0754      Results for Jonathan Campbell (MRN [id number]) as of 02/20/2020 08:10  Ref. Range 08/08/2019 10:33 12/06/2019 10:08  Prostate Specific Ag, Serum Latest Ref Range: 0.0 - 4.0 ng/mL 26.7 (H) 23.5 (H)       Impression and  Plan:  59 year old man with  1.  Castration-resistant advanced prostate cancer with disease to the bone and lymphadenopathy presented in March 2019.   He is currently on Xtandi without any major complaints.  PSA in June 2021 showed mild decline.  The natural course of this disease was updated and alternative treatment options including systemic chemotherapy were reiterated.  He is agreeable to continue at this time.  2.  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  No recent exacerbation at this time.   3.  Bone directed therapy: I recommended calcium and vitamin D supplements and Xgeva after obtaining dental clearance.  4.  Androgen deprivation: He will receive Eligard today and repeated in 4 months.  Long-term complications including weight gain, hot flashes among others were reiterated.  5.  Prognosis and goals of care: His disease is incurable although aggressive measures are warranted given his reasonable performance status.   6.  Follow-up: In 2 months for repeat follow-up.  30 minutes were spent on this encounter.  The time was dedicated to reviewing his disease status, discussing treatment options and future plan of care review.  Zola Button, MD 11/24/20219:31 AM

## 2020-05-22 LAB — PROSTATE-SPECIFIC AG, SERUM (LABCORP): Prostate Specific Ag, Serum: 135 ng/mL — ABNORMAL HIGH (ref 0.0–4.0)

## 2020-05-26 ENCOUNTER — Telehealth: Payer: Self-pay | Admitting: *Deleted

## 2020-05-26 NOTE — Telephone Encounter (Signed)
Patient called requesting refill of his Oxycodone 10 mg to be refilled to [facility].  Patient was given a 30 day supply on 04/29/2020.  Routed to Dr Sondra Come to review.

## 2020-05-27 ENCOUNTER — Other Ambulatory Visit: Payer: Self-pay | Admitting: Radiation Oncology

## 2020-05-27 DIAGNOSIS — C7989 Secondary malignant neoplasm of other specified sites: Secondary | ICD-10-CM

## 2020-05-27 MED ORDER — OXYCODONE HCL 10 MG PO TABS: 20.0000 mg | ORAL_TABLET | Freq: Every day | ORAL | 0 refills | Status: DC

## 2020-06-12 ENCOUNTER — Telehealth: Payer: Self-pay

## 2020-06-12 ENCOUNTER — Other Ambulatory Visit: Payer: Self-pay | Admitting: Radiation Oncology

## 2020-06-12 ENCOUNTER — Telehealth: Payer: Self-pay | Admitting: Radiation Oncology

## 2020-06-12 MED ORDER — OXYCODONE HCL ER 20 MG PO T12A: 20.0000 mg | EXTENDED_RELEASE_TABLET | Freq: Two times a day (BID) | ORAL | 0 refills | Status: DC

## 2020-06-12 NOTE — Telephone Encounter (Signed)
Received a handwritten note from Romie Jumper that this patient would like a return call from Dr. Clabe Seal nurse. Phoned patient to inquire. No answer. No option to leave a message because his voicemail has not been set up.

## 2020-06-12 NOTE — Telephone Encounter (Signed)
Patient called asking for a refill of his Oxycontin 20 mg ER to be sent to [facility] for pick up on 06/19/2020. Patient advised will ask Dr. Sondra Come to complete this request.

## 2020-06-13 NOTE — Telephone Encounter (Signed)
Error

## 2020-06-24 ENCOUNTER — Other Ambulatory Visit: Payer: Self-pay | Admitting: Radiation Oncology

## 2020-06-24 DIAGNOSIS — C7989 Secondary malignant neoplasm of other specified sites: Secondary | ICD-10-CM

## 2020-06-24 MED ORDER — OXYCODONE HCL 10 MG PO TABS: 20.0000 mg | ORAL_TABLET | Freq: Every day | ORAL | 0 refills | Status: DC

## 2020-07-10 ENCOUNTER — Telehealth: Payer: Self-pay | Admitting: *Deleted

## 2020-07-10 ENCOUNTER — Other Ambulatory Visit: Payer: Self-pay | Admitting: Oncology

## 2020-07-10 NOTE — Telephone Encounter (Signed)
Patient called to state that he went out of town over the holidays and is just now calling to advise that [facility] lost his luggage and are unable to get his luggage for an unknown timeframe.  He states he has 3 days worth of the oxycodone and 2 days oxyctonin and that he doesn't know when he will be able to get the missing medication back.    Advised that we will be unable to refill the narcotics until his normal refill timeframe.  He is concerned about his refill for Xtandi.  Instructed him to reach out to Dr. Hazeline Junker office to see if he can get refill for the [facility].  Routed to Dr Hazeline Junker nurse.

## 2020-07-10 NOTE — Telephone Encounter (Signed)
CALLED PATIENT TO INFORM OF FU WITH DR. Cuba ON 07-31-20 @ 11 AM , UNABLE TO LEAVE MESSAGE, NO VM, MAILED APPT. CARD

## 2020-07-17 ENCOUNTER — Telehealth: Payer: Self-pay | Admitting: *Deleted

## 2020-07-17 ENCOUNTER — Ambulatory Visit: Payer: Medicare Other | Admitting: Radiation Oncology

## 2020-07-17 ENCOUNTER — Other Ambulatory Visit: Payer: Self-pay | Admitting: Radiation Oncology

## 2020-07-17 MED ORDER — OXYCODONE HCL ER 20 MG PO T12A: 20.0000 mg | EXTENDED_RELEASE_TABLET | Freq: Two times a day (BID) | ORAL | 0 refills | Status: DC

## 2020-07-17 NOTE — Telephone Encounter (Signed)
Patient called asking for refill of his Oxycontin to pharmacy on file.  Routed to MD to review.

## 2020-07-21 ENCOUNTER — Telehealth: Payer: Self-pay | Admitting: *Deleted

## 2020-07-21 NOTE — Telephone Encounter (Signed)
Patient called requesting to get refill of the Oxycodone 20 mg q 6 hrs PRN to [facility].  Routed to Dr. Sondra Come.

## 2020-07-22 ENCOUNTER — Other Ambulatory Visit: Payer: Self-pay | Admitting: Radiation Oncology

## 2020-07-22 DIAGNOSIS — C7989 Secondary malignant neoplasm of other specified sites: Secondary | ICD-10-CM

## 2020-07-22 DIAGNOSIS — C801 Malignant (primary) neoplasm, unspecified: Secondary | ICD-10-CM

## 2020-07-22 MED ORDER — OXYCODONE HCL 10 MG PO TABS: 20.0000 mg | ORAL_TABLET | Freq: Every day | ORAL | 0 refills | Status: DC

## 2020-07-30 ENCOUNTER — Telehealth: Payer: Self-pay | Admitting: Oncology

## 2020-07-30 NOTE — Telephone Encounter (Signed)
R/s appt per 2/1 sch msg - pt is aware of appt moved to 2/3

## 2020-07-31 ENCOUNTER — Inpatient Hospital Stay: Payer: Medicare Other | Admitting: Oncology

## 2020-07-31 ENCOUNTER — Inpatient Hospital Stay: Payer: Medicare Other

## 2020-07-31 ENCOUNTER — Ambulatory Visit
Admission: RE | Admit: 2020-07-31 | Discharge: 2020-07-31 | Disposition: A | Discharge status: Home / Self Care | Payer: Medicare Other | Source: Ambulatory Visit | Attending: Radiation Oncology | Admitting: Radiation Oncology

## 2020-07-31 NOTE — Progress Notes (Incomplete)
  Radiation Oncology         (336) [phone] ________________________________  Name: Jonathan Campbell MRN: [id number]  Date: 07/31/2020  DOB: 1961-06-19  Follow-Up Visit Note  CC: Patient, No Pcp Per  Domenic Polite, MD  No diagnosis found.  Diagnosis: Prostate cancer metastatic to bone   Interval Since Last Radiation: One year, eleven months, three weeks, and two days  07/26/2018 - 08/08/2018: Rightsuperior glenoid of right shoulder / 30 Gy in 10 fractions of 3 Gy  12/01/2017 - 12/14/2017: 1. Left Scapula / 30 Gy in 10 fractions 2. Lumbar Spine (L4 region) / 30 Gy in 10 fractions  09/16/2017 - 09/29/2017: T Spine / 30 Gy in 10 fractions  Narrative: The patient returns today for routine follow-up. Since his last visit, he underwent an MRI of the thoracic spine on 02/06/2020 that showed treated metastatic disease at T12 that was unchanged from prior study. There were no new metastatic deposits.  On review of systems, he reports ***. He denies ***.   ALLERGIES:  has No Known Allergies.  Meds: Current Outpatient Medications  Medication Sig Dispense Refill  . acetaminophen (TYLENOL) 325 MG tablet Take 2 tablets (650 mg total) by mouth every 6 (six) hours as needed for mild pain. 120 tablet 0  . oxyCODONE (OXYCONTIN) 20 mg 12 hr tablet Take 1 tablet (20 mg total) by mouth every 12 (twelve) hours. 60 tablet 0  . Oxycodone HCl 10 MG TABS Take 2 tablets (20 mg total) by mouth 6 (six) times daily. PRN Pain. 360 tablet 0  . polyethylene glycol (MIRALAX / GLYCOLAX) packet Take 17 g by mouth daily. Use OTC Clearax 17 g po daily 30 each 2  . ranitidine (ZANTAC) 300 MG tablet Take 1 tablet (300 mg total) by mouth at bedtime. 30 tablet 1  . XTANDI 80 MG tablet TAKE 2 TABLETS (160 MG TOTAL) BY MOUTH DAILY. 60 tablet 0   No current facility-administered medications for this encounter.    Physical Findings: The patient is in no acute distress. Patient is alert and oriented.  vitals were not  taken for this visit.  Lungs are clear to auscultation bilaterally. Heart has regular rate and rhythm. No palpable cervical, supraclavicular, or axillary adenopathy. Abdomen soft, non-tender, normal bowel sounds. ***  Lab Findings: Lab Results  Component Value Date   WBC 8.2 05/21/2020   HGB 13.7 05/21/2020   HCT 39.9 05/21/2020   MCV 83.6 05/21/2020   PLT 215 05/21/2020    Radiographic Findings: No results found.  Impression: Prostate cancer metastatic to bone  Clinically stable at this time. Recent MRI of thoracic spine as stable.  Plan: The patient will follow up with radiation oncology in *** months.  Total time spent in this encounter was *** minutes which included reviewing the patient's most recent MRI of thoracic spine, physical examination, and documentation.  -----------------------------------  Blair Promise, PhD, MD  This document serves as a record of services personally performed by Gery Pray, MD. It was created on his behalf by Clerance Lav, a trained medical scribe. The creation of this record is based on the scribe's personal observations and the provider's statements to them. This document has been checked and approved by the attending provider.

## 2020-08-01 ENCOUNTER — Inpatient Hospital Stay: Payer: Medicare Other | Admitting: Oncology

## 2020-08-01 ENCOUNTER — Inpatient Hospital Stay: Payer: Medicare Other

## 2020-08-04 ENCOUNTER — Ambulatory Visit
Admission: RE | Admit: 2020-08-04 | Discharge: 2020-08-04 | Disposition: A | Discharge status: Home / Self Care | Payer: Medicare Other | Source: Ambulatory Visit | Attending: Radiation Oncology | Admitting: Radiation Oncology

## 2020-08-04 ENCOUNTER — Encounter: Payer: Self-pay | Admitting: Radiation Oncology

## 2020-08-04 ENCOUNTER — Other Ambulatory Visit: Payer: Self-pay

## 2020-08-04 VITALS — BP 133/83 | HR 123 | Temp 98.0°F | Resp 20 | Ht 69.0 in | Wt 186.4 lb

## 2020-08-04 DIAGNOSIS — C7951 Secondary malignant neoplasm of bone: Secondary | ICD-10-CM | POA: Diagnosis not present

## 2020-08-04 DIAGNOSIS — M545 Low back pain, unspecified: Secondary | ICD-10-CM | POA: Diagnosis not present

## 2020-08-04 DIAGNOSIS — M79671 Pain in right foot: Secondary | ICD-10-CM | POA: Insufficient documentation

## 2020-08-04 DIAGNOSIS — M25552 Pain in left hip: Secondary | ICD-10-CM | POA: Diagnosis not present

## 2020-08-04 DIAGNOSIS — C61 Malignant neoplasm of prostate: Secondary | ICD-10-CM | POA: Diagnosis not present

## 2020-08-04 DIAGNOSIS — M25551 Pain in right hip: Secondary | ICD-10-CM | POA: Diagnosis not present

## 2020-08-04 DIAGNOSIS — Z923 Personal history of irradiation: Secondary | ICD-10-CM | POA: Diagnosis not present

## 2020-08-04 DIAGNOSIS — R252 Cramp and spasm: Secondary | ICD-10-CM | POA: Insufficient documentation

## 2020-08-04 NOTE — Progress Notes (Signed)
Patient is here today for follow up to radiation to prostate/spine.  Patient complains of pain in lower back 7 out of 10.  He states that the pain in the back and it radiates down the the right leg (sharp shooting).  Cramping in the foot.  Patient does not require a walker or cane to get around.  He does wear a back brace daily.  He stated that he fell in January with no injury.    Patient reports urination is fine except nocturia X 4.  Denies diarrhea or constipation.  Denies nausea/vomiting.  Vitals:   08/04/20 1629  BP: 133/83  Pulse: (!) 123  Resp: 20  Temp: 98 F (36.7 C)  SpO2: 100%  Weight: 186 lb 6.4 oz (84.6 kg)  Height: 5\' 9"  (1.753 m)

## 2020-08-04 NOTE — Progress Notes (Signed)
Radiation Oncology         [phone] ________________________________  Name: Jonathan Campbell MRN: [id number]  Date: 08/04/2020  DOB: [birth year]  Follow-Up Visit Note  CC: Patient, No Pcp Per  Jonathan Polite, MD    ICD-10-CM   1. Prostate cancer metastatic to bone [facility])  C61 NM Bone Scan Whole Body   C79.51     Diagnosis: Prostate cancer metastatic to bone   Interval Since Last Radiation: Two years  07/26/2018 - 08/08/2018: Rightsuperior glenoid of right shoulder / 30 Gy in 10 fractions of 3 Gy  12/01/2017 - 12/14/2017: 1. Left Scapula / 30 Gy in 10 fractions 2. Lumbar Spine (L4 region) / 30 Gy in 10 fractions  09/16/2017 - 09/29/2017: T Spine / 30 Gy in 10 fractions  Narrative: The patient returns today for routine follow-up. Since his last visit, he underwent an MRI of the thoracic spine on 02/06/2020 that showed treated metastatic disease at T12 that was unchanged from prior study. There were no new metastatic deposits.  On review of systems, he reports increasing pain in the lower back and bilateral hip area.  This is particularly bothersome after he fell In January. He denies difficulties with urination or constipation or stool leakage.  He does have pain radiating into his right right leg sometimes sharp and shooting increased cramping in his right foot..   ALLERGIES:  has No Known Allergies.  Meds: Current Outpatient Medications  Medication Sig Dispense Refill  . oxyCODONE (OXYCONTIN) 20 mg 12 hr tablet Take 1 tablet (20 mg total) by mouth every 12 (twelve) hours. 60 tablet 0  . Oxycodone HCl 10 MG TABS Take 2 tablets (20 mg total) by mouth 6 (six) times daily. PRN Pain. 360 tablet 0  . polyethylene glycol (MIRALAX / GLYCOLAX) packet Take 17 g by mouth daily. Use OTC Clearax 17 g po daily 30 each 2  . XTANDI 80 MG tablet TAKE 2 TABLETS (160 MG TOTAL) BY MOUTH DAILY. 60 tablet 0   No current facility-administered medications for this encounter.    Physical  Findings: The patient is in no acute distress. Patient is alert and oriented.  height is 5\' 9"  (1.753 m) and weight is 186 lb 6.4 oz (84.6 kg). His temperature is 98 F (36.7 C). His blood pressure is 133/83 and his pulse is 123 (abnormal). His respiration is 20 and oxygen saturation is 100%.  Lungs are clear to auscultation bilaterally. Heart has regular rate and rhythm. No palpable cervical, supraclavicular, or axillary adenopathy. Abdomen soft, non-tender, normal bowel sounds.  Patient continues wear a back brace.  He is ambulating without the assistance of a walker or cane.  On neurological examination motor strength is 5 out of 5 in the proximal and distal muscle groups of the lower extremities.  Lab Findings: Lab Results  Component Value Date   WBC 8.2 05/21/2020   HGB 13.7 05/21/2020   HCT 39.9 05/21/2020   MCV 83.6 05/21/2020   PLT 215 05/21/2020    Radiographic Findings: No results found.  Impression: Prostate cancer metastatic to bone  Patient is having increasing pain in the pelvis and lower back area as above.  His bone scan last year did show the metastasis at L2.  Plan: The patient will follow up with radiation oncology in after his up coming bone scan is complete.  H will also follow-up with Dr. Alen Blew in March and have blood work on the same day  Total time spent in this encounter was  20 minutes which included reviewing the patient's most recent MRI of thoracic spine, physical examination, and documentation and ordering of bone scan.  -----------------------------------  Blair Promise, PhD, MD  This document serves as a record of services personally performed by Gery Pray, MD. It was created on his behalf by Clerance Lav, a trained medical scribe. The creation of this record is based on the scribe's personal observations and the provider's statements to them. This document has been checked and approved by the attending provider.

## 2020-08-05 ENCOUNTER — Telehealth: Payer: Self-pay | Admitting: Oncology

## 2020-08-05 NOTE — Telephone Encounter (Signed)
Called patient regarding upcoming appointments, called both numbers and they do not reach a voicemail nor has anyone picked up. Calender will be mailed.

## 2020-08-11 ENCOUNTER — Telehealth: Payer: Self-pay | Admitting: *Deleted

## 2020-08-11 MED FILL — XTANDI 80 MG TABS: 80 | 30 days supply | Qty: 60 | Fill #0

## 2020-08-11 NOTE — Telephone Encounter (Signed)
RETURNED PATIENT'S PHONE CALL, SPOKE WITH PATIENT. ?

## 2020-08-12 ENCOUNTER — Ambulatory Visit (HOSPITAL_COMMUNITY)
Admission: RE | Admit: 2020-08-12 | Discharge: 2020-08-12 | Disposition: A | Discharge status: Home / Self Care | Payer: Medicare Other | Source: Ambulatory Visit | Attending: Radiation Oncology | Admitting: Radiation Oncology

## 2020-08-12 ENCOUNTER — Other Ambulatory Visit: Payer: Self-pay | Admitting: Radiation Oncology

## 2020-08-12 ENCOUNTER — Telehealth: Payer: Self-pay | Admitting: *Deleted

## 2020-08-12 ENCOUNTER — Other Ambulatory Visit: Payer: Self-pay

## 2020-08-12 ENCOUNTER — Encounter (HOSPITAL_COMMUNITY)
Admission: RE | Admit: 2020-08-12 | Discharge: 2020-08-12 | Disposition: A | Discharge status: Home / Self Care | Payer: Medicare Other | Source: Ambulatory Visit | Attending: Radiation Oncology | Admitting: Radiation Oncology

## 2020-08-12 DIAGNOSIS — C61 Malignant neoplasm of prostate: Secondary | ICD-10-CM | POA: Insufficient documentation

## 2020-08-12 DIAGNOSIS — C7951 Secondary malignant neoplasm of bone: Secondary | ICD-10-CM | POA: Diagnosis present

## 2020-08-12 MED ORDER — TECHNETIUM TC 99M MEDRONATE IV KIT
21.8000 | PACK | Freq: Once | INTRAVENOUS | Status: AC
  Administered 2020-08-12: 21.8 via INTRAVENOUS

## 2020-08-12 MED ORDER — OXYCODONE HCL ER 20 MG PO T12A: 20.0000 mg | EXTENDED_RELEASE_TABLET | Freq: Two times a day (BID) | ORAL | 0 refills | Status: DC

## 2020-08-12 NOTE — Telephone Encounter (Signed)
Patient called to request a refill of his Oxycontin 20 mg to [facility].  Last time patient was in for a visit he notified us that his insurance advised him that he will have to get a new prior authorization for refills.    Routed to Dr to review for refill and then follow up for prior auth will need to be done.

## 2020-08-14 ENCOUNTER — Other Ambulatory Visit: Payer: Self-pay

## 2020-08-14 ENCOUNTER — Encounter: Payer: Self-pay | Admitting: Radiation Oncology

## 2020-08-14 ENCOUNTER — Ambulatory Visit
Admission: RE | Admit: 2020-08-14 | Discharge: 2020-08-14 | Disposition: A | Discharge status: Home / Self Care | Payer: Medicare Other | Source: Ambulatory Visit | Attending: Radiation Oncology | Admitting: Radiation Oncology

## 2020-08-14 VITALS — BP 129/92 | HR 112 | Temp 97.7°F | Resp 20 | Ht 69.0 in | Wt 186.0 lb

## 2020-08-14 DIAGNOSIS — C7951 Secondary malignant neoplasm of bone: Secondary | ICD-10-CM | POA: Diagnosis not present

## 2020-08-14 DIAGNOSIS — Z79899 Other long term (current) drug therapy: Secondary | ICD-10-CM | POA: Insufficient documentation

## 2020-08-14 DIAGNOSIS — C61 Malignant neoplasm of prostate: Secondary | ICD-10-CM | POA: Diagnosis not present

## 2020-08-14 DIAGNOSIS — Z923 Personal history of irradiation: Secondary | ICD-10-CM | POA: Insufficient documentation

## 2020-08-14 DIAGNOSIS — M25511 Pain in right shoulder: Secondary | ICD-10-CM | POA: Insufficient documentation

## 2020-08-14 NOTE — Progress Notes (Signed)
Radiation Oncology         [phone]) [phone] ________________________________  Name: Jonathan Campbell MRN: [id number]  Date: 08/14/2020  DOB: Apr 07, 1961  Follow-Up Visit Note  CC: Sonia Side., FNP  Domenic Polite, MD    ICD-10-CM   1. Prostate cancer metastatic to bone [facility])  C61    C79.51     Diagnosis: Prostate cancer metastatic to bone   Interval Since Last Radiation: Two years and six days  07/26/2018 - 08/08/2018: Rightsuperior glenoid of right shoulder / 30 Gy in 10 fractions of 3 Gy  12/01/2017 - 12/14/2017: 1. Left Scapula / 30 Gy in 10 fractions 2. Lumbar Spine (L4 region) / 30 Gy in 10 fractions  09/16/2017 - 09/29/2017: T Spine / 30 Gy in 10 fractions  Narrative: The patient returns today to discuss recent bone scan. Study was performed on 08/12/2020 and showed progressive osseous metastatic disease.  On review of systems, he reports pain in his right shoulder area after undergoing a fall several weeks to this area.  Other areas previously noted have been stable without any significant increase in pain. He denies numbness or tingling in his lower extremities.  He continues to wear a back brace when out and about.  He is comfortable on his current regimen of oxycodone and OxyContin.  He denies any problems with urinary retention or weakness in his lower extremities.   ALLERGIES:  has No Known Allergies.  Meds: Current Outpatient Medications  Medication Sig Dispense Refill  . oxyCODONE (OXYCONTIN) 20 mg 12 hr tablet Take 1 tablet (20 mg total) by mouth every 12 (twelve) hours. 60 tablet 0  . Oxycodone HCl 10 MG TABS Take 2 tablets (20 mg total) by mouth 6 (six) times daily. PRN Pain. 360 tablet 0  . polyethylene glycol (MIRALAX / GLYCOLAX) packet Take 17 g by mouth daily. Use OTC Clearax 17 g po daily 30 each 2  . XTANDI 80 MG tablet TAKE 2 TABLETS (160 MG TOTAL) BY MOUTH DAILY. 60 tablet 0   No current facility-administered medications for this encounter.     Physical Findings: The patient is in no acute distress. Patient is alert and oriented.  height is 5\' 9"  (1.753 m) and weight is 186 lb (84.4 kg). His temperature is 97.7 F (36.5 C). His blood pressure is 129/92 (abnormal) and his pulse is 112 (abnormal). His respiration is 20 and oxygen saturation is 100%.  Lungs are clear to auscultation bilaterally. Heart has regular rate and rhythm. No palpable cervical, supraclavicular, or axillary adenopathy. Abdomen soft, non-tender, normal bowel sounds.  Patient continues wear a back brace.  He is ambulating without the assistance of a walker or cane.  On neurological examination motor strength is 5 out of 5 in the proximal and distal muscle groups of the lower extremities.   Lab Findings: Lab Results  Component Value Date   WBC 8.2 05/21/2020   HGB 13.7 05/21/2020   HCT 39.9 05/21/2020   MCV 83.6 05/21/2020   PLT 215 05/21/2020    Radiographic Findings: NM Bone Scan Whole Body  Result Date: 08/13/2020 CLINICAL DATA:  Metastatic prostate cancer to bone, surveillance, rising PSA over last several months with most recent PSA 135 on 05/21/2020. EXAM: NUCLEAR MEDICINE WHOLE BODY BONE SCAN TECHNIQUE: Whole body anterior and posterior images were obtained approximately 3 hours after intravenous injection of radiopharmaceutical. RADIOPHARMACEUTICALS:  21.8 mCi Technetium-78m MDP IV COMPARISON:  08/28/2019 Radiographic correlation: None recent FINDINGS: Motion artifacts at head, for which the  attic images were performed. Foci of abnormal tracer uptake are seen in the calvaria, a few BILATERAL ribs, at T10, L2, and in RIGHT ischium consistent with osseous metastases. The sites at T10, L2, and a single posterior RIGHT rib were seen previously, remaining sites new. Expected urinary tract and soft tissue distribution of tracer. IMPRESSION: Progressive osseous metastatic disease as above. Electronically Signed   By: Lavonia Dana M.D.   On: 08/13/2020 09:53     Impression: Prostate cancer metastatic to bone  Recent bone scan showed progressive osseous metastatic disease.  I reviewed the patient's bone scan images  with him this afternoon.  In reviewing his previous blood work his PSA in late November had jumped up to 135 ng/mL.  Previous measurement 3 months prior was 21.2.  I discussed with the patient that his prostate cancer seems to be becoming more active based on bone scan and PSA findings.  At this point there do not seem to be any significant osseous mets that warrant palliative radiation therapy.  Plan: Patient will follow up with Dr. Alen Blew next month with blood work prior to that visit.  It is possible that there may be a change in his systemic management.  Total time spent in this encounter was 15 minutes which included reviewing the patient's most recent bone scan, physical examination, and documentation and ordering of bone scan.  -----------------------------------  Blair Promise, PhD, MD  This document serves as a record of services personally performed by Gery Pray, MD. It was created on his behalf by Clerance Lav, a trained medical scribe. The creation of this record is based on the scribe's personal observations and the provider's statements to them. This document has been checked and approved by the attending provider.

## 2020-08-14 NOTE — Progress Notes (Signed)
Diastolic pressure elevated most likely related to chronic pain. Weight stable. Back brace noted. Reports constant right shoulder pain 8 on a scale of 0-10 worse with movement. Patient reports this pain increased after a fall. Denies headache, dizziness, nausea or vomiting. Reports managing constipation associated with pain medication by using miralax. Denies dysuria, hematuria, urinary leakage or incontinence. Reports bilateral intermittent leg cramps. Denies numbness or tingling in arms or legs. Reports taking xtandi as prescribed.   BP (!) 129/92 (BP Location: Left Arm, Patient Position: Sitting, Cuff Size: Normal)   Pulse (!) 112   Temp 97.7 F (36.5 C)   Resp 20   Ht 5\' 9"  (1.753 m)   Wt 186 lb (84.4 kg)   SpO2 100%   BMI 27.47 kg/m  Wt Readings from Last 3 Encounters:  08/14/20 186 lb (84.4 kg)  08/04/20 186 lb 6.4 oz (84.6 kg)  05/21/20 188 lb 4.8 oz (85.4 kg)

## 2020-08-18 ENCOUNTER — Other Ambulatory Visit: Payer: Self-pay | Admitting: Radiation Oncology

## 2020-08-18 ENCOUNTER — Telehealth: Payer: Self-pay | Admitting: *Deleted

## 2020-08-18 DIAGNOSIS — C7989 Secondary malignant neoplasm of other specified sites: Secondary | ICD-10-CM

## 2020-08-18 MED ORDER — OXYCODONE HCL 10 MG PO TABS: 20.0000 mg | ORAL_TABLET | Freq: Every day | ORAL | 0 refills | Status: DC

## 2020-08-18 NOTE — Telephone Encounter (Signed)
Patient called requesting refill of his Oxycodone to be sent to [facility].  States he is going out of town [place] and hopes to get it filled before he leaves.  Routed to Dr. Sondra Come

## 2020-09-02 ENCOUNTER — Inpatient Hospital Stay: Payer: Medicare Other | Attending: Oncology

## 2020-09-02 ENCOUNTER — Inpatient Hospital Stay: Payer: Medicare Other | Admitting: Oncology

## 2020-09-09 ENCOUNTER — Telehealth: Payer: Self-pay | Admitting: *Deleted

## 2020-09-09 ENCOUNTER — Other Ambulatory Visit: Payer: Self-pay | Admitting: Radiation Oncology

## 2020-09-09 MED ORDER — OXYCODONE HCL ER 20 MG PO T12A: 20.0000 mg | EXTENDED_RELEASE_TABLET | Freq: Two times a day (BID) | ORAL | 0 refills | Status: DC

## 2020-09-09 NOTE — Telephone Encounter (Signed)
Patient called requesting refill of his Oxycontin to [facility].  Routed to Dr Sondra Come to please review.

## 2020-09-10 ENCOUNTER — Telehealth: Payer: Self-pay | Admitting: Emergency Medicine

## 2020-09-10 ENCOUNTER — Other Ambulatory Visit: Payer: Self-pay | Admitting: Radiation Oncology

## 2020-09-10 MED ORDER — XTAMPZA ER 18 MG PO C12A: 18.0000 mg | EXTENDED_RELEASE_CAPSULE | Freq: Two times a day (BID) | ORAL | 0 refills | Status: DC

## 2020-09-15 ENCOUNTER — Telehealth: Payer: Self-pay | Admitting: *Deleted

## 2020-09-15 ENCOUNTER — Other Ambulatory Visit: Payer: Self-pay | Admitting: Radiation Oncology

## 2020-09-15 DIAGNOSIS — C7989 Secondary malignant neoplasm of other specified sites: Secondary | ICD-10-CM

## 2020-09-15 DIAGNOSIS — C801 Malignant (primary) neoplasm, unspecified: Secondary | ICD-10-CM

## 2020-09-15 MED ORDER — OXYCODONE HCL 10 MG PO TABS: 20.0000 mg | ORAL_TABLET | Freq: Every day | ORAL | 0 refills | Status: DC

## 2020-09-15 NOTE — Telephone Encounter (Signed)
Patient called requesting refill of his Oxycodone 10 mg (take 2 tablets) to be refilled to [facility].  Patient states he doesn't have enough to get through the day as he ended up taking an additional tablet a couple days.  Routed to Dr Sondra Come.

## 2020-09-16 ENCOUNTER — Other Ambulatory Visit: Payer: Self-pay | Admitting: *Deleted

## 2020-09-16 ENCOUNTER — Telehealth: Payer: Self-pay | Admitting: *Deleted

## 2020-09-16 DIAGNOSIS — C7989 Secondary malignant neoplasm of other specified sites: Secondary | ICD-10-CM

## 2020-09-16 NOTE — Telephone Encounter (Signed)
Received call back from [employer].  Order to be placed DME with comments provided.  Fax order to [phone] with demographics sheet.  Completed

## 2020-09-16 NOTE — Addendum Note (Signed)
Addended by: Wilmon Arms on: 09/16/2020 10:49 AM   Modules accepted: Orders

## 2020-09-16 NOTE — Telephone Encounter (Signed)
Spoke with patient and the company he is trying to get back brace from is [employer] on [address].

## 2020-09-16 NOTE — Telephone Encounter (Signed)
Patient called requesting a order for a new back brace stating that his is tearing and the place he got the previous one ordered through is stating he needs a new referral.  Chart reviewed  And do not see where we ordered back brace previously.  Attempted to call patient to ask name of company.  Routed to Dr Sondra Come to review and advise and order if appropriate.

## 2020-09-16 NOTE — Telephone Encounter (Signed)
Dr. Sondra Come is agreeable to order back brace with [employer].  Called them [phone].  They are closed this week for training and will reopen next week.  They will call back with details that are required for order for back brace and how best to get order to them.  Pending call back from [employer].

## 2020-09-18 ENCOUNTER — Telehealth: Payer: Self-pay | Admitting: *Deleted

## 2020-09-18 ENCOUNTER — Telehealth: Payer: Self-pay | Admitting: Oncology

## 2020-09-18 NOTE — Telephone Encounter (Signed)
[facility] needed clinical documentation to support back brace.  Faxed documents to [phone].

## 2020-09-18 NOTE — Telephone Encounter (Signed)
Scheduled appointment per 3/22 sch msg. Pt aware.

## 2020-09-23 ENCOUNTER — Telehealth: Payer: Self-pay

## 2020-09-23 ENCOUNTER — Other Ambulatory Visit (HOSPITAL_COMMUNITY): Payer: Self-pay

## 2020-09-23 NOTE — Telephone Encounter (Signed)
[facility] called to let me know they have been unable to reach patient about his Xtandi refill. The last time it was filled was 05/13/20. I have also attempted to reach patient several times.  I was able to reach him in February and we refilled the [place], but it was returned to stock because he never picked up.  [facility] Phone [phone] Fax [phone] 09/23/2020 10:21 AM

## 2020-10-01 ENCOUNTER — Inpatient Hospital Stay: Payer: Medicare Other | Attending: Oncology | Admitting: Oncology

## 2020-10-01 ENCOUNTER — Telehealth: Payer: Self-pay | Admitting: *Deleted

## 2020-10-01 ENCOUNTER — Inpatient Hospital Stay: Payer: Medicare Other

## 2020-10-01 DIAGNOSIS — Z5111 Encounter for antineoplastic chemotherapy: Secondary | ICD-10-CM | POA: Insufficient documentation

## 2020-10-01 DIAGNOSIS — C61 Malignant neoplasm of prostate: Secondary | ICD-10-CM | POA: Insufficient documentation

## 2020-10-01 DIAGNOSIS — C7951 Secondary malignant neoplasm of bone: Secondary | ICD-10-CM | POA: Insufficient documentation

## 2020-10-01 NOTE — Telephone Encounter (Signed)
Phone call to patient regarding missed lab & MD appointments today, no answer, left message for him to call Dr. Hazeline Junker office.  [phone]

## 2020-10-02 IMAGING — MR MR THORACIC SPINE W/O CM
7 of 9 series · 31 of 48 positions shown · non-contrast
Comparison: MRI thoracic spine 12/15/2018

CLINICAL DATA: Metastatic prostate cancer. History of T12 tumor
ablation and kyphoplasty.

EXAM:
MRI THORACIC SPINE WITHOUT CONTRAST
TECHNIQUE: Multiplanar, multisequence MR imaging of the thoracic spine was
performed. No intravenous contrast was administered.

[Series 16: T1 · sagittal · 4.0mm · 1.72mm/px · 1 of 5 slices shown (1 of 3)]
[im 1/5]
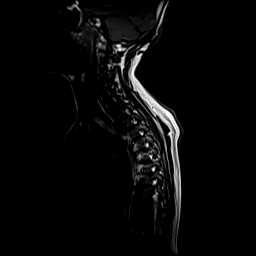

[Series 17: STIR · sagittal · 3.0mm · 1.00mm/px · 2 of 15 slices shown]
[im 1/15]
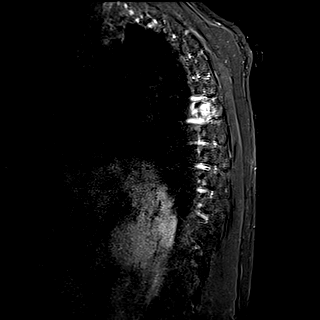
[im 15/15]
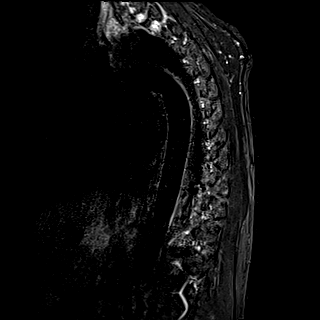

[Series 18: T1 · sagittal · 3.0mm · 1.00mm/px · 2 of 15 slices shown (2 of 3)]
[im 1/15]
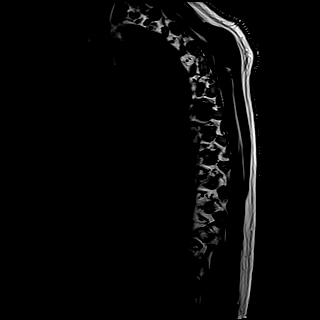
[im 15/15]
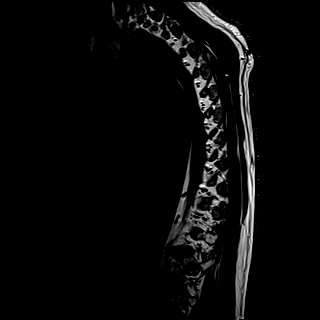

[Series 19: T2 post-contrast · sagittal · 3.0mm · 0.83mm/px · 2 of 15 slices shown]
[im 1/15]
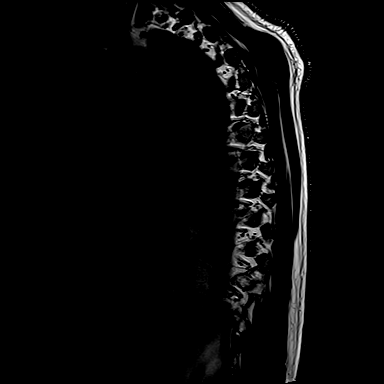
[im 15/15]
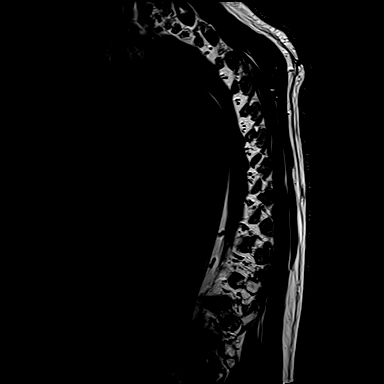

[Series 20: T2 · axial · 4.0mm · 0.78mm/px · z∈[-304,-81]mm · 8 of 51 slices shown]
[im 1/51]
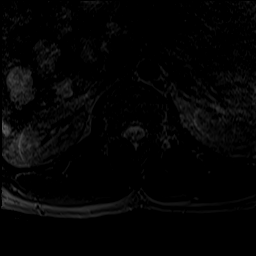
[im 8/51]
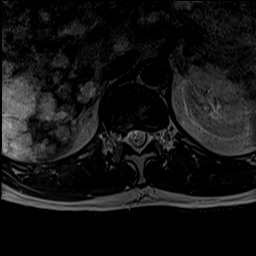
[im 15/51]
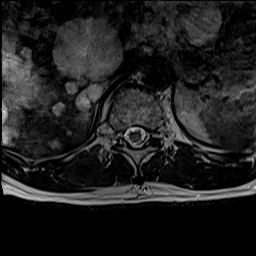
[im 22/51]
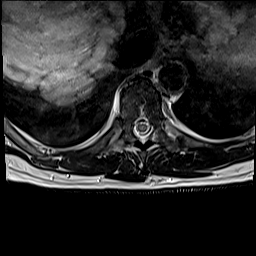
[im 29/51]
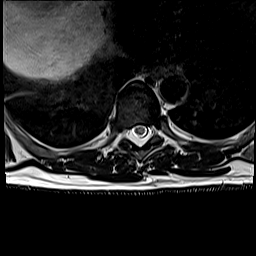
[im 36/51]
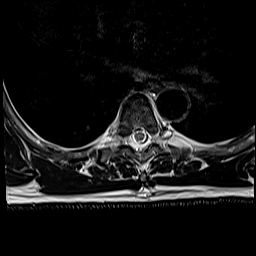
[im 43/51]
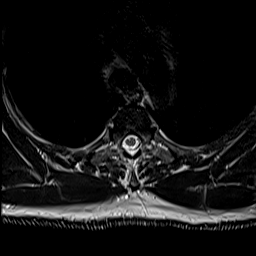
[im 51/51]
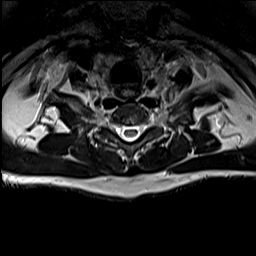

[Series 21: t2_me2d_tra · axial · 4.0mm · 0.52mm/px · z∈[-304,-81]mm · 8 of 51 slices shown]
[im 1/51]
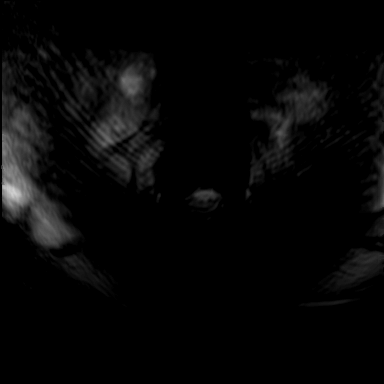
[im 8/51]
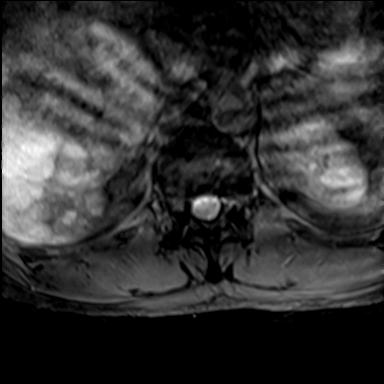
[im 15/51]
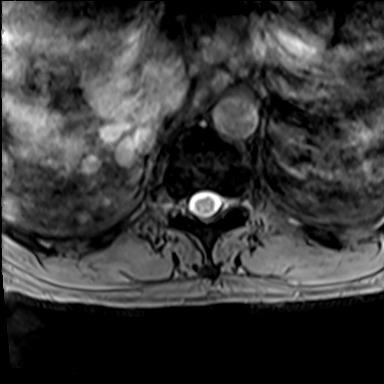
[im 22/51]
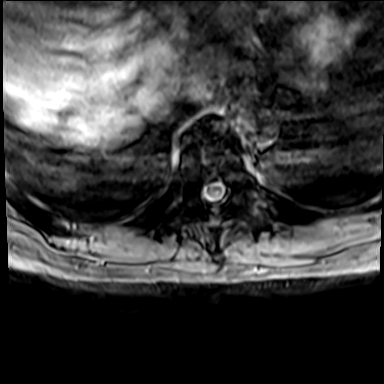
[im 29/51]
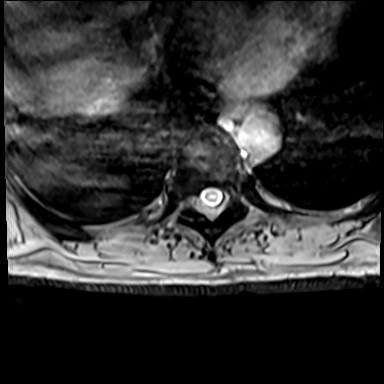
[im 36/51]
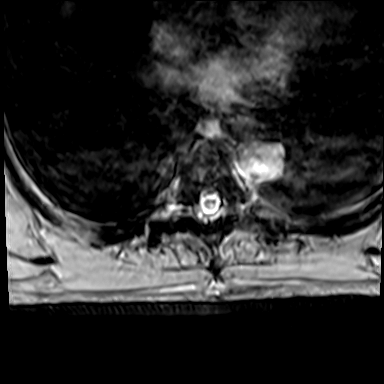
[im 43/51]
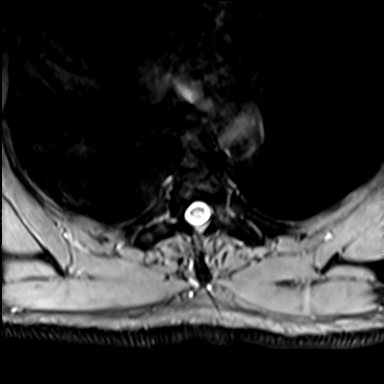
[im 51/51]
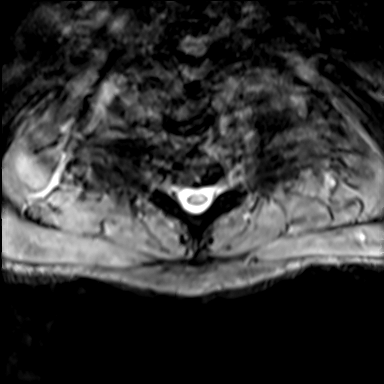

[Series 22: T1 · axial · 4.0mm · 0.39mm/px · z∈[-304,-81]mm · 8 of 51 slices shown (3 of 3)]
[im 1/51]
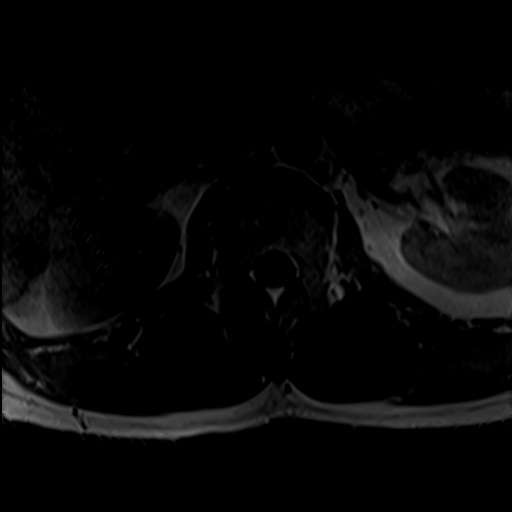
[im 8/51]
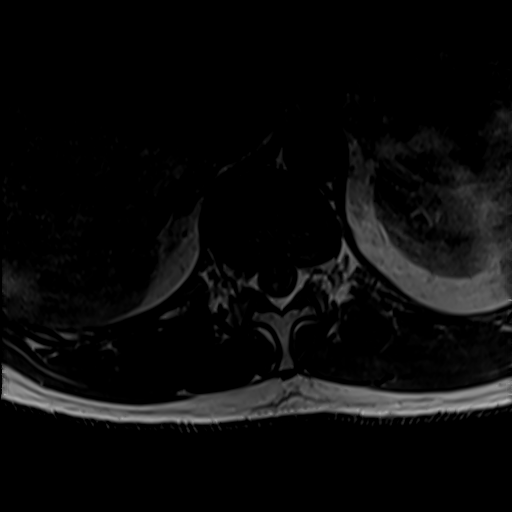
[im 15/51]
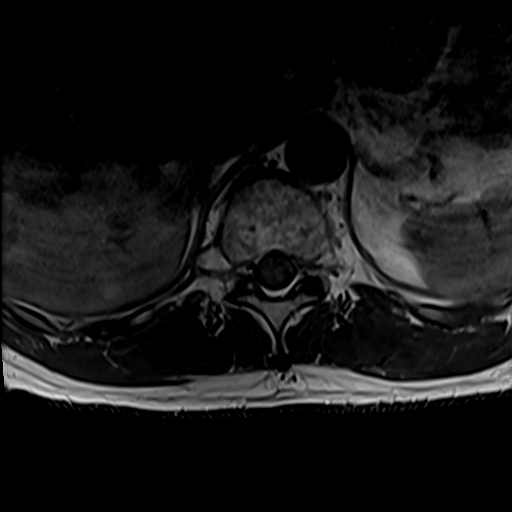
[im 22/51]
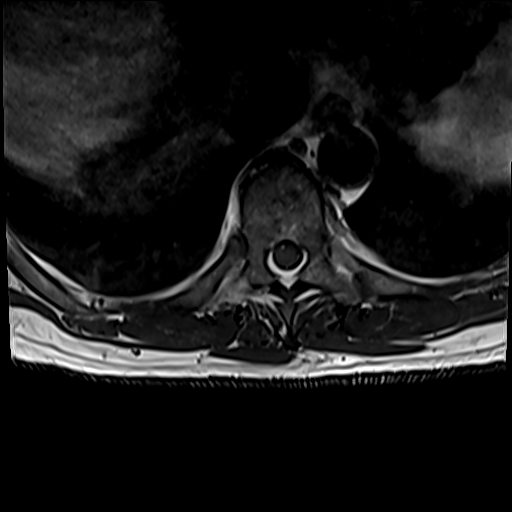
[im 29/51]
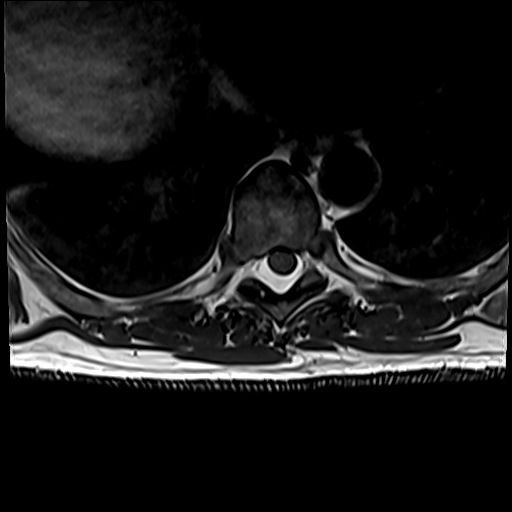
[im 36/51]
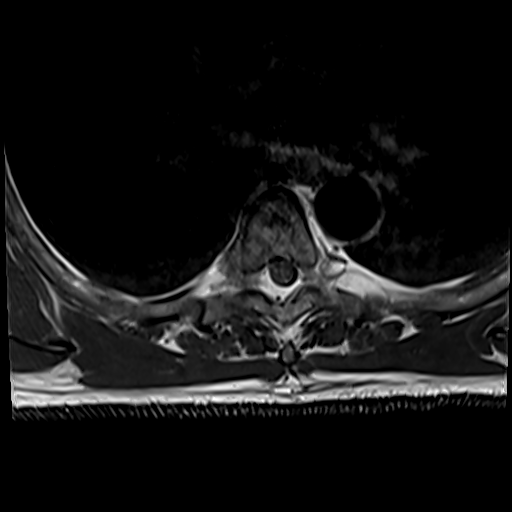
[im 43/51]
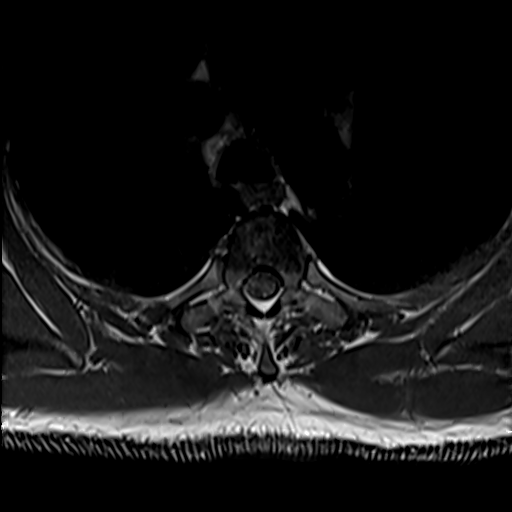
[im 51/51]
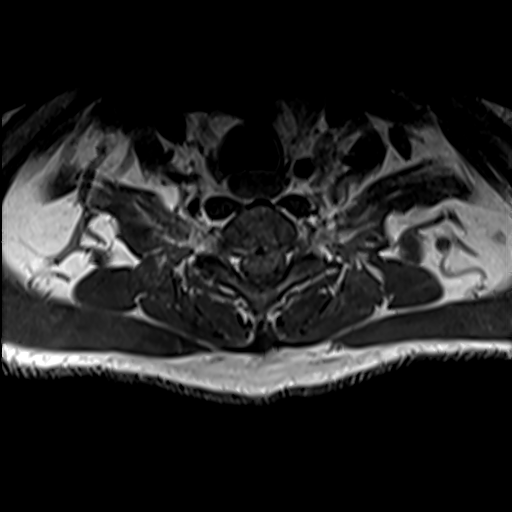

[31 of 48 positions shown; findings below may reference images not displayed]

FINDINGS: Alignment:  Normal

Vertebrae: Treated tumor T12 vertebral body with ablation and
kyphoplasty is stable. There is a pathologic fracture T12 which is
unchanged. There is epidural cement bilaterally with mild spinal
stenosis unchanged from the prior study. Mild cord deformity. Post
radiation bone marrow changes at T11,T12 and L1 unchanged

No new metastatic deposits.  No new fracture identified.

Cord: Cord evaluation limited by motion and artifact. No consistent
cord signal abnormality identified

Paraspinal and other soft tissues: Negative for paraspinous mass or
adenopathy. Numerous hepatic cysts.

Disc levels:

Mild disc degeneration in the midthoracic spine with disc space
narrowing and disc desiccation. No significant spinal or foraminal
stenosis at these levels.
IMPRESSION: Stable MRI.

Treated metastatic disease at T12 is unchanged from prior study. No
new metastatic deposits.

## 2020-10-03 ENCOUNTER — Telehealth: Payer: Self-pay | Admitting: Oncology

## 2020-10-03 NOTE — Telephone Encounter (Signed)
Per sch msg, r/s missed appt on 4/6. Called and spoke with patient. Confirmed new date and time

## 2020-10-08 ENCOUNTER — Telehealth: Payer: Self-pay

## 2020-10-08 ENCOUNTER — Other Ambulatory Visit: Payer: Self-pay | Admitting: Radiation Oncology

## 2020-10-08 MED ORDER — XTAMPZA ER 18 MG PO C12A: 18.0000 mg | EXTENDED_RELEASE_CAPSULE | Freq: Two times a day (BID) | ORAL | 0 refills | Status: DC

## 2020-10-08 NOTE — Telephone Encounter (Signed)
Patient called in requesting refill on Oxycodone ER 18mg . Patient states  He will be going out of town on Friday. Last filled on  09/10/20 #60.  Requesting prescription to be sent to [facility].

## 2020-10-13 ENCOUNTER — Other Ambulatory Visit (HOSPITAL_COMMUNITY): Payer: Self-pay

## 2020-10-13 MED FILL — Enzalutamide Tab 80 MG: ORAL | 30 days supply | Qty: 60 | Fill #0 | Status: AC

## 2020-10-14 ENCOUNTER — Other Ambulatory Visit: Payer: Self-pay | Admitting: Radiation Oncology

## 2020-10-14 ENCOUNTER — Other Ambulatory Visit (HOSPITAL_COMMUNITY): Payer: Self-pay

## 2020-10-14 ENCOUNTER — Telehealth: Payer: Self-pay | Admitting: Radiology

## 2020-10-14 DIAGNOSIS — C7989 Secondary malignant neoplasm of other specified sites: Secondary | ICD-10-CM

## 2020-10-14 MED ORDER — OXYCODONE HCL 10 MG PO TABS: 20.0000 mg | ORAL_TABLET | Freq: Every day | ORAL | 0 refills | Status: DC

## 2020-10-14 NOTE — Telephone Encounter (Signed)
Patient requests a refill of oxycodone 10mg .

## 2020-10-15 ENCOUNTER — Inpatient Hospital Stay: Payer: Medicare Other

## 2020-10-15 ENCOUNTER — Inpatient Hospital Stay (HOSPITAL_BASED_OUTPATIENT_CLINIC_OR_DEPARTMENT_OTHER): Payer: Medicare Other | Admitting: Oncology

## 2020-10-15 ENCOUNTER — Other Ambulatory Visit: Payer: Self-pay

## 2020-10-15 VITALS — BP 146/81 | HR 76 | Temp 97.5°F | Resp 18 | Ht 69.0 in | Wt 186.6 lb

## 2020-10-15 DIAGNOSIS — C7951 Secondary malignant neoplasm of bone: Secondary | ICD-10-CM | POA: Diagnosis not present

## 2020-10-15 DIAGNOSIS — C61 Malignant neoplasm of prostate: Secondary | ICD-10-CM | POA: Diagnosis not present

## 2020-10-15 DIAGNOSIS — Z5111 Encounter for antineoplastic chemotherapy: Secondary | ICD-10-CM | POA: Diagnosis not present

## 2020-10-15 LAB — CMP (CANCER CENTER ONLY)
ALT: 10 U/L (ref 0–44)
AST: 19 U/L (ref 15–41)
Albumin: 3.3 g/dL — ABNORMAL LOW (ref 3.5–5.0)
Alkaline Phosphatase: 184 U/L — ABNORMAL HIGH (ref 38–126)
Anion gap: 9 (ref 5–15)
BUN: 15 mg/dL (ref 6–20)
CO2: 22 mmol/L (ref 22–32)
Calcium: 9.2 mg/dL (ref 8.9–10.3)
Chloride: 111 mmol/L (ref 98–111)
Creatinine: 1.33 mg/dL — ABNORMAL HIGH (ref 0.61–1.24)
GFR, Estimated: >60 mL/min (ref >60)
Glucose, Bld: 133 mg/dL — ABNORMAL HIGH (ref 70–99)
Potassium: 3.9 mmol/L (ref 3.5–5.1)
Sodium: 142 mmol/L (ref 135–145)
Total Bilirubin: 0.3 mg/dL (ref 0.3–1.2)
Total Protein: 7.9 g/dL (ref 6.5–8.1)

## 2020-10-15 LAB — CBC WITH DIFFERENTIAL (CANCER CENTER ONLY)
Abs Immature Granulocytes: 0.76 10*3/uL — ABNORMAL HIGH (ref 0.00–0.07)
Basophils Absolute: 0.1 10*3/uL (ref 0.0–0.1)
Basophils Relative: 1 %
Eosinophils Absolute: 0.3 10*3/uL (ref 0.0–0.5)
Eosinophils Relative: 3 %
HCT: 36.7 % — ABNORMAL LOW (ref 39.0–52.0)
Hemoglobin: 12.8 g/dL — ABNORMAL LOW (ref 13.0–17.0)
Immature Granulocytes: 8 %
Lymphocytes Relative: 21 %
Lymphs Abs: 2 10*3/uL (ref 0.7–4.0)
MCH: 27.8 pg (ref 26.0–34.0)
MCHC: 34.9 g/dL (ref 30.0–36.0)
MCV: 79.8 fL — ABNORMAL LOW (ref 80.0–100.0)
Monocytes Absolute: 0.5 10*3/uL (ref 0.1–1.0)
Monocytes Relative: 5 %
Neutro Abs: 5.7 10*3/uL (ref 1.7–7.7)
Neutrophils Relative %: 62 %
Platelet Count: 167 10*3/uL (ref 150–400)
RBC: 4.6 MIL/uL (ref 4.22–5.81)
RDW: 14.4 % (ref 11.5–15.5)
WBC Count: 9.3 10*3/uL (ref 4.0–10.5)
nRBC: 0.3 % — ABNORMAL HIGH (ref 0.0–0.2)

## 2020-10-15 NOTE — Progress Notes (Signed)
Hematology and Oncology Follow Up Visit  Najae Rathert [id number] Sep 17, 1960 60 y.o. 10/15/2020 10:23 AM Sonia Side., FNPSmith, Malva Limes., FNP   Principle Diagnosis: 60 year old man with advanced prostate cancer with disease to the bone diagnosed in 2019.  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    Prior Therapy:  Radiation therapy as follows:  07/26/2018 - 08/08/2018: Rightsuperior glenoid of right shoulder / 30 Gy in 10 fractions of 3 Gy  12/01/2017 - 12/14/2017: 1. Left Scapula / 30 Gy in 10 fractions 2. Lumbar Spine (L4 region) / 30 Gy in 10 fractions  09/16/2017 - 09/29/2017: T Spine / 30 Gy in 10 fractions  He completed 4 weeks of Casodex between April and May 2019.  Zytiga 1000 mg daily started in May 2019. Therapy discontinued in June 2021 because of progression of disease.  Current therapy:  Lupron 30 mg given starting on 10/05/2017.  He will receive Eligard today and repeated in 4 months.  Xtandi 80 mg daily started in June 2021.  Interim History: Mr. Minion is here for a follow-up visit.  Since the last visit, he reports no major changes in his health.  He has missed multiple appointments although he still taking Xtandi per his report.  He denies any nausea or vomiting or abdominal pain.  He does report bone pain despite being on long-acting oxycodone and breakthrough as well.  He denies recent falls or syncope.  He denies any weight loss or appetite changes.      Medications: Unchanged on review. Current Outpatient Medications  Medication Sig Dispense Refill  . enzalutamide (XTANDI) 80 MG tablet TAKE 2 TABLETS BY MOUTH DAILY. 60 tablet 0  . oxyCODONE ER (XTAMPZA ER) 18 MG C12A Take 18 mg by mouth in the morning and at bedtime. 12 hours apart 60 capsule 0  . Oxycodone HCl 10 MG TABS Take 2 tablets (20 mg total) by mouth 6 (six) times daily. PRN Pain. 360 tablet 0  . polyethylene glycol (MIRALAX / GLYCOLAX) packet Take 17 g by  mouth daily. Use OTC Clearax 17 g po daily 30 each 2   No current facility-administered medications for this visit.     Allergies: No Known Allergies     Physical Exam:     Blood pressure (!) 146/81, pulse 76, temperature (!) 97.5 F (36.4 C), temperature source Tympanic, resp. rate 18, height 5\' 9"  (1.753 m), weight 186 lb 9.6 oz (84.6 kg), SpO2 98 %.      ECOG: 1    General appearance: Comfortable appearing without any discomfort Head: Normocephalic without any trauma Oropharynx: Mucous membranes are moist and pink without any thrush or ulcers. Eyes: Pupils are equal and round reactive to light. Lymph nodes: No cervical, supraclavicular, inguinal or axillary lymphadenopathy.   Heart:regular rate and rhythm.  S1 and S2 without leg edema. Lung: Clear without any rhonchi or wheezes.  No dullness to percussion. Abdomin: Soft, nontender, nondistended with good bowel sounds.  No hepatosplenomegaly. Musculoskeletal: No joint deformity or effusion.  Full range of motion noted. Neurological: No deficits noted on motor, sensory and deep tendon reflex exam. Skin: No petechial rash or dryness.  Appeared moist.             Lab Results: Lab Results  Component Value Date   WBC 9.3 10/15/2020   HGB 12.8 (L) 10/15/2020   HCT 36.7 (L) 10/15/2020   MCV 79.8 (L) 10/15/2020   PLT 167 10/15/2020  Chemistry      Component Value Date/Time   NA 138 05/21/2020 0922   K 4.1 05/21/2020 0922   CL 111 05/21/2020 0922   CO2 20 (L) 05/21/2020 0922   BUN 21 (H) 05/21/2020 0922   CREATININE 1.18 05/21/2020 0922      Component Value Date/Time   CALCIUM 8.9 05/21/2020 0922   ALKPHOS 148 (H) 05/21/2020 0922   AST 18 05/21/2020 0922   ALT 14 05/21/2020 0922   BILITOT 0.3 05/21/2020 0922         Results for AUDRA, BELLARD (MRN [id number]) as of 10/15/2020 10:25  Ref. Range 02/20/2020 07:54 05/21/2020 09:22  Prostate Specific Ag, Serum Latest Ref Range: 0.0 - 4.0 ng/mL  21.2 (H) 135.0 (H)   FINDINGS: Motion artifacts at head, for which the attic images were performed.  Foci of abnormal tracer uptake are seen in the calvaria, a few BILATERAL ribs, at T10, L2, and in RIGHT ischium consistent with osseous metastases.  The sites at T10, L2, and a single posterior RIGHT rib were seen previously, remaining sites new.  Expected urinary tract and soft tissue distribution of tracer.  IMPRESSION: Progressive osseous metastatic disease as above.   Impression and Plan:  60 year old man with  1.  Advanced prostate cancer with disease to the bone and lymphadenopathy diagnosed in March 2019.  He has castration-resistant at this time.   His disease status was updated at this time and treatment options were reviewed.  Bone scan obtained in February 2022 showed disease progression with PSA is up to 135 in November 2021.  These options include systemic chemotherapy, Xofigo as well as potentially PARP inhibitor if he harbors the appropriate mutation.  Risks and benefits of all these approaches were discussed.  Given his extensive disease to the bone he would benefit from [place] at this time and he opted against systemic chemotherapy.  I will make the appropriate referral to Dr. Sondra Come for evaluation regarding Xofigo.  I recommended continuing Xtandi at this time.   2.  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   3.  Bone directed therapy: Delton See has been deferred due to lack of dental clearance.  He is currently on calcium and vitamin D supplements..  4.  Androgen deprivation: I recommended continuing Eligard indefinitely.  He will receive it today and repeated in 4 months.   5.  Prognosis and goals of care: His disease is incurable although aggressive measures are warranted given his reasonable performance status.   6.  Follow-up: He will return in 4 months to update his clinical status.  30 minutes  were spent on this visit.  The time was dedicated to reviewing laboratory data, disease status update, treatment options and future plan of care review.  Zola Button, MD 4/20/202210:23 AM

## 2020-10-16 ENCOUNTER — Inpatient Hospital Stay: Payer: Medicare Other

## 2020-10-16 VITALS — BP 128/81 | HR 93 | Temp 98.6°F | Resp 18

## 2020-10-16 DIAGNOSIS — C7951 Secondary malignant neoplasm of bone: Secondary | ICD-10-CM

## 2020-10-16 DIAGNOSIS — Z5111 Encounter for antineoplastic chemotherapy: Secondary | ICD-10-CM | POA: Diagnosis not present

## 2020-10-16 DIAGNOSIS — C61 Malignant neoplasm of prostate: Secondary | ICD-10-CM

## 2020-10-16 LAB — PROSTATE-SPECIFIC AG, SERUM (LABCORP): Prostate Specific Ag, Serum: 1873 ng/mL — ABNORMAL HIGH (ref 0.0–4.0)

## 2020-10-16 MED ORDER — LEUPROLIDE ACETATE (4 MONTH) 30 MG ~~LOC~~ KIT
PACK | SUBCUTANEOUS | Status: AC
  Filled 2020-10-16: qty 30

## 2020-10-16 MED ORDER — LEUPROLIDE ACETATE (4 MONTH) 30 MG ~~LOC~~ KIT
30.0000 mg | PACK | Freq: Once | SUBCUTANEOUS | Status: AC
  Administered 2020-10-16: 30 mg via SUBCUTANEOUS

## 2020-10-16 NOTE — Patient Instructions (Signed)

## 2020-10-16 NOTE — Progress Notes (Signed)
Histology and Location of Primary Cancer: prostate  Sites of Visceral and Bony Metastatic Disease:  BILATERAL ribs, at T10, L2, and in RIGHT ischium consistent with osseous metastases  Location(s) of Symptomatic Metastases: left shoulder and upper back  Past/Anticipated chemotherapy by medical oncology, if any:     Pain on a scale of 0-10 is: 8/10 to left shoulder and upper back.   If Spine Met(s), symptoms, if any, include:  Bowel/Bladder retention or incontinence (please describe): Denies bowel or bladder symptoms  Numbness or weakness in extremities (please describe): Denies numbness or weakness  Current Decadron regimen, if applicable: no  Ambulatory status? Walker? Wheelchair?: Ambulatory  SAFETY ISSUES:  Prior radiation? yes,     Pacemaker/ICD? no  Possible current pregnancy? no  Is the patient on methotrexate? no  Current Complaints / other details:  Right leg pain/cramping during the night. Tenderness in low back at site of previous surgery.   Vitals:   10/22/20 0758  BP: 112/73  Pulse: 78  Resp: 18  Temp: (!) 97 F (36.1 C)  TempSrc: Temporal  SpO2: 100%  Weight: 180 lb 6 oz (81.8 kg)  Height: $Remove'5\' 9"'fugchMU$  (1.753 m)

## 2020-10-22 ENCOUNTER — Telehealth: Payer: Self-pay | Admitting: *Deleted

## 2020-10-22 ENCOUNTER — Encounter: Payer: Self-pay | Admitting: Radiation Oncology

## 2020-10-22 ENCOUNTER — Other Ambulatory Visit: Payer: Self-pay

## 2020-10-22 ENCOUNTER — Encounter: Payer: Self-pay | Admitting: Licensed Clinical Social Worker

## 2020-10-22 ENCOUNTER — Ambulatory Visit
Admission: RE | Admit: 2020-10-22 | Discharge: 2020-10-22 | Disposition: A | Discharge status: Home / Self Care | Payer: Medicare Other | Source: Ambulatory Visit | Attending: Radiation Oncology | Admitting: Radiation Oncology

## 2020-10-22 ENCOUNTER — Other Ambulatory Visit (HOSPITAL_COMMUNITY): Payer: Self-pay | Admitting: Radiation Oncology

## 2020-10-22 VITALS — BP 112/73 | HR 78 | Temp 97.0°F | Resp 18 | Ht 69.0 in | Wt 180.4 lb

## 2020-10-22 DIAGNOSIS — R599 Enlarged lymph nodes, unspecified: Secondary | ICD-10-CM | POA: Diagnosis not present

## 2020-10-22 DIAGNOSIS — C7989 Secondary malignant neoplasm of other specified sites: Secondary | ICD-10-CM

## 2020-10-22 DIAGNOSIS — C7951 Secondary malignant neoplasm of bone: Secondary | ICD-10-CM | POA: Diagnosis not present

## 2020-10-22 DIAGNOSIS — C801 Malignant (primary) neoplasm, unspecified: Secondary | ICD-10-CM

## 2020-10-22 DIAGNOSIS — Z923 Personal history of irradiation: Secondary | ICD-10-CM | POA: Insufficient documentation

## 2020-10-22 DIAGNOSIS — C61 Malignant neoplasm of prostate: Secondary | ICD-10-CM | POA: Insufficient documentation

## 2020-10-22 DIAGNOSIS — F17210 Nicotine dependence, cigarettes, uncomplicated: Secondary | ICD-10-CM | POA: Diagnosis not present

## 2020-10-22 NOTE — Treatment Plan (Signed)
  Radiation Oncology         [phone] ________________________________  Name: Jonathan Campbell MRN: [id number]  Date: 10/22/2020  DOB: 1960-08-30  Xofigo Treatment Planning Note:  Diagnosis:  Castration resistant prostate cancer with painful bone involvement  Narrative: [name] is a patient who has been diagnosed with castration resistant prostate cancer with painful bone involvement.  His most recent blood counts show that he remains a good candidate to proceed with Ra-223.  The patient is going to receive Xofigo for his treatment.   Radiation Treatment Planning:  The prescribed radiation activity will be 50 kBq per kg per infusions. The plan is to offer a total of 6 IV administrations of this agent, assuming the blood counts are adequate prior to each administration, with each infusion done at 4 week intervals.  This will be done as an IV administration in the nuclear medicine department, with care to undertake all radiation protection precautions as recommended.    -----------------------------------  Blair Promise, PhD, MD

## 2020-10-22 NOTE — Telephone Encounter (Signed)
CALLED PATIENT TO INFORM OF LAB AND WEIGHT FOR 10-28-20 @ 12:15 PM @ [place]. ON 11-04-20 - ARRIVAL TIME - 12 PM @ [facility], SPOKE WITH PATIENT AND HE IS AWARE OF THESE APPTS.

## 2020-10-22 NOTE — Progress Notes (Signed)
[place] Psychosocial Distress Screening Clinical Social Work  Clinical Social Work was referred by distress screening protocol.  The patient scored a 5 on the Psychosocial Distress Thermometer which indicates moderate distress. Clinical Social Worker contacted patient by phone to assess for distress and other psychosocial needs.  Patient reports needing help getting to appointments. He has previously used [facility] transportation, but no longer has their number. CSW provided contact information for transportation department. Also informed patient that, for non-[facility] appointments, he can try to utilize transportation through [place].  Pt expressed understanding. No other needs voiced at this time.  ONCBCN DISTRESS SCREENING 10/22/2020  Screening Type Initial Screening  Distress experienced in past week (1-10) 5  Practical problem type Transportation  Physical Problem type Pain  Physician notified of physical symptoms Yes  Referral to clinical social work Yes   Clinical Social Worker follow up needed: No.  If yes, follow up plan:  Jonathan Campbell [name], LCSW

## 2020-10-22 NOTE — Treatment Plan (Signed)
  Radiation Oncology         [phone]) [phone] ________________________________  Name: Jonathan Campbell MRN: [id number]  Date: 10/22/2020  DOB: 11/28/1960   To whom it may concern:   Jonathan Campbell is a patient with metastatic, castrate resistant prostate cancer with predominantly bone disease.  For details of his clinical history, please see the previously detailed consultation report that can be supplied with this letter.  I have recommended Radium 223 Dichloride treatment for monthly IV infusion over six months.  Based upon the randomized phase III Alsympca trial, we would expect an improvement in the patient's overall survival, improved pain control, decreased narcotic requirements, and decreased probability of skeletal related adverse events moving forward.  Given the data and this patient's situation, I feel that the delivery of Trudi Ida is medical necessity.  Please call if any further information is required to aid in this patient's treatment.    Sincerely yours,  Blair Promise, PhD, MD  -----------------------------------------------------------------------------------------------------------  Ref:  Zack Seal, Diannia Ruder, et al., Alpha emitter radium-223 and survival in metastatic prostate cancer. [place] Med. 2013 Jul 18;369(3):213-23

## 2020-10-22 NOTE — Progress Notes (Signed)
See MD note for nursing evaluation. °

## 2020-10-22 NOTE — Progress Notes (Signed)
Radiation Oncology         [phone] ________________________________  Re-Consultation Note  Name: Jonathan Campbell MRN: [id number]  Date: 10/22/2020  DOB: 1961-03-27  NA:TFTDD, Malva Limes., FNP  Wyatt Portela, MD   REFERRING PHYSICIAN: Wyatt Portela, MD  DIAGNOSIS: The primary encounter diagnosis was Prostate cancer metastatic to bone [facility]). 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  Prostate cancer metastatic to bone   HISTORY OF PRESENT ILLNESS::Jonathan Campbell is a 60 y.o. male who is well known to me after being treated for metastatic prostate cancer in 2019 and 2020. He was last seen in follow-up on 08/14/2020, at which time bone scan showed progressive osseous metastatic disease. However, there were no significant osseous mets that warranted palliative radiation therapy.   Since his last visit, he was seen by Dr. Alen Blew on 10/15/2020. Given his progression on recent bone scans, options were discussed including systemic chemotherapy and consideration for Xofigo.  the patient opted against systemic chemotherapy but wished to be considered for Xofigo. He was referred back to radiation therapy for further evaluation.  PREVIOUS RADIATION THERAPY: Yes  07/26/2018 - 08/08/2018: Rightsuperior glenoid of right shoulder / 30 Gy in 10 fractions of 3 Gy  12/01/2017 - 12/14/2017: 1. Left Scapula / 30 Gy in 10 fractions 2. Lumbar Spine (L4 region) / 30 Gy in 10 fractions  09/16/2017 - 09/29/2017: T Spine / 30 Gy in 10 fractions  PAST MEDICAL HISTORY:  Past Medical History:  Diagnosis Date  . Compression fracture of T12 vertebra (HCC)   . Prostate cancer metastatic to bone [facility])     PAST SURGICAL HISTORY: Past Surgical History:  Procedure Laterality Date  . IR BONE TUMOR(S)RF ABLATION  11/22/2017  . IR KYPHO THORACIC WITH BONE BIOPSY  11/22/2017  . IR RADIOLOGIST EVAL & MGMT  11/09/2017  . NO PAST  SURGERIES      FAMILY HISTORY: No family history on file.  SOCIAL HISTORY:  Social History   Tobacco Use  . Smoking status: Light Tobacco Smoker    Types: Cigars  . Smokeless tobacco: Never Used  Vaping Use  . Vaping Use: Never used  Substance Use Topics  . Alcohol use: Yes    Comment: occ  . Drug use: Yes    Types: Marijuana    Comment: occ    ALLERGIES: No Known Allergies  MEDICATIONS:  Current Outpatient Medications  Medication Sig Dispense Refill  . acetaminophen (TYLENOL) 500 MG tablet Take 500 mg by mouth daily as needed.    . enzalutamide (XTANDI) 80 MG tablet TAKE 2 TABLETS BY MOUTH DAILY. 60 tablet 0  . oxyCODONE ER (XTAMPZA ER) 18 MG C12A Take 18 mg by mouth in the morning and at bedtime. 12 hours apart 60 capsule 0  . Oxycodone HCl 10 MG TABS Take 2 tablets (20 mg total) by mouth 6 (six) times daily. PRN Pain. 360 tablet 0  . polyethylene glycol (MIRALAX / GLYCOLAX) packet Take 17 g by mouth daily. Use OTC Clearax 17 g po daily (Patient not taking: Reported on 10/22/2020) 30 each 2   No current facility-administered medications for this encounter.    REVIEW OF SYSTEMS:  A 10+ POINT REVIEW OF SYSTEMS WAS OBTAINED including neurology, dermatology, psychiatry, cardiac, respiratory, lymph, extremities, GI, GU, musculoskeletal, constitutional, reproductive, HEENT.  Patient continues to have diffuse bone pain but this seems to be reasonably controlled well with his current  pain regimen   PHYSICAL EXAM:  height is 5\' 9"  (1.753 m) and weight is 180 lb 6 oz (81.8 kg). His temporal temperature is 97 F (36.1 C) (abnormal). His blood pressure is 112/73 and his pulse is 78. His respiration is 18 and oxygen saturation is 100%.   General: Alert and oriented, in no acute distress, he continues to wear his back brace HEENT: Head is normocephalic. Extraocular movements are intact. Oropharynx is clear. Neck: Neck is supple, no palpable cervical adenopathy.  Patient does have 3  palpable lymph nodes in the left supraclavicular fossa which I had not appreciated on previous exams. Heart: Regular in rate and rhythm with no murmurs, rubs, or gallops. Chest: Clear to auscultation bilaterally, with no rhonchi, wheezes, or rales. Abdomen: Soft, nontender, nondistended, with no rigidity or guarding. Extremities: No cyanosis or edema. Lymphatics: see Neck Exam Skin: No concerning lesions. Musculoskeletal: symmetric strength and muscle tone throughout. Neurologic: Cranial nerves II through XII are grossly intact. No obvious focalities. Speech is fluent. Coordination is intact. Psychiatric: Judgment and insight are intact. Affect is appropriate.   ECOG = 1  0 - Asymptomatic (Fully active, able to carry on all predisease activities without restriction)  1 - Symptomatic but completely ambulatory (Restricted in physically strenuous activity but ambulatory and able to carry out work of a light or sedentary nature. For example, light housework, office work)  2 - Symptomatic, <50% in bed during the day (Ambulatory and capable of all self care but unable to carry out any work activities. Up and about more than 50% of waking hours)  3 - Symptomatic, >50% in bed, but not bedbound (Capable of only limited self-care, confined to bed or chair 50% or more of waking hours)  4 - Bedbound (Completely disabled. Cannot carry on any self-care. Totally confined to bed or chair)  5 - Death   Eustace Pen MM, Creech RH, Tormey DC, et al. [phone]). "Toxicity and response criteria of the [facility]". Ila Oncol. 5 (6): 649-55  LABORATORY DATA:  Lab Results  Component Value Date   WBC 9.3 10/15/2020   HGB 12.8 (L) 10/15/2020   HCT 36.7 (L) 10/15/2020   MCV 79.8 (L) 10/15/2020   PLT 167 10/15/2020   NEUTROABS 5.7 10/15/2020   Lab Results  Component Value Date   NA 142 10/15/2020   K 3.9 10/15/2020   CL 111 10/15/2020   CO2 22 10/15/2020   GLUCOSE 133 (H)  10/15/2020   CREATININE 1.33 (H) 10/15/2020   CALCIUM 9.2 10/15/2020      RADIOGRAPHY: No results found.    IMPRESSION: Progressive osseous metastatic disease from prostate cancer  The patient will be a good candidate for radium 223 injections as his disease appears to be primarily limited to bone.  On exam today patient was noted to have 3 palpable lymph nodes within the left supraclavicular area.  He has had a significant tobacco use history in the past and therefore we will order a chest CT scan to rule out him having a possible second malignancy in the lung.  PLAN: The patient will be scheduled for radium 223 injections in the near future.  This will be performed in the nuclear medicine department with blood work obtained approximately a week prior to ensure he is a candidate for each injection.  Plan is for him to receive 6 injections approximately 4 weeks apart.  Total time spent in this encounter was 35 minutes which included reviewing the patient's  most recent follow-up with Dr. Alen Blew, physical examination, and documentation.   ------------------------------------------------  Blair Promise, PhD, MD  This document serves as a record of services personally performed by Gery Pray, MD. It was created on his behalf by Clerance Lav, a trained medical scribe. The creation of this record is based on the scribe's personal observations and the provider's statements to them. This document has been checked and approved by the attending provider.

## 2020-10-27 ENCOUNTER — Telehealth: Payer: Self-pay | Admitting: *Deleted

## 2020-10-27 ENCOUNTER — Other Ambulatory Visit: Payer: Self-pay | Admitting: Radiology

## 2020-10-27 DIAGNOSIS — C61 Malignant neoplasm of prostate: Secondary | ICD-10-CM

## 2020-10-27 NOTE — Telephone Encounter (Signed)
CALLED PATIENT TO REMIND OF LAB AND WEIGHT FOR 10-28-20, SPOKE WITH FIANCE MARY DAVIS AND SHE IS AWARE OF THIS APPT.

## 2020-10-28 ENCOUNTER — Telehealth: Payer: Self-pay | Admitting: *Deleted

## 2020-10-28 ENCOUNTER — Ambulatory Visit: Payer: Medicare Other

## 2020-10-28 ENCOUNTER — Ambulatory Visit (HOSPITAL_COMMUNITY): Payer: Medicare Other

## 2020-10-28 NOTE — Telephone Encounter (Signed)
CALLED PATIENT'S SGO - MARY DAVIS TO INFORM THAT LAB AND WEIGHT APPT. HAVE BEEN MOVED TO 11-12-20 @ 12 PM @ [place] MOVED TO 11-19-20- ARRIVAL TIME- 11:45 AM @ [facility], LVM FOR A RETURN CALL

## 2020-10-29 ENCOUNTER — Telehealth: Payer: Self-pay | Admitting: *Deleted

## 2020-10-29 NOTE — Telephone Encounter (Signed)
RETURNED PATIENT'S SGO'S PHONE [place], SPOKE [place] OF PATIENT Ryelan Uher

## 2020-11-04 ENCOUNTER — Telehealth: Payer: Self-pay

## 2020-11-04 ENCOUNTER — Other Ambulatory Visit: Payer: Self-pay | Admitting: Radiation Oncology

## 2020-11-04 ENCOUNTER — Ambulatory Visit (HOSPITAL_COMMUNITY): Admission: RE | Admit: 2020-11-04 | Payer: Medicare Other | Source: Ambulatory Visit

## 2020-11-04 MED ORDER — XTAMPZA ER 18 MG PO C12A: 18.0000 mg | EXTENDED_RELEASE_CAPSULE | Freq: Two times a day (BID) | ORAL | 0 refills | Status: DC

## 2020-11-04 NOTE — Telephone Encounter (Signed)
Patient called in requesting a refill on Oxycodone ER 18mg .  Patient requesting for refill to be sent to [facility] pharmacy.

## 2020-11-06 ENCOUNTER — Other Ambulatory Visit (HOSPITAL_COMMUNITY): Payer: Self-pay

## 2020-11-07 ENCOUNTER — Telehealth: Payer: Self-pay | Admitting: Radiology

## 2020-11-07 ENCOUNTER — Ambulatory Visit: Payer: Medicare Other

## 2020-11-07 NOTE — Telephone Encounter (Signed)
Patient requests refill of oxycodone 10 mg to be filled on 11/10/2020.

## 2020-11-10 ENCOUNTER — Other Ambulatory Visit: Payer: Self-pay | Admitting: Radiation Oncology

## 2020-11-10 ENCOUNTER — Other Ambulatory Visit: Payer: Self-pay | Admitting: Oncology

## 2020-11-10 ENCOUNTER — Other Ambulatory Visit (HOSPITAL_COMMUNITY): Payer: Self-pay

## 2020-11-10 DIAGNOSIS — C7989 Secondary malignant neoplasm of other specified sites: Secondary | ICD-10-CM

## 2020-11-10 DIAGNOSIS — C61 Malignant neoplasm of prostate: Secondary | ICD-10-CM

## 2020-11-10 DIAGNOSIS — C7951 Secondary malignant neoplasm of bone: Secondary | ICD-10-CM

## 2020-11-10 MED ORDER — ENZALUTAMIDE 80 MG PO TABS
ORAL_TABLET | Freq: Every day | ORAL | 0 refills | Status: DC
  Filled 2020-11-10: qty 60, 30d supply, fill #0

## 2020-11-10 MED ORDER — OXYCODONE HCL 10 MG PO TABS: 20.0000 mg | ORAL_TABLET | Freq: Every day | ORAL | 0 refills | Status: DC

## 2020-11-11 ENCOUNTER — Other Ambulatory Visit (HOSPITAL_COMMUNITY): Payer: Self-pay

## 2020-11-12 ENCOUNTER — Ambulatory Visit: Payer: Medicare Other

## 2020-11-14 ENCOUNTER — Ambulatory Visit (HOSPITAL_COMMUNITY): Payer: Medicare Other

## 2020-11-19 ENCOUNTER — Ambulatory Visit (HOSPITAL_COMMUNITY): Admission: RE | Admit: 2020-11-19 | Payer: Medicare Other | Source: Ambulatory Visit

## 2020-11-19 ENCOUNTER — Other Ambulatory Visit (HOSPITAL_COMMUNITY): Payer: Self-pay

## 2020-11-21 ENCOUNTER — Telehealth: Payer: Self-pay | Admitting: *Deleted

## 2020-11-21 NOTE — Telephone Encounter (Signed)
CALLED PATIENT'S SGO- MARY DAVIS TO REMIND OF LAB AND WEIGHT FOR 11-27-20- LAB @ 12 PM AND WEIGHT TO FU, LVM FOR A RETURN CALL

## 2020-11-25 ENCOUNTER — Other Ambulatory Visit (HOSPITAL_COMMUNITY): Payer: Self-pay

## 2020-11-27 ENCOUNTER — Ambulatory Visit
Admission: RE | Admit: 2020-11-27 | Discharge: 2020-11-27 | Disposition: A | Discharge status: Home / Self Care | Payer: Medicare Other | Source: Ambulatory Visit | Attending: Radiation Oncology | Admitting: Radiation Oncology

## 2020-11-27 ENCOUNTER — Other Ambulatory Visit: Payer: Self-pay

## 2020-11-27 ENCOUNTER — Telehealth: Payer: Self-pay | Admitting: *Deleted

## 2020-11-27 DIAGNOSIS — C7951 Secondary malignant neoplasm of bone: Secondary | ICD-10-CM | POA: Diagnosis present

## 2020-11-27 DIAGNOSIS — F17210 Nicotine dependence, cigarettes, uncomplicated: Secondary | ICD-10-CM | POA: Insufficient documentation

## 2020-11-27 DIAGNOSIS — Z79899 Other long term (current) drug therapy: Secondary | ICD-10-CM | POA: Insufficient documentation

## 2020-11-27 DIAGNOSIS — C61 Malignant neoplasm of prostate: Secondary | ICD-10-CM | POA: Insufficient documentation

## 2020-11-27 LAB — CMP (CANCER CENTER ONLY)
ALT: 9 U/L (ref 0–44)
AST: 31 U/L (ref 15–41)
Albumin: 3.5 g/dL (ref 3.5–5.0)
Alkaline Phosphatase: 233 U/L — ABNORMAL HIGH (ref 38–126)
Anion gap: 13 (ref 5–15)
BUN: 12 mg/dL (ref 6–20)
CO2: 21 mmol/L — ABNORMAL LOW (ref 22–32)
Calcium: 9.5 mg/dL (ref 8.9–10.3)
Chloride: 108 mmol/L (ref 98–111)
Creatinine: 1.12 mg/dL (ref 0.61–1.24)
GFR, Estimated: >60 mL/min (ref >60)
Glucose, Bld: 114 mg/dL — ABNORMAL HIGH (ref 70–99)
Potassium: 4 mmol/L (ref 3.5–5.1)
Sodium: 142 mmol/L (ref 135–145)
Total Bilirubin: 0.8 mg/dL (ref 0.3–1.2)
Total Protein: 8.4 g/dL — ABNORMAL HIGH (ref 6.5–8.1)

## 2020-11-27 LAB — CBC WITH DIFFERENTIAL (CANCER CENTER ONLY)
Abs Immature Granulocytes: 1.24 10*3/uL — ABNORMAL HIGH (ref 0.00–0.07)
Basophils Absolute: 0.1 10*3/uL (ref 0.0–0.1)
Basophils Relative: 1 %
Eosinophils Absolute: 0.1 10*3/uL (ref 0.0–0.5)
Eosinophils Relative: 1 %
HCT: 30.2 % — ABNORMAL LOW (ref 39.0–52.0)
Hemoglobin: 10.3 g/dL — ABNORMAL LOW (ref 13.0–17.0)
Immature Granulocytes: 14 %
Lymphocytes Relative: 24 %
Lymphs Abs: 2.2 10*3/uL (ref 0.7–4.0)
MCH: 27.9 pg (ref 26.0–34.0)
MCHC: 34.1 g/dL (ref 30.0–36.0)
MCV: 81.8 fL (ref 80.0–100.0)
Monocytes Absolute: 0.6 10*3/uL (ref 0.1–1.0)
Monocytes Relative: 6 %
Neutro Abs: 5 10*3/uL (ref 1.7–7.7)
Neutrophils Relative %: 54 %
Platelet Count: 124 10*3/uL — ABNORMAL LOW (ref 150–400)
RBC: 3.69 MIL/uL — ABNORMAL LOW (ref 4.22–5.81)
RDW: 16.8 % — ABNORMAL HIGH (ref 11.5–15.5)
WBC Count: 9.1 10*3/uL (ref 4.0–10.5)
nRBC: 5.8 % — ABNORMAL HIGH (ref 0.0–0.2)

## 2020-11-27 NOTE — Telephone Encounter (Signed)
XXXX 

## 2020-11-28 LAB — PROSTATE-SPECIFIC AG, SERUM (LABCORP): Prostate Specific Ag, Serum: 4277 ng/mL — ABNORMAL HIGH (ref 0.0–4.0)

## 2020-12-03 ENCOUNTER — Telehealth: Payer: Self-pay | Admitting: *Deleted

## 2020-12-03 ENCOUNTER — Other Ambulatory Visit: Payer: Self-pay | Admitting: Radiation Oncology

## 2020-12-03 ENCOUNTER — Telehealth: Payer: Self-pay | Admitting: Radiology

## 2020-12-03 MED ORDER — XTAMPZA ER 18 MG PO C12A: 18.0000 mg | EXTENDED_RELEASE_CAPSULE | Freq: Two times a day (BID) | ORAL | 0 refills | Status: DC

## 2020-12-03 NOTE — Telephone Encounter (Signed)
Called patient's sgo Jonathan Campbell) to remind of Xofigo Inj. for 12-04-20- arrival time- 11:45 am @ [facility], lvm for a return call

## 2020-12-03 NOTE — Telephone Encounter (Signed)
Patient would like a refill of XTAMPZA.

## 2020-12-04 ENCOUNTER — Telehealth: Payer: Self-pay | Admitting: *Deleted

## 2020-12-04 ENCOUNTER — Other Ambulatory Visit: Payer: Self-pay

## 2020-12-04 ENCOUNTER — Encounter: Payer: Self-pay | Admitting: Radiation Oncology

## 2020-12-04 ENCOUNTER — Ambulatory Visit (HOSPITAL_COMMUNITY)
Admission: RE | Admit: 2020-12-04 | Discharge: 2020-12-04 | Disposition: A | Discharge status: Home / Self Care | Payer: Medicare Other | Source: Ambulatory Visit | Attending: Radiation Oncology | Admitting: Radiation Oncology

## 2020-12-04 DIAGNOSIS — C7951 Secondary malignant neoplasm of bone: Secondary | ICD-10-CM | POA: Diagnosis present

## 2020-12-04 DIAGNOSIS — C61 Malignant neoplasm of prostate: Secondary | ICD-10-CM | POA: Diagnosis not present

## 2020-12-04 MED ORDER — RADIUM RA 223 DICHLORIDE 30 MCCI/ML IV SOLN
121.6200 | Freq: Once | INTRAVENOUS | Status: AC
  Administered 2020-12-04: 121.62 via INTRAVENOUS

## 2020-12-04 NOTE — Progress Notes (Signed)
  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  For details of his clinical history, please see the previously detailed consultation report that can be supplied with this letter.  I have recommended Radium 223 Dichloride treatment for monthly IV infusion over six months.  Based upon the randomized phase III Alsympca trial, we would expect an improvement in the patient's overall survival, improved pain control, decreased narcotic requirements, and decreased probability of skeletal related adverse events moving forward.  Given the data and this patient's situation, I feel that the delivery of Trudi Ida is medical necessity.  Please call if any further information is required to aid in this patient's treatment.    Sincerely yours,  Blair Promise, PhD, MD  -----------------------------------------------------------------------------------------------------------  Ref:  Zack Seal, Diannia Ruder, et al., Alpha emitter radium-223 and survival in metastatic prostate cancer. [facility] Med. 2013 Jul 18;369(3):213-23

## 2020-12-04 NOTE — Progress Notes (Signed)
  Radiation Oncology         [phone]) [phone] ________________________________  Name: Jonathan Campbell MRN: [id number]  Date: 12/04/2020  DOB: 1960-11-16  Xofigo Treatment Planning Note:  Diagnosis:  Castration resistant prostate cancer with painful bone involvement  Narrative: [name] is a patient who has been diagnosed with castration resistant prostate cancer with painful bone involvement.  His most recent blood counts show that he remains a good candidate to proceed with Ra-223.  The patient is going to receive Xofigo for his treatment.   Radiation Treatment Planning:  The prescribed radiation activity will be 50 kBq per kg per infusions. The plan is to offer a total of 6 IV administrations of this agent, assuming the blood counts are adequate prior to each administration, with each infusion done at 4 week intervals.  This will be done as an IV administration in the nuclear medicine department, with care to undertake all radiation protection precautions as recommended.    -----------------------------------  Blair Promise, PhD, MD

## 2020-12-04 NOTE — Progress Notes (Signed)
  Radiation Oncology         [phone] ________________________________  Name: Jonathan Campbell MRN: [id number]  Date: 12/04/2020  DOB: 09/08/1960  Radium-223 Infusion Note  Diagnosis:  Castration resistant prostate cancer with painful bone involvement  Current Infusion:    1  Planned Infusions:  6  Narrative: [name] presented to nuclear medicine for treatment. His most recent blood counts were reviewed.  He remains a good candidate to proceed with Ra-223.  The patient was situated in an infusion suite with a contact barrier placed under his arm. Intravenous access was established, using sterile technique, and a normal saline infusion from a syringe was started.  Micro-dosimetry:  The prescribed radiation activity was assayed and confirmed to be within specified tolerance.  Special Treatment Procedure - Infusion:  The nuclear medicine technologist and I personally verified the dose activity to be delivered as specified in the written directive, and verified the patient identification via 2 separate methods.  The syringe containing the dose was attached to a 3 way stopcock, and then the valve was opened to the patient, and the dose delivered over a minute. No complications were noted.  The total administered dose was 124.5 microcuries  A saline flush of the line and the syringe that contained the isotope was then performed.  The residual radioactivity in the syringe was 2.88 microcuries, so the actual infused isotope activity was 121.6 microcuries.   Pressure was applied to the venipuncture site, and a compression bandage placed.   Radiation Safety personnel were present to perform the discharge survey, as detailed on their documentation.   After a short period of observation, the patient had his IV removed.  Impression:  The patient tolerated his infusion relatively well.  Plan:  The patient will return in one month for ongoing care.    -----------------------------------  Blair Promise, PhD, MD

## 2020-12-04 NOTE — Telephone Encounter (Signed)
CoverMyMeds KEY: [id number], HWTUUE PA case: [id number] has been approved.  Effective 12/04/2020 through 06/27/2021.

## 2020-12-06 ENCOUNTER — Other Ambulatory Visit (HOSPITAL_COMMUNITY): Payer: Self-pay

## 2020-12-06 ENCOUNTER — Other Ambulatory Visit: Payer: Self-pay | Admitting: Oncology

## 2020-12-08 ENCOUNTER — Other Ambulatory Visit (HOSPITAL_COMMUNITY): Payer: Self-pay

## 2020-12-08 ENCOUNTER — Encounter: Payer: Self-pay | Admitting: Oncology

## 2020-12-08 MED ORDER — ENZALUTAMIDE 80 MG PO TABS
ORAL_TABLET | Freq: Every day | ORAL | 0 refills | Status: DC
  Filled 2020-12-08: qty 60, 30d supply, fill #0
  Filled 2020-12-08: qty 60, fill #0

## 2020-12-09 ENCOUNTER — Other Ambulatory Visit: Payer: Self-pay | Admitting: Radiation Oncology

## 2020-12-09 ENCOUNTER — Telehealth: Payer: Self-pay

## 2020-12-09 DIAGNOSIS — C7989 Secondary malignant neoplasm of other specified sites: Secondary | ICD-10-CM

## 2020-12-09 MED ORDER — OXYCODONE HCL 10 MG PO TABS: 20.0000 mg | ORAL_TABLET | Freq: Every day | ORAL | 0 refills | Status: DC

## 2020-12-09 NOTE — Telephone Encounter (Signed)
Patients wife called in to request refill on oxycodone 10 mg. Request prescription be sent to [facility].

## 2020-12-18 ENCOUNTER — Telehealth: Payer: Self-pay | Admitting: *Deleted

## 2020-12-18 ENCOUNTER — Telehealth: Payer: Self-pay | Admitting: Radiology

## 2020-12-18 ENCOUNTER — Other Ambulatory Visit (HOSPITAL_COMMUNITY): Payer: Self-pay | Admitting: Radiation Oncology

## 2020-12-18 DIAGNOSIS — C61 Malignant neoplasm of prostate: Secondary | ICD-10-CM

## 2020-12-18 DIAGNOSIS — C7951 Secondary malignant neoplasm of bone: Secondary | ICD-10-CM

## 2020-12-18 NOTE — Telephone Encounter (Signed)
Patient called to ask about plan for next injection. Please advise.

## 2020-12-18 NOTE — Telephone Encounter (Signed)
CALLED PATIENT TO  INFORM OF LAB AND WEIGHT APPT. FOR 01-01-21 2 12  PM @ [facility]. ON 01-08-21 - ARRIVAL TIME- 11:45 AM @ [facility], SPOKE WITH PATIENT AND HE IS AWARE OF THESE APPTS.

## 2020-12-22 ENCOUNTER — Encounter: Payer: Self-pay | Admitting: Oncology

## 2020-12-23 ENCOUNTER — Telehealth: Payer: Self-pay | Admitting: *Deleted

## 2020-12-23 NOTE — Telephone Encounter (Signed)
CALLED PATIENT'S SGO- Jonathan Campbell TO INFORM OF LAB AND WEIGHT FOR 01-01-21 @ 12 PM, AND HIS XOFIGO INJ. ON 01-08-21- ARRIVAL TIME- 11:45 AM @ [facility], SPOKE WITH PATIENT'S SGO- Jonathan Campbell AND SHE IS AWARE OF THESE APPTS.

## 2020-12-23 NOTE — Telephone Encounter (Signed)
CALLED PATIENT TO INFORM OF LAB AND WEIGHT APPT. FOR 01-01-21 AND HIS XOFIGO INJ. FOR 01-08-21, LVM FOR A RETURN CALL

## 2020-12-30 ENCOUNTER — Telehealth: Payer: Self-pay | Admitting: Radiology

## 2020-12-30 ENCOUNTER — Other Ambulatory Visit: Payer: Self-pay | Admitting: Radiation Oncology

## 2020-12-30 MED ORDER — XTAMPZA ER 18 MG PO C12A: 18.0000 mg | EXTENDED_RELEASE_CAPSULE | Freq: Two times a day (BID) | ORAL | 0 refills | Status: DC

## 2020-12-30 NOTE — Telephone Encounter (Signed)
Patient requests refill of Xtampza 18mg  to be sent to [facility].

## 2020-12-31 ENCOUNTER — Telehealth: Payer: Self-pay | Admitting: *Deleted

## 2020-12-31 NOTE — Telephone Encounter (Signed)
Called patient to remind of lab and weight for 01-01-21 @ 12 pm @ [facility], spoke with patient and he is aware of this appt.

## 2021-01-01 ENCOUNTER — Other Ambulatory Visit (HOSPITAL_COMMUNITY): Payer: Self-pay

## 2021-01-01 ENCOUNTER — Other Ambulatory Visit: Payer: Self-pay | Admitting: Oncology

## 2021-01-01 ENCOUNTER — Other Ambulatory Visit: Payer: Self-pay

## 2021-01-01 ENCOUNTER — Ambulatory Visit
Admission: RE | Admit: 2021-01-01 | Discharge: 2021-01-01 | Disposition: A | Discharge status: Home / Self Care | Payer: Medicare Other | Source: Ambulatory Visit | Attending: Radiation Oncology | Admitting: Radiation Oncology

## 2021-01-01 DIAGNOSIS — C61 Malignant neoplasm of prostate: Secondary | ICD-10-CM | POA: Diagnosis not present

## 2021-01-01 DIAGNOSIS — C7951 Secondary malignant neoplasm of bone: Secondary | ICD-10-CM | POA: Insufficient documentation

## 2021-01-01 LAB — CBC WITH DIFFERENTIAL (CANCER CENTER ONLY)
Abs Immature Granulocytes: 0.74 10*3/uL — ABNORMAL HIGH (ref 0.00–0.07)
Basophils Absolute: 0.1 10*3/uL (ref 0.0–0.1)
Basophils Relative: 1 %
Eosinophils Absolute: 0.1 10*3/uL (ref 0.0–0.5)
Eosinophils Relative: 1 %
HCT: 26.8 % — ABNORMAL LOW (ref 39.0–52.0)
Hemoglobin: 9 g/dL — ABNORMAL LOW (ref 13.0–17.0)
Immature Granulocytes: 12 %
Lymphocytes Relative: 30 %
Lymphs Abs: 1.9 10*3/uL (ref 0.7–4.0)
MCH: 28.2 pg (ref 26.0–34.0)
MCHC: 33.6 g/dL (ref 30.0–36.0)
MCV: 84 fL (ref 80.0–100.0)
Monocytes Absolute: 0.6 10*3/uL (ref 0.1–1.0)
Monocytes Relative: 9 %
Neutro Abs: 3.1 10*3/uL (ref 1.7–7.7)
Neutrophils Relative %: 47 %
Platelet Count: 85 10*3/uL — ABNORMAL LOW (ref 150–400)
RBC: 3.19 MIL/uL — ABNORMAL LOW (ref 4.22–5.81)
RDW: 20.7 % — ABNORMAL HIGH (ref 11.5–15.5)
WBC Count: 6.4 10*3/uL (ref 4.0–10.5)
nRBC: 20.2 % — ABNORMAL HIGH (ref 0.0–0.2)

## 2021-01-01 LAB — CMP (CANCER CENTER ONLY)
ALT: 11 U/L (ref 0–44)
AST: 24 U/L (ref 15–41)
Albumin: 3.3 g/dL — ABNORMAL LOW (ref 3.5–5.0)
Alkaline Phosphatase: 217 U/L — ABNORMAL HIGH (ref 38–126)
Anion gap: 12 (ref 5–15)
BUN: 18 mg/dL (ref 6–20)
CO2: 22 mmol/L (ref 22–32)
Calcium: 9.3 mg/dL (ref 8.9–10.3)
Chloride: 108 mmol/L (ref 98–111)
Creatinine: 1.3 mg/dL — ABNORMAL HIGH (ref 0.61–1.24)
GFR, Estimated: >60 mL/min (ref >60)
Glucose, Bld: 138 mg/dL — ABNORMAL HIGH (ref 70–99)
Potassium: 4 mmol/L (ref 3.5–5.1)
Sodium: 142 mmol/L (ref 135–145)
Total Bilirubin: 0.9 mg/dL (ref 0.3–1.2)
Total Protein: 8.4 g/dL — ABNORMAL HIGH (ref 6.5–8.1)

## 2021-01-01 MED ORDER — ENZALUTAMIDE 80 MG PO TABS
ORAL_TABLET | Freq: Every day | ORAL | 0 refills | Status: DC
  Filled 2021-01-01: qty 60, 30d supply, fill #0

## 2021-01-02 ENCOUNTER — Telehealth: Payer: Self-pay | Admitting: Radiology

## 2021-01-02 ENCOUNTER — Other Ambulatory Visit (HOSPITAL_COMMUNITY): Payer: Self-pay

## 2021-01-02 LAB — PROSTATE-SPECIFIC AG, SERUM (LABCORP): Prostate Specific Ag, Serum: 4633 ng/mL — ABNORMAL HIGH (ref 0.0–4.0)

## 2021-01-02 NOTE — Telephone Encounter (Signed)
Patient requests refill of oxycodone 10mg .

## 2021-01-05 ENCOUNTER — Other Ambulatory Visit: Payer: Self-pay | Admitting: Radiation Oncology

## 2021-01-05 ENCOUNTER — Other Ambulatory Visit (HOSPITAL_COMMUNITY): Payer: Self-pay

## 2021-01-05 DIAGNOSIS — C7989 Secondary malignant neoplasm of other specified sites: Secondary | ICD-10-CM

## 2021-01-05 MED ORDER — OXYCODONE HCL 10 MG PO TABS: 20.0000 mg | ORAL_TABLET | Freq: Every day | ORAL | 0 refills | Status: DC

## 2021-01-07 ENCOUNTER — Telehealth: Payer: Self-pay | Admitting: *Deleted

## 2021-01-07 NOTE — Telephone Encounter (Signed)
Called patient to remind of Xofigo Inj. for 01-08-21, arrival time- 11:45 am @ [facility], lvm for a return call

## 2021-01-08 ENCOUNTER — Encounter: Payer: Self-pay | Admitting: Oncology

## 2021-01-08 ENCOUNTER — Other Ambulatory Visit: Payer: Self-pay

## 2021-01-08 ENCOUNTER — Ambulatory Visit (HOSPITAL_COMMUNITY)
Admission: RE | Admit: 2021-01-08 | Discharge: 2021-01-08 | Disposition: A | Discharge status: Home / Self Care | Payer: Medicare Other | Source: Ambulatory Visit | Attending: Radiation Oncology | Admitting: Radiation Oncology

## 2021-01-08 DIAGNOSIS — C7951 Secondary malignant neoplasm of bone: Secondary | ICD-10-CM

## 2021-01-08 DIAGNOSIS — C61 Malignant neoplasm of prostate: Secondary | ICD-10-CM | POA: Insufficient documentation

## 2021-01-09 ENCOUNTER — Ambulatory Visit (HOSPITAL_COMMUNITY)
Admission: RE | Admit: 2021-01-09 | Discharge: 2021-01-09 | Disposition: A | Discharge status: Home / Self Care | Payer: Medicare Other | Source: Ambulatory Visit | Attending: Radiation Oncology | Admitting: Radiation Oncology

## 2021-01-09 DIAGNOSIS — C7951 Secondary malignant neoplasm of bone: Secondary | ICD-10-CM | POA: Insufficient documentation

## 2021-01-09 DIAGNOSIS — C61 Malignant neoplasm of prostate: Secondary | ICD-10-CM | POA: Diagnosis not present

## 2021-01-09 MED ORDER — RADIUM RA 223 DICHLORIDE 30 MCCI/ML IV SOLN
113.2300 | Freq: Once | INTRAVENOUS | Status: AC | PRN
  Administered 2021-01-09: 113.23 via INTRAVENOUS

## 2021-01-09 NOTE — Progress Notes (Signed)
  Radiation Oncology         [phone] ________________________________  Name: Jonathan Campbell MRN: [id number]  Date: 01/09/2021  DOB: Jan 22, 1961  Radium-223 Infusion Note  Diagnosis:  Castration resistant prostate cancer with painful bone involvement  Current Infusion:    2  Planned Infusions:  6  Narrative: Mr. Dreon Pineda presented to nuclear medicine for treatment. His most recent blood counts were reviewed.  He remains a good candidate to proceed with Ra-223.  The patient was situated in an infusion suite with a contact barrier placed under his arm. Intravenous access was established, using sterile technique, and a normal saline infusion from a syringe was started.  Micro-dosimetry:  The prescribed radiation activity was assayed and confirmed to be within specified tolerance.  Special Treatment Procedure - Infusion:  The nuclear medicine technologist and I personally verified the dose activity to be delivered as specified in the written directive, and verified the patient identification via 2 separate methods.  The syringe containing the dose was attached to an intravenous access and the dose delivered over a minute. No complications were noted.  The total administered dose was 115.8 microcuries.   A saline flush of the line and the syringe that contained the isotope was then performed.  The residual radioactivity in the syringe was 2.57 microcuries, so the actual infused isotope activity was 113.23 microcuries.   Pressure was applied to the venipuncture site, and a compression bandage placed.   Radiation Safety personnel were present to perform the discharge survey, as detailed on their documentation.   After a short period of observation, the patient had his IV removed.  Impression:  The patient tolerated his infusion relatively well.  Plan:  The patient will return in one month for ongoing care.    ________________________________  Sheral Apley. Tammi Klippel, M.D.

## 2021-01-14 ENCOUNTER — Telehealth: Payer: Self-pay | Admitting: *Deleted

## 2021-01-14 ENCOUNTER — Other Ambulatory Visit (HOSPITAL_COMMUNITY): Payer: Self-pay | Admitting: Radiation Oncology

## 2021-01-14 DIAGNOSIS — C61 Malignant neoplasm of prostate: Secondary | ICD-10-CM

## 2021-01-14 NOTE — Telephone Encounter (Signed)
CALLED PATIENT TO INFORM OF LAB AND WEIGHT FOR 02-05-21 @ 12 PM @ [facility] ON 02-12-21- ARRIVAL TIME- 11:45 AM @ [facility], SPOKE WITH PATIENT AND HE IS AWARE OF THESE  APPTS.

## 2021-01-26 ENCOUNTER — Other Ambulatory Visit (HOSPITAL_COMMUNITY): Payer: Self-pay

## 2021-01-26 ENCOUNTER — Other Ambulatory Visit: Payer: Self-pay | Admitting: Radiation Oncology

## 2021-01-26 ENCOUNTER — Telehealth: Payer: Self-pay | Admitting: Radiology

## 2021-01-26 MED ORDER — XTAMPZA ER 18 MG PO C12A: 18.0000 mg | EXTENDED_RELEASE_CAPSULE | Freq: Two times a day (BID) | ORAL | 0 refills | Status: DC

## 2021-01-26 NOTE — Telephone Encounter (Signed)
Patient requests refill of Xtampza ER '18mg'$ .

## 2021-01-27 ENCOUNTER — Other Ambulatory Visit: Payer: Self-pay | Admitting: Oncology

## 2021-01-27 ENCOUNTER — Other Ambulatory Visit (HOSPITAL_COMMUNITY): Payer: Self-pay

## 2021-01-27 DIAGNOSIS — C61 Malignant neoplasm of prostate: Secondary | ICD-10-CM

## 2021-01-27 MED ORDER — ENZALUTAMIDE 80 MG PO TABS
ORAL_TABLET | Freq: Every day | ORAL | 0 refills | Status: DC
  Filled 2021-01-27 – 2021-03-17 (×3): qty 60, 30d supply, fill #0

## 2021-01-29 ENCOUNTER — Other Ambulatory Visit (HOSPITAL_COMMUNITY): Payer: Self-pay

## 2021-01-30 ENCOUNTER — Other Ambulatory Visit (HOSPITAL_COMMUNITY): Payer: Self-pay

## 2021-02-02 ENCOUNTER — Other Ambulatory Visit: Payer: Self-pay | Admitting: Radiation Oncology

## 2021-02-02 ENCOUNTER — Telehealth: Payer: Self-pay | Admitting: Radiology

## 2021-02-02 DIAGNOSIS — C7989 Secondary malignant neoplasm of other specified sites: Secondary | ICD-10-CM

## 2021-02-02 DIAGNOSIS — C801 Malignant (primary) neoplasm, unspecified: Secondary | ICD-10-CM

## 2021-02-02 MED ORDER — OXYCODONE HCL 10 MG PO TABS: 20.0000 mg | ORAL_TABLET | Freq: Every day | ORAL | 0 refills | Status: DC

## 2021-02-02 NOTE — Telephone Encounter (Signed)
Notified patient of script sent to pharmacy. 

## 2021-02-02 NOTE — Telephone Encounter (Signed)
Patient requests refill of oxycodone '10mg'$  po to be sent to [facility].

## 2021-02-04 ENCOUNTER — Other Ambulatory Visit: Payer: Self-pay | Admitting: Radiology

## 2021-02-04 ENCOUNTER — Telehealth: Payer: Self-pay | Admitting: *Deleted

## 2021-02-04 DIAGNOSIS — C7951 Secondary malignant neoplasm of bone: Secondary | ICD-10-CM

## 2021-02-04 DIAGNOSIS — C61 Malignant neoplasm of prostate: Secondary | ICD-10-CM

## 2021-02-04 NOTE — Telephone Encounter (Signed)
Called patient to remind of lab and weight on 02-05-21 @ 12 pm @ [facility], lvm for a return call

## 2021-02-05 ENCOUNTER — Other Ambulatory Visit: Payer: Self-pay

## 2021-02-05 ENCOUNTER — Ambulatory Visit
Admission: RE | Admit: 2021-02-05 | Discharge: 2021-02-05 | Disposition: A | Discharge status: Home / Self Care | Payer: Medicare Other | Source: Ambulatory Visit | Attending: Radiation Oncology | Admitting: Radiation Oncology

## 2021-02-05 DIAGNOSIS — C7951 Secondary malignant neoplasm of bone: Secondary | ICD-10-CM | POA: Diagnosis present

## 2021-02-05 DIAGNOSIS — C61 Malignant neoplasm of prostate: Secondary | ICD-10-CM | POA: Diagnosis present

## 2021-02-05 LAB — CBC WITH DIFFERENTIAL (CANCER CENTER ONLY)
Abs Immature Granulocytes: 0.69 10*3/uL — ABNORMAL HIGH (ref 0.00–0.07)
Basophils Absolute: 0 10*3/uL (ref 0.0–0.1)
Basophils Relative: 1 %
Eosinophils Absolute: 0.1 10*3/uL (ref 0.0–0.5)
Eosinophils Relative: 1 %
HCT: 21.7 % — ABNORMAL LOW (ref 39.0–52.0)
Hemoglobin: 7.2 g/dL — ABNORMAL LOW (ref 13.0–17.0)
Immature Granulocytes: 14 %
Lymphocytes Relative: 19 %
Lymphs Abs: 0.9 10*3/uL (ref 0.7–4.0)
MCH: 28.9 pg (ref 26.0–34.0)
MCHC: 33.2 g/dL (ref 30.0–36.0)
MCV: 87.1 fL (ref 80.0–100.0)
Monocytes Absolute: 0.6 10*3/uL (ref 0.1–1.0)
Monocytes Relative: 12 %
Neutro Abs: 2.6 10*3/uL (ref 1.7–7.7)
Neutrophils Relative %: 53 %
Platelet Count: 61 10*3/uL — ABNORMAL LOW (ref 150–400)
RBC: 2.49 MIL/uL — ABNORMAL LOW (ref 4.22–5.81)
RDW: 22 % — ABNORMAL HIGH (ref 11.5–15.5)
WBC Count: 4.9 10*3/uL (ref 4.0–10.5)
nRBC: 22.6 % — ABNORMAL HIGH (ref 0.0–0.2)

## 2021-02-05 LAB — CMP (CANCER CENTER ONLY)
ALT: 12 U/L (ref 0–44)
AST: 23 U/L (ref 15–41)
Albumin: 3.2 g/dL — ABNORMAL LOW (ref 3.5–5.0)
Alkaline Phosphatase: 165 U/L — ABNORMAL HIGH (ref 38–126)
Anion gap: 10 (ref 5–15)
BUN: 10 mg/dL (ref 6–20)
CO2: 21 mmol/L — ABNORMAL LOW (ref 22–32)
Calcium: 9.2 mg/dL (ref 8.9–10.3)
Chloride: 112 mmol/L — ABNORMAL HIGH (ref 98–111)
Creatinine: 1.05 mg/dL (ref 0.61–1.24)
GFR, Estimated: >60 mL/min (ref >60)
Glucose, Bld: 130 mg/dL — ABNORMAL HIGH (ref 70–99)
Potassium: 3.4 mmol/L — ABNORMAL LOW (ref 3.5–5.1)
Sodium: 143 mmol/L (ref 135–145)
Total Bilirubin: 0.7 mg/dL (ref 0.3–1.2)
Total Protein: 7.8 g/dL (ref 6.5–8.1)

## 2021-02-06 ENCOUNTER — Other Ambulatory Visit (HOSPITAL_COMMUNITY): Payer: Self-pay

## 2021-02-06 LAB — PROSTATE-SPECIFIC AG, SERUM (LABCORP): Prostate Specific Ag, Serum: 4171 ng/mL — ABNORMAL HIGH (ref 0.0–4.0)

## 2021-02-09 ENCOUNTER — Other Ambulatory Visit: Payer: Self-pay | Admitting: Radiation Oncology

## 2021-02-09 DIAGNOSIS — C7989 Secondary malignant neoplasm of other specified sites: Secondary | ICD-10-CM

## 2021-02-09 DIAGNOSIS — C801 Malignant (primary) neoplasm, unspecified: Secondary | ICD-10-CM

## 2021-02-09 MED ORDER — OXYCODONE HCL 10 MG PO TABS: 20.0000 mg | ORAL_TABLET | Freq: Every day | ORAL | 0 refills | Status: DC

## 2021-02-11 ENCOUNTER — Telehealth: Payer: Self-pay | Admitting: *Deleted

## 2021-02-11 NOTE — Telephone Encounter (Signed)
CALLED PATIENT TO REMIND OF XOFIGO INJ. FOR 02-12-21- ARRIVAL TIME- 11:45 AM @ [facility], LVM FOR A RETURN CALL

## 2021-02-12 ENCOUNTER — Encounter: Payer: Self-pay | Admitting: Radiation Oncology

## 2021-02-12 ENCOUNTER — Ambulatory Visit (HOSPITAL_COMMUNITY)
Admission: RE | Admit: 2021-02-12 | Discharge: 2021-02-12 | Disposition: A | Discharge status: Home / Self Care | Payer: Medicare Other | Source: Ambulatory Visit | Attending: Radiation Oncology | Admitting: Radiation Oncology

## 2021-02-12 ENCOUNTER — Other Ambulatory Visit: Payer: Self-pay

## 2021-02-12 DIAGNOSIS — C61 Malignant neoplasm of prostate: Secondary | ICD-10-CM | POA: Diagnosis not present

## 2021-02-12 MED ORDER — RADIUM RA 223 DICHLORIDE 30 MCCI/ML IV SOLN
120.8000 | Freq: Once | INTRAVENOUS | Status: AC | PRN
  Administered 2021-02-12: 120.8 via INTRAVENOUS

## 2021-02-12 NOTE — Progress Notes (Signed)
  Radiation Oncology         [phone] ________________________________  Name: Jonathan Campbell MRN: [id number]  Date: 02/12/2021  DOB: 1960/10/28  Radium-223 Infusion Note  Diagnosis:  Castration resistant prostate cancer with painful bone involvement  Current Infusion:    3  Planned Infusions:  6  Narrative: Mr. Jonathan Campbell presented to nuclear medicine for treatment. His most recent blood counts were reviewed.  He remains a good candidate to proceed with Ra-223.  The patient was situated in an infusion suite with a contact barrier placed under his arm. Intravenous access was established, using sterile technique, and a normal saline infusion from a syringe was started.  Micro-dosimetry:  The prescribed radiation activity was assayed and confirmed to be within specified tolerance.  Special Treatment Procedure - Infusion:  The nuclear medicine technologist and I personally verified the dose activity to be delivered as specified in the written directive, and verified the patient identification via 2 separate methods.  The syringe containing the dose was attached to a 3 way stopcock, and then the valve was opened to the patient, and the dose delivered over a minute. No complications were noted.  The total administered dose was 123.0 microcuries  A saline flush of the line and the syringe that contained the isotope was then performed.  The residual radioactivity in the syringe was 2.20 microcuries, so the actual infused isotope activity was 120.8 microcuries.   Pressure was applied to the venipuncture site, and a compression bandage placed.   Radiation Safety personnel were present to perform the discharge survey, as detailed on their documentation.   After a short period of observation, the patient had his IV removed.  Impression:  The patient tolerated his infusion relatively well.  Plan:  The patient will return in one month for ongoing care.    -----------------------------------  Blair Promise, PhD, MD

## 2021-02-17 ENCOUNTER — Other Ambulatory Visit (HOSPITAL_COMMUNITY): Payer: Self-pay | Admitting: Radiation Oncology

## 2021-02-17 DIAGNOSIS — C61 Malignant neoplasm of prostate: Secondary | ICD-10-CM

## 2021-02-18 ENCOUNTER — Inpatient Hospital Stay: Payer: Medicare Other | Admitting: Oncology

## 2021-02-18 ENCOUNTER — Telehealth: Payer: Self-pay | Admitting: *Deleted

## 2021-02-18 ENCOUNTER — Inpatient Hospital Stay: Payer: Medicare Other

## 2021-02-18 NOTE — Telephone Encounter (Signed)
Patient and Ms. Davis (fiance) called. Patient's best friend died and funeral is today. Patient wants to cancel his apptsfor today and r/s for later this week. They have spoken with [facility] scheduling and are expecting a call back today.  Per scheduling - patient's appts r/s for 8/26.

## 2021-02-20 ENCOUNTER — Inpatient Hospital Stay: Payer: Medicare Other

## 2021-02-20 ENCOUNTER — Inpatient Hospital Stay: Payer: Medicare Other | Attending: Oncology | Admitting: Oncology

## 2021-02-20 ENCOUNTER — Telehealth: Payer: Self-pay

## 2021-02-20 ENCOUNTER — Other Ambulatory Visit: Payer: Self-pay

## 2021-02-20 VITALS — BP 118/62 | HR 99 | Temp 98.0°F | Resp 18 | Ht 69.0 in | Wt 174.6 lb

## 2021-02-20 DIAGNOSIS — Z79899 Other long term (current) drug therapy: Secondary | ICD-10-CM | POA: Insufficient documentation

## 2021-02-20 DIAGNOSIS — D6959 Other secondary thrombocytopenia: Secondary | ICD-10-CM | POA: Insufficient documentation

## 2021-02-20 DIAGNOSIS — C61 Malignant neoplasm of prostate: Secondary | ICD-10-CM

## 2021-02-20 DIAGNOSIS — R5383 Other fatigue: Secondary | ICD-10-CM | POA: Insufficient documentation

## 2021-02-20 DIAGNOSIS — D630 Anemia in neoplastic disease: Secondary | ICD-10-CM | POA: Diagnosis not present

## 2021-02-20 DIAGNOSIS — D649 Anemia, unspecified: Secondary | ICD-10-CM | POA: Diagnosis not present

## 2021-02-20 DIAGNOSIS — C7951 Secondary malignant neoplasm of bone: Secondary | ICD-10-CM | POA: Insufficient documentation

## 2021-02-20 LAB — CBC WITH DIFFERENTIAL (CANCER CENTER ONLY)
Abs Immature Granulocytes: 0.57 10*3/uL — ABNORMAL HIGH (ref 0.00–0.07)
Basophils Absolute: 0 10*3/uL (ref 0.0–0.1)
Basophils Relative: 1 %
Eosinophils Absolute: 0.1 10*3/uL (ref 0.0–0.5)
Eosinophils Relative: 1 %
HCT: 20.7 % — ABNORMAL LOW (ref 39.0–52.0)
Hemoglobin: 6.7 g/dL — CL (ref 13.0–17.0)
Immature Granulocytes: 11 %
Lymphocytes Relative: 21 %
Lymphs Abs: 1.1 10*3/uL (ref 0.7–4.0)
MCH: 28.9 pg (ref 26.0–34.0)
MCHC: 32.4 g/dL (ref 30.0–36.0)
MCV: 89.2 fL (ref 80.0–100.0)
Monocytes Absolute: 0.7 10*3/uL (ref 0.1–1.0)
Monocytes Relative: 13 %
Neutro Abs: 2.6 10*3/uL (ref 1.7–7.7)
Neutrophils Relative %: 53 %
Platelet Count: 63 10*3/uL — ABNORMAL LOW (ref 150–400)
RBC: 2.32 MIL/uL — ABNORMAL LOW (ref 4.22–5.81)
RDW: 21.2 % — ABNORMAL HIGH (ref 11.5–15.5)
WBC Count: 5 10*3/uL (ref 4.0–10.5)
nRBC: 15.7 % — ABNORMAL HIGH (ref 0.0–0.2)

## 2021-02-20 NOTE — Progress Notes (Signed)
Hematology and Oncology Follow Up Visit  Jonathan Campbell [id number] 1961/02/25 60 y.o. 02/20/2021 9:14 AM Jonathan Side., FNPSmith, Jonathan Limes., FNP   Principle Diagnosis: 60 year old man with castration-resistant advanced prostate cancer with disease to the bone diagnosed in 2019.    Prior Therapy:  Radiation therapy as follows:  07/26/2018 - 08/08/2018: Right superior glenoid of right shoulder / 30 Gy in 10 fractions of 3 Gy   12/01/2017 - 12/14/2017: 1. Left Scapula / 30 Gy in 10 fractions 2. Lumbar Spine (L4 region) / 30 Gy in 10 fractions   09/16/2017 - 09/29/2017: T Spine / 30 Gy in 10 fractions  He completed 4 weeks of Casodex between April and May 2019.  Zytiga 1000 mg daily started in May 2019. Therapy discontinued in June 2021 because of progression of disease.  Current therapy:  Lupron 30 mg given starting on 10/05/2017.  Will receive Eligard soon and repeat in 4 months.  Xtandi 80 mg daily started in June 2021.  Trudi Ida started in June 2022.  He completed 3 months of therapy.  Interim History: [name] presents today for a follow-up visit.  Since the last visit, he reports no major changes in his health.  He has tolerated Xofigo without any major complications and feels some improvement in his overall pain.  He is ambulating better and overall functioning at a higher level than previously.  He does report some mild fatigue and tiredness but no chest pain or shortness of breath.  He denies any bleeding complications.      Medications: Updated on review. Current Outpatient Medications  Medication Sig Dispense Refill   acetaminophen (TYLENOL) 500 MG tablet Take 500 mg by mouth daily as needed.     enzalutamide (XTANDI) 80 MG tablet TAKE 2 TABLETS BY MOUTH DAILY. 60 tablet 0   oxyCODONE ER (XTAMPZA ER) 18 MG C12A Take 18 mg by mouth in the morning and at bedtime. 12 hours apart 60 capsule 0   Oxycodone HCl 10 MG TABS Take 2 tablets (20 mg total) by mouth 6 (six)  times daily. PRN Pain. 180 tablet 0   polyethylene glycol (MIRALAX / GLYCOLAX) packet Take 17 g by mouth daily. Use OTC Clearax 17 g po daily (Patient not taking: Reported on 10/22/2020) 30 each 2   No current facility-administered medications for this visit.     Allergies: No Known Allergies     Physical Exam:      Blood pressure 118/62, pulse 99, temperature 98 F (36.7 C), temperature source Oral, resp. rate 18, height '5\' 9"'$  (1.753 m), weight 174 lb 9.6 oz (79.2 kg), SpO2 100 %.      ECOG: 1   General appearance: Alert, awake without any distress. Head: Atraumatic without abnormalities Oropharynx: Without any thrush or ulcers. Eyes: No scleral icterus. Lymph nodes: No lymphadenopathy noted in the cervical, supraclavicular, or axillary nodes Heart:regular rate and rhythm, without any murmurs or gallops.   Lung: Clear to auscultation without any rhonchi, wheezes or dullness to percussion. Abdomin: Soft, nontender without any shifting dullness or ascites. Musculoskeletal: No clubbing or cyanosis. Neurological: No motor or sensory deficits. Dermatological: No skin rashes or lesions.            Lab Results: Lab Results  Component Value Date   WBC 4.9 02/05/2021   HGB 7.2 (L) 02/05/2021   HCT 21.7 (L) 02/05/2021   MCV 87.1 02/05/2021   PLT 61 (L) 02/05/2021     Chemistry  Component Value Date/Time   NA 143 02/05/2021 1205   K 3.4 (L) 02/05/2021 1205   CL 112 (H) 02/05/2021 1205   CO2 21 (L) 02/05/2021 1205   BUN 10 02/05/2021 1205   CREATININE 1.05 02/05/2021 1205      Component Value Date/Time   CALCIUM 9.2 02/05/2021 1205   ALKPHOS 165 (H) 02/05/2021 1205   AST 23 02/05/2021 1205   ALT 12 02/05/2021 1205   BILITOT 0.7 02/05/2021 1205      Results for Jonathan Campbell, Jonathan Campbell (MRN [id number]) as of 02/20/2021 09:15  Ref. Range 01/01/2021 11:16 02/05/2021 12:05  Prostate Specific Ag, Serum Latest Ref Range: 0.0 - 4.0 ng/mL 4,633.0 (H) 4,171.0 (H)       Impression and Plan:  60 year old man with   1.  Castration hyper resistant advanced prostate cancer with disease to the bone and lymphadenopathy diagnosed in March 2019.     He is currently on Xofigo given his predominantly bone disease and increased pain.  He has completed 3 months of therapy.  Alternative treatment choices including systemic chemotherapy utilizing Taxotere were reviewed.  He has developed worsening anemia and thrombocytopenia which could be related to malignancy and infiltrating disease in the bone marrow versus Xofigo effect.  We will obtain guardant 360 analysis today to evaluate his candidacy for possible PARP inhibitor as a alternative therapeutic option.   2.  Pain: manageable at this time.  He is on appropriate pain medication.    3.  Bone directed therapy: I recommended calcium and vitamin D supplements.  Delton See option has been deferred.  4.  Androgen deprivation: He will receive Eligard soon and repeated in 4 months.  Complication including weight gain, hot flashes were discussed.  He was scheduled to receive it today but preferred to defer that till next week and will not wait for his injection today.   5.  Prognosis and goals of care: Overall guarded given that his rapid disease progression and his disease is incurable.  Other options remain reasonable given his reasonable performance status.  6. thrombocytopenia: Related to his malignancy predominantly infiltrative prostate cancer into the bone marrow.  Trudi Ida could also be contributing given the high-volume bone disease he has.  No need for transfusion at this time.  Continue to monitor for bleeding.  7.  Anemia: Related to malignancy and chemotherapy.  He is mildly symptomatic and I offered him packed red cell transfusion.  He is open to the idea and would like to arrange for next week.   8.  Follow-up: We will arrange for echo Red cell transfusion and Eligard injection next week.  He will return in  4 months for repeat evaluation and Eligard injection.    30 minutes were dedicated to this encounter.  Time was spent on reviewing his disease status, treatment choices and addressing complications related to his cancer and cancer therapy.  Zola Button, MD 8/26/20229:14 AM

## 2021-02-20 NOTE — Telephone Encounter (Signed)
CRITICAL VALUE STICKER  CRITICAL VALUE: Hgb = 6.7  RECEIVER (on-site recipient of call): Yetta Glassman, CMA  DATE & TIME NOTIFIED: 02/20/21 at 9:23am  MESSENGER (representative from lab): Suanne Marker  MD NOTIFIED: Dr. Sondra Come  TIME OF NOTIFICATION: 02/20/21 at 9:35am  RESPONSE: Notification sent to Dr. Sondra Come and Amy, RN for follow-up with pt.

## 2021-02-23 ENCOUNTER — Telehealth: Payer: Self-pay | Admitting: *Deleted

## 2021-02-23 ENCOUNTER — Telehealth: Payer: Self-pay | Admitting: Oncology

## 2021-02-23 NOTE — Telephone Encounter (Signed)
Returned PC to patient, he left VM saying that he was concerned that he does not have another appointment until December.  Informed patient he has appointments for blood transfusion & injection on 02/27/21 starting at 9:30.

## 2021-02-23 NOTE — Telephone Encounter (Signed)
Scheduled per 08/26 los, spoke with patient's significant other. Patient will be notified of upcoming 09/02 and December appointments.

## 2021-02-24 ENCOUNTER — Other Ambulatory Visit: Payer: Self-pay | Admitting: Radiation Oncology

## 2021-02-24 ENCOUNTER — Telehealth: Payer: Self-pay | Admitting: Radiology

## 2021-02-24 ENCOUNTER — Other Ambulatory Visit (HOSPITAL_COMMUNITY): Payer: Self-pay

## 2021-02-24 DIAGNOSIS — C7989 Secondary malignant neoplasm of other specified sites: Secondary | ICD-10-CM

## 2021-02-24 MED ORDER — OXYCODONE HCL 10 MG PO TABS
20.0000 mg | ORAL_TABLET | Freq: Every day | ORAL | 0 refills | Status: DC
Start: 2021-02-24 — End: 2021-03-06

## 2021-02-24 NOTE — Telephone Encounter (Signed)
Patient reports new pain in right shoulder and would like to see Dr Sondra Come to discuss this.

## 2021-02-24 NOTE — Telephone Encounter (Signed)
Notified patient of script sent to pharmacy. 

## 2021-02-24 NOTE — Telephone Encounter (Signed)
Patient requests refill of Oxycodone '10mg'$ .

## 2021-02-25 ENCOUNTER — Telehealth: Payer: Self-pay | Admitting: Family

## 2021-02-25 NOTE — Telephone Encounter (Signed)
   Jonathan Campbell DOB: 1960/11/07 MRN: [id number]   RIDER WAIVER AND RELEASE OF LIABILITY  For purposes of improving physical access to our facilities, [facility] is pleased to partner with third parties to provide [place] patients or other authorized individuals the option of convenient, on-demand ground transportation services (the [place]") through use of the technology service that enables users to request on-demand ground transportation from independent third-party providers.  By opting to use and accept these [employer], I, the undersigned, hereby agree on behalf of myself, and on behalf of any minor child using the Government social research officer for whom I am the parent or legal guardian, as follows:  Government social research officer provided to me are provided by independent third-party transportation providers who are not [employer] or employees and who are unaffiliated with [employer]. [facility] is neither a transportation carrier nor a common or public carrier. [facility] has no control over the quality or safety of the transportation that occurs as a result of the [employer]. [facility] cannot guarantee that any third-party transportation provider will complete any arranged transportation service. [facility] makes no representation, warranty, or guarantee regarding the reliability, timeliness, quality, safety, suitability, or availability of any of the Transport Services or that they will be error free. I fully understand that traveling by vehicle involves risks and dangers of serious bodily injury, including permanent disability, paralysis, and death. I agree, on behalf of myself and on behalf of any minor child using the Transport Services for whom I am the parent or legal guardian, that the entire risk arising out of my use of the [employer] remains solely with me, to the maximum extent permitted under applicable law. The [employer] are provided "as is"  and "as available." [facility] disclaims all representations and warranties, express, implied or statutory, not expressly set out in these terms, including the implied warranties of merchantability and fitness for a particular purpose. I hereby waive and release [facility], its agents, employees, officers, directors, representatives, insurers, attorneys, assigns, successors, subsidiaries, and affiliates from any and all past, present, or future claims, demands, liabilities, actions, causes of action, or suits of any kind directly or indirectly arising from acceptance and use of the [employer]. I further waive and release [facility] and its affiliates from all present and future liability and responsibility for any injury or death to persons or damages to property caused by or related to the use of the [employer]. I have read this Waiver and Release of Liability, and I understand the terms used in it and their legal significance. This Waiver is freely and voluntarily given with the understanding that my right (as well as the right of any minor child for whom I am the parent or legal guardian using the [employer]) to legal recourse against [facility] in connection with the [employer] is knowingly surrendered in return for use of these services.   I attest that I read the consent document to Jonathan Campbell, gave Mr. Rogalski the opportunity to ask questions and answered the questions asked (if any). I affirm that Jonathan Campbell then provided consent for he's participation in this program.     Legrand Pitts

## 2021-02-26 ENCOUNTER — Telehealth: Payer: Self-pay | Admitting: *Deleted

## 2021-02-26 ENCOUNTER — Other Ambulatory Visit (HOSPITAL_COMMUNITY): Payer: Self-pay

## 2021-02-26 ENCOUNTER — Other Ambulatory Visit: Payer: Self-pay | Admitting: *Deleted

## 2021-02-26 DIAGNOSIS — D649 Anemia, unspecified: Secondary | ICD-10-CM

## 2021-02-26 NOTE — Telephone Encounter (Signed)
PC to patient, informed him he has a lab appointment here at [facility] for a T&S tomorrow (02/27/2021) at 8 a.m., and then blood transfusion at [facility] at 9:30.  Patient verbalizes understanding.

## 2021-02-27 ENCOUNTER — Other Ambulatory Visit: Payer: Self-pay

## 2021-02-27 ENCOUNTER — Inpatient Hospital Stay: Payer: Medicare Other | Attending: Oncology

## 2021-02-27 ENCOUNTER — Inpatient Hospital Stay: Payer: Medicare Other

## 2021-02-27 ENCOUNTER — Telehealth: Payer: Self-pay

## 2021-02-27 ENCOUNTER — Telehealth: Payer: Self-pay | Admitting: *Deleted

## 2021-02-27 ENCOUNTER — Encounter: Payer: Self-pay | Admitting: Oncology

## 2021-02-27 DIAGNOSIS — D649 Anemia, unspecified: Secondary | ICD-10-CM

## 2021-02-27 DIAGNOSIS — C61 Malignant neoplasm of prostate: Secondary | ICD-10-CM | POA: Diagnosis present

## 2021-02-27 DIAGNOSIS — D630 Anemia in neoplastic disease: Secondary | ICD-10-CM | POA: Insufficient documentation

## 2021-02-27 DIAGNOSIS — D6481 Anemia due to antineoplastic chemotherapy: Secondary | ICD-10-CM | POA: Diagnosis present

## 2021-02-27 DIAGNOSIS — C7951 Secondary malignant neoplasm of bone: Secondary | ICD-10-CM | POA: Insufficient documentation

## 2021-02-27 LAB — CBC WITH DIFFERENTIAL/PLATELET
Abs Immature Granulocytes: 0.64 10*3/uL — ABNORMAL HIGH (ref 0.00–0.07)
Basophils Absolute: 0 10*3/uL (ref 0.0–0.1)
Basophils Relative: 1 %
Eosinophils Absolute: 0.1 10*3/uL (ref 0.0–0.5)
Eosinophils Relative: 1 %
HCT: 21.2 % — ABNORMAL LOW (ref 39.0–52.0)
Hemoglobin: 7.1 g/dL — ABNORMAL LOW (ref 13.0–17.0)
Immature Granulocytes: 14 %
Lymphocytes Relative: 19 %
Lymphs Abs: 0.9 10*3/uL (ref 0.7–4.0)
MCH: 30.1 pg (ref 26.0–34.0)
MCHC: 33.5 g/dL (ref 30.0–36.0)
MCV: 89.8 fL (ref 80.0–100.0)
Monocytes Absolute: 0.5 10*3/uL (ref 0.1–1.0)
Monocytes Relative: 11 %
Neutro Abs: 2.6 10*3/uL (ref 1.7–7.7)
Neutrophils Relative %: 54 %
Platelets: 66 10*3/uL — ABNORMAL LOW (ref 150–400)
RBC: 2.36 MIL/uL — ABNORMAL LOW (ref 4.22–5.81)
RDW: 20.7 % — ABNORMAL HIGH (ref 11.5–15.5)
WBC: 4.7 10*3/uL (ref 4.0–10.5)
nRBC: 15.3 % — ABNORMAL HIGH (ref 0.0–0.2)

## 2021-02-27 LAB — SAMPLE TO BLOOD BANK

## 2021-02-27 NOTE — Telephone Encounter (Signed)
Call from [facility] advised Jonathan Campbell was told to come to their location for his blood transfusion today. RN advised pt he is scheduled at [facility] location. Pt does not have a phone and is not aware how to get to [facility]. RN gave pt directions to our facility and informed pt she would call us. Pt will be delayed today.

## 2021-02-27 NOTE — Telephone Encounter (Signed)
Mr. Machida came to the [place] at [facility] today to have his type and screen completed in order to receive blood at [facility] in [facility] today at 9:30 am. I faxed over his type and screen to [facility] and Blood Bank to made sure that everything was in order for the patient to receive blood. At 10 am I received a call from Ms. Davis (significant other) advising me that the patient was lost and could not find the [place]. She provided me a number for the patient's brother Pilar Plate) because he was driving the patient to the appointment. I attempted to call Pilar Plate several times with no answer, and I also attempted to call the patient and did not receive an answer on his phone as well. Eventually I was able to reach [place], I advised him that I was only trying to help them get the patient to his appointment and that I had the directions pulled up so that I could assist in directing them to the [place]. Pilar Plate was verbally frustrated and handed the phone to [place] who was also extremely frustrated. I kindly asked the patient to lower his voice and to please stop yelling at me several times. I reiterated that I was only trying to help him get to his appointment and to ease his stress. The patient then proceeded to tell me that his brother didn't have a license and they were now back at his brother's house. The patient remained frustrated with me throughout the phone call and cussed at me despite my efforts. I ended the call with Mr. Garnett. I have made Laurence Compton aware of the situation.

## 2021-02-27 NOTE — Telephone Encounter (Signed)
[name] called back this afternoon apologizing on behalf of the patient and asked if it would be possible for him to come back in soon for the blood transfusion. With approval from Laurence Compton and Dow Adolph, we arranged for the patient to come in tomorrow at 8 am for the transfusion. I advised Ms. Rosana Hoes that she would need to have the patient here by 7:50 am and no later than 8 am, she verbalized understanding and confirms that he has left his blood bank bracelet on.

## 2021-02-28 ENCOUNTER — Inpatient Hospital Stay: Payer: Medicare Other

## 2021-02-28 DIAGNOSIS — D649 Anemia, unspecified: Secondary | ICD-10-CM

## 2021-02-28 DIAGNOSIS — C61 Malignant neoplasm of prostate: Secondary | ICD-10-CM | POA: Diagnosis not present

## 2021-02-28 LAB — PREPARE RBC (CROSSMATCH)

## 2021-02-28 LAB — ABO/RH: ABO/RH(D): A POS

## 2021-02-28 MED ORDER — DIPHENHYDRAMINE HCL 25 MG PO CAPS
25.0000 mg | ORAL_CAPSULE | Freq: Once | ORAL | Status: AC
  Administered 2021-02-28: 25 mg via ORAL

## 2021-02-28 MED ORDER — ACETAMINOPHEN 325 MG PO TABS
650.0000 mg | ORAL_TABLET | Freq: Once | ORAL | Status: AC
  Administered 2021-02-28: 650 mg via ORAL

## 2021-02-28 MED ORDER — DIPHENHYDRAMINE HCL 25 MG PO CAPS
ORAL_CAPSULE | ORAL | Status: AC
  Filled 2021-02-28: qty 1

## 2021-02-28 MED ORDER — SODIUM CHLORIDE 0.9% IV SOLUTION
250.0000 mL | Freq: Once | INTRAVENOUS | Status: AC
  Administered 2021-02-28: 250 mL via INTRAVENOUS

## 2021-02-28 MED ORDER — ACETAMINOPHEN 325 MG PO TABS
ORAL_TABLET | ORAL | Status: AC
  Filled 2021-02-28: qty 2

## 2021-02-28 MED ORDER — SODIUM CHLORIDE 0.9 % IV SOLN: Freq: Once | INTRAVENOUS | Status: AC

## 2021-02-28 NOTE — Patient Instructions (Signed)
Blood Transfusion, Adult, Care After This sheet gives you information about how to care for yourself after your procedure. Your doctor may also give you more specific instructions. If you have problems or questions, contact your doctor. What can I expect after the procedure? After the procedure, it is common to have: Bruising and soreness at the IV site. A headache. Follow these instructions at home: Insertion site care   Follow instructions from your doctor about how to take care of your insertion site. This is where an IV tube was put into your vein. Make sure you: Wash your hands with soap and water before and after you change your bandage (dressing). If you cannot use soap and water, use hand sanitizer. Change your bandage as told by your doctor. Check your insertion site every day for signs of infection. Check for: Redness, swelling, or pain. Bleeding from the site. Warmth. Pus or a bad smell. General instructions Take over-the-counter and prescription medicines only as told by your doctor. Rest as told by your doctor. Go back to your normal activities as told by your doctor. Keep all follow-up visits as told by your doctor. This is important. Contact a doctor if: You have itching or red, swollen areas of skin (hives). You feel worried or nervous (anxious). You feel weak after doing your normal activities. You have redness, swelling, warmth, or pain around the insertion site. You have blood coming from the insertion site, and the blood does not stop with pressure. You have pus or a bad smell coming from the insertion site. Get help right away if: You have signs of a serious reaction. This may be coming from an allergy or the body's defense system (immune system). Signs include: Trouble breathing or shortness of breath. Swelling of the face or feeling warm (flushed). Fever or chills. Head, chest, or back pain. Dark pee (urine) or blood in the pee. Widespread rash. Fast  heartbeat. Feeling dizzy or light-headed. You may receive your blood transfusion in an outpatient setting. If so, you will be told whom to contact to report any reactions. These symptoms may be an emergency. Do not wait to see if the symptoms will go away. Get medical help right away. Call your local emergency services (911 in the [place] Do not drive yourself to the hospital. Summary Bruising and soreness at the IV site are common. Check your insertion site every day for signs of infection. Rest as told by your doctor. Go back to your normal activities as told by your doctor. Get help right away if you have signs of a serious reaction. This information is not intended to replace advice given to you by your health care provider. Make sure you discuss any questions you have with your health care provider. Document Revised: 10/09/2020 Document Reviewed: 12/07/2018 Elsevier Patient Education  2022 Elsevier Inc.  

## 2021-03-02 LAB — BPAM RBC
Blood Product Expiration Date: 202209282359
Blood Product Expiration Date: 202209282359
ISSUE DATE / TIME: 202209030854
ISSUE DATE / TIME: 202209030854
Unit Type and Rh: 6200
Unit Type and Rh: 6200

## 2021-03-02 LAB — TYPE AND SCREEN
ABO/RH(D): A POS
Antibody Screen: NEGATIVE
Unit division: 0
Unit division: 0

## 2021-03-03 ENCOUNTER — Telehealth: Payer: Self-pay | Admitting: Radiology

## 2021-03-03 ENCOUNTER — Other Ambulatory Visit: Payer: Self-pay | Admitting: Radiation Oncology

## 2021-03-03 ENCOUNTER — Telehealth: Payer: Self-pay | Admitting: *Deleted

## 2021-03-03 ENCOUNTER — Encounter: Payer: Self-pay | Admitting: Oncology

## 2021-03-03 DIAGNOSIS — C61 Malignant neoplasm of prostate: Secondary | ICD-10-CM

## 2021-03-03 DIAGNOSIS — C7951 Secondary malignant neoplasm of bone: Secondary | ICD-10-CM

## 2021-03-03 MED ORDER — XTAMPZA ER 18 MG PO C12A: 18.0000 mg | EXTENDED_RELEASE_CAPSULE | Freq: Two times a day (BID) | ORAL | 0 refills | Status: DC

## 2021-03-03 NOTE — Telephone Encounter (Signed)
Notified patient of script sent to pharmacy. 

## 2021-03-03 NOTE — Telephone Encounter (Signed)
CALLED PATIENT TO INFORM OF MRI FOR 03-06-21- ARRIVAL TIME- 7:30 PM @ [facility] ADMITTING, PATIENT TO HAVE SCAN @ 8 PM, NO RESTRICTIONS TO TEST, PATIENT TO RECEIVE MRI RESULTS FROM DR. KINARD ON 03-09-21 @ 1 PM, SPOKE WITH PATIENT AND HE IS AWARE OF THESE APPTS.

## 2021-03-03 NOTE — Telephone Encounter (Signed)
Notified patient that Dr Sondra Come would like him to have an MRI of his shoulder and will see him afterward for results. Patient verbalized understanding and expressed gratitude.

## 2021-03-03 NOTE — Telephone Encounter (Signed)
Patient requests refill of Xtampza. 

## 2021-03-04 ENCOUNTER — Telehealth: Payer: Self-pay | Admitting: *Deleted

## 2021-03-04 NOTE — Telephone Encounter (Signed)
CALLED PATIENT TO REMIND OF LAB AND WEIGHT ON 9-8-'22 2 12 '$ PM @ [facility], LVM FOR A RETURN CALL

## 2021-03-05 ENCOUNTER — Ambulatory Visit
Admission: RE | Admit: 2021-03-05 | Discharge: 2021-03-05 | Disposition: A | Discharge status: Home / Self Care | Payer: Medicare Other | Source: Ambulatory Visit | Attending: Radiation Oncology | Admitting: Radiation Oncology

## 2021-03-05 ENCOUNTER — Telehealth: Payer: Self-pay | Admitting: Radiology

## 2021-03-05 ENCOUNTER — Other Ambulatory Visit: Payer: Self-pay

## 2021-03-05 DIAGNOSIS — C7951 Secondary malignant neoplasm of bone: Secondary | ICD-10-CM | POA: Diagnosis present

## 2021-03-05 DIAGNOSIS — C61 Malignant neoplasm of prostate: Secondary | ICD-10-CM | POA: Diagnosis present

## 2021-03-05 DIAGNOSIS — D649 Anemia, unspecified: Secondary | ICD-10-CM | POA: Diagnosis present

## 2021-03-05 LAB — CBC WITH DIFFERENTIAL (CANCER CENTER ONLY)
Abs Immature Granulocytes: 0.55 10*3/uL — ABNORMAL HIGH (ref 0.00–0.07)
Basophils Absolute: 0 10*3/uL (ref 0.0–0.1)
Basophils Relative: 1 %
Eosinophils Absolute: 0.1 10*3/uL (ref 0.0–0.5)
Eosinophils Relative: 1 %
HCT: 26.9 % — ABNORMAL LOW (ref 39.0–52.0)
Hemoglobin: 8.9 g/dL — ABNORMAL LOW (ref 13.0–17.0)
Immature Granulocytes: 12 %
Lymphocytes Relative: 22 %
Lymphs Abs: 1.1 10*3/uL (ref 0.7–4.0)
MCH: 29.1 pg (ref 26.0–34.0)
MCHC: 33.1 g/dL (ref 30.0–36.0)
MCV: 87.9 fL (ref 80.0–100.0)
Monocytes Absolute: 0.6 10*3/uL (ref 0.1–1.0)
Monocytes Relative: 12 %
Neutro Abs: 2.6 10*3/uL (ref 1.7–7.7)
Neutrophils Relative %: 52 %
Platelet Count: 69 10*3/uL — ABNORMAL LOW (ref 150–400)
RBC: 3.06 MIL/uL — ABNORMAL LOW (ref 4.22–5.81)
RDW: 19.2 % — ABNORMAL HIGH (ref 11.5–15.5)
WBC Count: 4.8 10*3/uL (ref 4.0–10.5)
nRBC: 12.3 % — ABNORMAL HIGH (ref 0.0–0.2)

## 2021-03-05 LAB — CMP (CANCER CENTER ONLY)
ALT: 9 U/L (ref 0–44)
AST: 25 U/L (ref 15–41)
Albumin: 3.3 g/dL — ABNORMAL LOW (ref 3.5–5.0)
Alkaline Phosphatase: 146 U/L — ABNORMAL HIGH (ref 38–126)
Anion gap: 10 (ref 5–15)
BUN: 14 mg/dL (ref 6–20)
CO2: 21 mmol/L — ABNORMAL LOW (ref 22–32)
Calcium: 9.4 mg/dL (ref 8.9–10.3)
Chloride: 109 mmol/L (ref 98–111)
Creatinine: 1.33 mg/dL — ABNORMAL HIGH (ref 0.61–1.24)
GFR, Estimated: >60 mL/min (ref >60)
Glucose, Bld: 112 mg/dL — ABNORMAL HIGH (ref 70–99)
Potassium: 3.8 mmol/L (ref 3.5–5.1)
Sodium: 140 mmol/L (ref 135–145)
Total Bilirubin: 0.7 mg/dL (ref 0.3–1.2)
Total Protein: 8.1 g/dL (ref 6.5–8.1)

## 2021-03-05 LAB — SAMPLE TO BLOOD BANK

## 2021-03-05 NOTE — Telephone Encounter (Signed)
Patient requests refill of oxycodone '10mg'$ .

## 2021-03-06 ENCOUNTER — Encounter: Payer: Self-pay | Admitting: Oncology

## 2021-03-06 ENCOUNTER — Ambulatory Visit (HOSPITAL_COMMUNITY)
Admission: RE | Admit: 2021-03-06 | Discharge: 2021-03-06 | Disposition: A | Discharge status: Home / Self Care | Payer: Medicare Other | Source: Ambulatory Visit | Attending: Radiation Oncology | Admitting: Radiation Oncology

## 2021-03-06 ENCOUNTER — Other Ambulatory Visit: Payer: Self-pay | Admitting: Radiation Oncology

## 2021-03-06 DIAGNOSIS — C7951 Secondary malignant neoplasm of bone: Secondary | ICD-10-CM | POA: Insufficient documentation

## 2021-03-06 DIAGNOSIS — C7989 Secondary malignant neoplasm of other specified sites: Secondary | ICD-10-CM

## 2021-03-06 DIAGNOSIS — C61 Malignant neoplasm of prostate: Secondary | ICD-10-CM | POA: Diagnosis present

## 2021-03-06 LAB — PROSTATE-SPECIFIC AG, SERUM (LABCORP): Prostate Specific Ag, Serum: 4320 ng/mL — ABNORMAL HIGH (ref 0.0–4.0)

## 2021-03-06 MED ORDER — GADOBUTROL 1 MMOL/ML IV SOLN
8.0000 mL | Freq: Once | INTRAVENOUS | Status: AC | PRN
  Administered 2021-03-06: 8 mL via INTRAVENOUS

## 2021-03-06 MED ORDER — OXYCODONE HCL 10 MG PO TABS
20.0000 mg | ORAL_TABLET | Freq: Every day | ORAL | 0 refills | Status: DC
Start: 2021-03-06 — End: 2021-04-06

## 2021-03-07 ENCOUNTER — Other Ambulatory Visit (HOSPITAL_COMMUNITY): Payer: Self-pay

## 2021-03-09 ENCOUNTER — Ambulatory Visit
Admission: RE | Admit: 2021-03-09 | Discharge: 2021-03-09 | Disposition: A | Discharge status: Home / Self Care | Payer: Medicare Other | Source: Ambulatory Visit | Attending: Radiation Oncology | Admitting: Radiation Oncology

## 2021-03-11 ENCOUNTER — Telehealth: Payer: Self-pay | Admitting: *Deleted

## 2021-03-11 NOTE — Telephone Encounter (Signed)
Called patient to remind of Xofigo for 03-12-21- arrival time- 11:45 am @ [facility], unable to leave message due to vm being full

## 2021-03-12 ENCOUNTER — Ambulatory Visit (HOSPITAL_COMMUNITY)
Admission: RE | Admit: 2021-03-12 | Discharge: 2021-03-12 | Disposition: A | Discharge status: Home / Self Care | Payer: Medicare Other | Source: Ambulatory Visit | Attending: Radiation Oncology | Admitting: Radiation Oncology

## 2021-03-12 ENCOUNTER — Other Ambulatory Visit: Payer: Self-pay

## 2021-03-12 DIAGNOSIS — C61 Malignant neoplasm of prostate: Secondary | ICD-10-CM | POA: Insufficient documentation

## 2021-03-12 MED ORDER — RADIUM RA 223 DICHLORIDE 30 MCCI/ML IV SOLN
124.8000 | Freq: Once | INTRAVENOUS | Status: AC | PRN
  Administered 2021-03-12: 124.8 via INTRAVENOUS

## 2021-03-13 ENCOUNTER — Other Ambulatory Visit (HOSPITAL_COMMUNITY): Payer: Self-pay | Admitting: Radiation Oncology

## 2021-03-13 DIAGNOSIS — C61 Malignant neoplasm of prostate: Secondary | ICD-10-CM

## 2021-03-13 DIAGNOSIS — C7951 Secondary malignant neoplasm of bone: Secondary | ICD-10-CM

## 2021-03-16 ENCOUNTER — Ambulatory Visit: Payer: Medicare Other | Admitting: Radiation Oncology

## 2021-03-17 ENCOUNTER — Other Ambulatory Visit (HOSPITAL_COMMUNITY): Payer: Self-pay

## 2021-03-23 ENCOUNTER — Telehealth: Payer: Self-pay | Admitting: Radiology

## 2021-03-23 ENCOUNTER — Other Ambulatory Visit: Payer: Self-pay | Admitting: Radiation Oncology

## 2021-03-23 MED ORDER — XTAMPZA ER 18 MG PO C12A: 18.0000 mg | EXTENDED_RELEASE_CAPSULE | Freq: Two times a day (BID) | ORAL | 0 refills | Status: DC

## 2021-03-23 NOTE — Telephone Encounter (Signed)
Patient requests a refill of Xtampza to be sent to [facility].

## 2021-03-24 ENCOUNTER — Encounter: Payer: Self-pay | Admitting: Oncology

## 2021-04-03 ENCOUNTER — Telehealth: Payer: Self-pay | Admitting: Radiology

## 2021-04-03 ENCOUNTER — Telehealth: Payer: Self-pay | Admitting: *Deleted

## 2021-04-03 ENCOUNTER — Other Ambulatory Visit: Payer: Self-pay | Admitting: Radiology

## 2021-04-03 DIAGNOSIS — C61 Malignant neoplasm of prostate: Secondary | ICD-10-CM

## 2021-04-03 DIAGNOSIS — C7951 Secondary malignant neoplasm of bone: Secondary | ICD-10-CM

## 2021-04-03 NOTE — Telephone Encounter (Signed)
Patient requests refill of oxycodone 10mg  be sent to [facility].

## 2021-04-03 NOTE — Telephone Encounter (Signed)
CALLED PATIENT TO REMIND OF LAB AND WEIGHT FOR 04-06-21 @ 12 PM @ [place], SPOKE WITH PATIENT AND HE IS AWARE OF THIS APPT.

## 2021-04-06 ENCOUNTER — Other Ambulatory Visit: Payer: Self-pay | Admitting: Radiation Oncology

## 2021-04-06 ENCOUNTER — Other Ambulatory Visit: Payer: Self-pay

## 2021-04-06 ENCOUNTER — Encounter: Payer: Self-pay | Admitting: Oncology

## 2021-04-06 ENCOUNTER — Ambulatory Visit: Payer: Medicare Other

## 2021-04-06 ENCOUNTER — Other Ambulatory Visit (HOSPITAL_COMMUNITY): Payer: Self-pay

## 2021-04-06 ENCOUNTER — Ambulatory Visit
Admission: RE | Admit: 2021-04-06 | Discharge: 2021-04-06 | Disposition: A | Discharge status: Home / Self Care | Payer: Medicare Other | Source: Ambulatory Visit | Attending: Radiation Oncology | Admitting: Radiation Oncology

## 2021-04-06 DIAGNOSIS — C7989 Secondary malignant neoplasm of other specified sites: Secondary | ICD-10-CM

## 2021-04-06 DIAGNOSIS — D649 Anemia, unspecified: Secondary | ICD-10-CM | POA: Insufficient documentation

## 2021-04-06 DIAGNOSIS — C61 Malignant neoplasm of prostate: Secondary | ICD-10-CM | POA: Insufficient documentation

## 2021-04-06 DIAGNOSIS — C7951 Secondary malignant neoplasm of bone: Secondary | ICD-10-CM | POA: Insufficient documentation

## 2021-04-06 LAB — CMP (CANCER CENTER ONLY)
ALT: 9 U/L (ref 0–44)
AST: 34 U/L (ref 15–41)
Albumin: 3.3 g/dL — ABNORMAL LOW (ref 3.5–5.0)
Alkaline Phosphatase: 146 U/L — ABNORMAL HIGH (ref 38–126)
Anion gap: 11 (ref 5–15)
BUN: 31 mg/dL — ABNORMAL HIGH (ref 6–20)
CO2: 19 mmol/L — ABNORMAL LOW (ref 22–32)
Calcium: 9.4 mg/dL (ref 8.9–10.3)
Chloride: 110 mmol/L (ref 98–111)
Creatinine: 2.61 mg/dL — ABNORMAL HIGH (ref 0.61–1.24)
GFR, Estimated: 27 mL/min — ABNORMAL LOW (ref >60)
Glucose, Bld: 116 mg/dL — ABNORMAL HIGH (ref 70–99)
Potassium: 4.4 mmol/L (ref 3.5–5.1)
Sodium: 140 mmol/L (ref 135–145)
Total Bilirubin: 0.6 mg/dL (ref 0.3–1.2)
Total Protein: 8.1 g/dL (ref 6.5–8.1)

## 2021-04-06 LAB — CBC WITH DIFFERENTIAL (CANCER CENTER ONLY)
Abs Immature Granulocytes: 0.38 10*3/uL — ABNORMAL HIGH (ref 0.00–0.07)
Basophils Absolute: 0 10*3/uL (ref 0.0–0.1)
Basophils Relative: 1 %
Eosinophils Absolute: 0 10*3/uL (ref 0.0–0.5)
Eosinophils Relative: 1 %
HCT: 21.1 % — ABNORMAL LOW (ref 39.0–52.0)
Hemoglobin: 7.2 g/dL — ABNORMAL LOW (ref 13.0–17.0)
Immature Granulocytes: 9 %
Lymphocytes Relative: 20 %
Lymphs Abs: 0.8 10*3/uL (ref 0.7–4.0)
MCH: 29.4 pg (ref 26.0–34.0)
MCHC: 34.1 g/dL (ref 30.0–36.0)
MCV: 86.1 fL (ref 80.0–100.0)
Monocytes Absolute: 0.4 10*3/uL (ref 0.1–1.0)
Monocytes Relative: 10 %
Neutro Abs: 2.4 10*3/uL (ref 1.7–7.7)
Neutrophils Relative %: 59 %
Platelet Count: 56 10*3/uL — ABNORMAL LOW (ref 150–400)
RBC: 2.45 MIL/uL — ABNORMAL LOW (ref 4.22–5.81)
RDW: 18.8 % — ABNORMAL HIGH (ref 11.5–15.5)
WBC Count: 4.1 10*3/uL (ref 4.0–10.5)
nRBC: 10.3 % — ABNORMAL HIGH (ref 0.0–0.2)

## 2021-04-06 LAB — SAMPLE TO BLOOD BANK

## 2021-04-06 MED ORDER — OXYCODONE HCL 10 MG PO TABS: 20.0000 mg | ORAL_TABLET | Freq: Every day | ORAL | 0 refills | Status: DC

## 2021-04-07 LAB — PROSTATE-SPECIFIC AG, SERUM (LABCORP): Prostate Specific Ag, Serum: 4301 ng/mL — ABNORMAL HIGH (ref 0.0–4.0)

## 2021-04-09 LAB — GUARDANT 360

## 2021-04-10 ENCOUNTER — Telehealth: Payer: Self-pay | Admitting: *Deleted

## 2021-04-10 NOTE — Telephone Encounter (Signed)
CALLED PATIENT TO REMIND OF XOFIGO INJ. FOR 04-20-21- ARRIVAL TIME- 11:45 AM @ [facility], SPOKE WITH PATIENT AND HE IS AWARE OF THIS INJ.

## 2021-04-13 ENCOUNTER — Other Ambulatory Visit (HOSPITAL_COMMUNITY): Payer: Self-pay

## 2021-04-13 ENCOUNTER — Other Ambulatory Visit: Payer: Self-pay | Admitting: Oncology

## 2021-04-13 ENCOUNTER — Ambulatory Visit (HOSPITAL_COMMUNITY)
Admission: RE | Admit: 2021-04-13 | Discharge: 2021-04-13 | Disposition: A | Discharge status: Home / Self Care | Payer: Medicare Other | Source: Ambulatory Visit | Attending: Radiation Oncology | Admitting: Radiation Oncology

## 2021-04-13 ENCOUNTER — Other Ambulatory Visit: Payer: Self-pay

## 2021-04-13 DIAGNOSIS — C61 Malignant neoplasm of prostate: Secondary | ICD-10-CM | POA: Insufficient documentation

## 2021-04-13 DIAGNOSIS — C7951 Secondary malignant neoplasm of bone: Secondary | ICD-10-CM | POA: Diagnosis present

## 2021-04-13 MED ORDER — RADIUM RA 223 DICHLORIDE 30 MCCI/ML IV SOLN
112.6000 | Freq: Once | INTRAVENOUS | Status: AC | PRN
  Administered 2021-04-13: 112.6 via INTRAVENOUS

## 2021-04-14 ENCOUNTER — Ambulatory Visit: Payer: Self-pay | Admitting: Radiation Oncology

## 2021-04-14 ENCOUNTER — Other Ambulatory Visit (HOSPITAL_COMMUNITY): Payer: Self-pay | Admitting: Radiation Oncology

## 2021-04-14 ENCOUNTER — Telehealth: Payer: Self-pay | Admitting: *Deleted

## 2021-04-14 ENCOUNTER — Other Ambulatory Visit (HOSPITAL_COMMUNITY): Payer: Self-pay

## 2021-04-14 ENCOUNTER — Encounter: Payer: Self-pay | Admitting: Oncology

## 2021-04-14 DIAGNOSIS — C7951 Secondary malignant neoplasm of bone: Secondary | ICD-10-CM

## 2021-04-14 DIAGNOSIS — C61 Malignant neoplasm of prostate: Secondary | ICD-10-CM

## 2021-04-14 MED ORDER — ENZALUTAMIDE 80 MG PO TABS
ORAL_TABLET | Freq: Every day | ORAL | 0 refills | Status: DC
  Filled 2021-04-14 – 2021-05-07 (×2): qty 60, 30d supply, fill #0

## 2021-04-14 NOTE — Progress Notes (Signed)
  Radiation Oncology         [phone]) [phone] ________________________________  Name: Jonathan Campbell MRN: [id number]  Date: 03/12/2021 DOB: [birth year]  Radium-223 Infusion Note  Diagnosis:  Castration resistant prostate cancer with painful bone involvement  Current Infusion:    4  Planned Infusions:  6  Narrative: Mr. Jonathan Campbell presented to nuclear medicine for treatment. His most recent blood counts were reviewed.  He remains a good candidate to proceed with Ra-223.  The patient was situated in an infusion suite with a contact barrier placed under his arm. Intravenous access was established, using sterile technique, and a normal saline infusion from a syringe was started.  Micro-dosimetry:  The prescribed radiation activity was assayed and confirmed to be within specified tolerance.  Special Treatment Procedure - Infusion:  The nuclear medicine technologist and I personally verified the dose activity to be delivered as specified in the written directive, and verified the patient identification via 2 separate methods.  The syringe containing the dose was attached to a 3 way stopcock, and then the valve was opened to the patient, and the dose delivered over a minute. No complications were noted.  The total administered dose was 127.4 microcuries in a volume of 7.32 cc.  A saline flush of the line and the syringe that contained the isotope was then performed.  The residual radioactivity in the syringe was 2.6 microcuries, so the actual infused isotope activity was 120.1 microcuries.   Pressure was applied to the venipuncture site, and a compression bandage placed.   Radiation Safety personnel were present to perform the discharge survey, as detailed on their documentation.   After a short period of observation, the patient had his IV removed.  Impression:  The patient tolerated his infusion relatively well.  Plan:  The patient will return in one month for ongoing care.     -----------------------------------  Blair Promise, PhD, MD

## 2021-04-14 NOTE — Progress Notes (Addendum)
  Radiation Oncology         (336) [phone] ________________________________  Name: Jonathan Campbell MRN: [id number]  Date: 04/13/2021  DOB: [birth year]  Radium-223 Infusion Note  Diagnosis:  Castration resistant prostate cancer with painful bone involvement  Current Infusion:    5th   Planned Infusions:  6  Narrative: [name] presented to nuclear medicine for treatment. His most recent blood counts were reviewed.  He remains a good candidate to proceed with Ra-223.  The patient was situated in an infusion suite with a contact barrier placed under his arm. Intravenous access was established, using sterile technique, and a normal saline infusion from a syringe was started.  Micro-dosimetry:  The prescribed radiation activity was assayed and confirmed to be within specified tolerance.  Special Treatment Procedure - Infusion:  The nuclear medicine technologist and I personally verified the dose activity to be delivered as specified in the written directive, and verified the patient identification via 2 separate methods.  The syringe containing the dose was attached to a 3 way stopcock, and then the valve was opened to the patient, and the dose delivered over a minute. No complications were noted.  The total administered dose was 115 microcuries in a volume of 15.9 cc.  A saline flush of the line and the syringe that contained the isotope was then performed.  The residual radioactivity in the syringe was 2.6 microcuries, so the actual infused isotope activity was 112.6 microcuries.   Pressure was applied to the venipuncture site, and a compression bandage placed.   Radiation Safety personnel were present to perform the discharge survey, as detailed on their documentation.   After a short period of observation, the patient had his IV removed.  Impression:  The patient tolerated his infusion relatively well.  Plan:  The patient will return in one month for ongoing care.     -----------------------------------  Blair Promise, PhD, MD

## 2021-04-14 NOTE — Telephone Encounter (Signed)
CALLED PATIENT TO INFORM OF LAB AND WEIGHT APPT. FOR 05-07-21 - 12 PM @ [place] AND HIS XOFIGO INJ.FOR 05-14-21- ARRIVAL TIME- 11:45 AM @ [facility], SPOKE WITH PATIENT AND HE IS AWARE OF THESE APPTS.

## 2021-04-22 ENCOUNTER — Other Ambulatory Visit (HOSPITAL_COMMUNITY): Payer: Self-pay

## 2021-04-27 ENCOUNTER — Telehealth: Payer: Self-pay | Admitting: Radiology

## 2021-04-27 NOTE — Telephone Encounter (Signed)
Patient requests refill of Xtampza to be sent to [facility].

## 2021-04-28 ENCOUNTER — Other Ambulatory Visit: Payer: Self-pay | Admitting: Radiation Oncology

## 2021-04-28 ENCOUNTER — Encounter: Payer: Self-pay | Admitting: Oncology

## 2021-04-28 MED ORDER — XTAMPZA ER 18 MG PO C12A: 18.0000 mg | EXTENDED_RELEASE_CAPSULE | Freq: Two times a day (BID) | ORAL | 0 refills | Status: DC

## 2021-05-04 ENCOUNTER — Telehealth: Payer: Self-pay

## 2021-05-04 ENCOUNTER — Other Ambulatory Visit: Payer: Self-pay | Admitting: Radiation Oncology

## 2021-05-04 DIAGNOSIS — C7989 Secondary malignant neoplasm of other specified sites: Secondary | ICD-10-CM

## 2021-05-04 MED ORDER — OXYCODONE HCL 10 MG PO TABS: 20.0000 mg | ORAL_TABLET | Freq: Every day | ORAL | 0 refills | Status: DC

## 2021-05-04 NOTE — Telephone Encounter (Signed)
Patient called in requesting refill on Oxycodone 10 mg. Medication last filled on 04/06/21. Patient requesting medication be sent to [facility].

## 2021-05-06 ENCOUNTER — Telehealth: Payer: Self-pay | Admitting: *Deleted

## 2021-05-06 NOTE — Telephone Encounter (Signed)
CALLED PATIENT TO REMIND OF LAB AND WEIGHT FOR 05-07-21, LVM FOR A RETURN CALL

## 2021-05-07 ENCOUNTER — Telehealth: Payer: Self-pay | Admitting: *Deleted

## 2021-05-07 ENCOUNTER — Other Ambulatory Visit (HOSPITAL_COMMUNITY): Payer: Self-pay

## 2021-05-07 ENCOUNTER — Ambulatory Visit: Payer: Medicare Other

## 2021-05-07 ENCOUNTER — Other Ambulatory Visit: Payer: Self-pay

## 2021-05-07 ENCOUNTER — Other Ambulatory Visit: Payer: Self-pay | Admitting: *Deleted

## 2021-05-07 ENCOUNTER — Telehealth: Payer: Self-pay

## 2021-05-07 ENCOUNTER — Ambulatory Visit
Admission: RE | Admit: 2021-05-07 | Discharge: 2021-05-07 | Disposition: A | Discharge status: Home / Self Care | Payer: Medicare Other | Source: Ambulatory Visit | Attending: Oncology | Admitting: Oncology

## 2021-05-07 DIAGNOSIS — C61 Malignant neoplasm of prostate: Secondary | ICD-10-CM

## 2021-05-07 DIAGNOSIS — D649 Anemia, unspecified: Secondary | ICD-10-CM

## 2021-05-07 DIAGNOSIS — C7951 Secondary malignant neoplasm of bone: Secondary | ICD-10-CM | POA: Insufficient documentation

## 2021-05-07 LAB — CMP (CANCER CENTER ONLY)
ALT: 13 U/L (ref 0–44)
AST: 58 U/L — ABNORMAL HIGH (ref 15–41)
Albumin: 3.4 g/dL — ABNORMAL LOW (ref 3.5–5.0)
Alkaline Phosphatase: 214 U/L — ABNORMAL HIGH (ref 38–126)
Anion gap: 13 (ref 5–15)
BUN: 33 mg/dL — ABNORMAL HIGH (ref 6–20)
CO2: 15 mmol/L — ABNORMAL LOW (ref 22–32)
Calcium: 9 mg/dL (ref 8.9–10.3)
Chloride: 106 mmol/L (ref 98–111)
Creatinine: 2.21 mg/dL — ABNORMAL HIGH (ref 0.61–1.24)
GFR, Estimated: 33 mL/min — ABNORMAL LOW (ref >60)
Glucose, Bld: 130 mg/dL — ABNORMAL HIGH (ref 70–99)
Potassium: 4.3 mmol/L (ref 3.5–5.1)
Sodium: 134 mmol/L — ABNORMAL LOW (ref 135–145)
Total Bilirubin: 1.1 mg/dL (ref 0.3–1.2)
Total Protein: 8.1 g/dL (ref 6.5–8.1)

## 2021-05-07 LAB — CBC WITH DIFFERENTIAL (CANCER CENTER ONLY)
Abs Immature Granulocytes: 0.29 10*3/uL — ABNORMAL HIGH (ref 0.00–0.07)
Basophils Absolute: 0 10*3/uL (ref 0.0–0.1)
Basophils Relative: 1 %
Eosinophils Absolute: 0.1 10*3/uL (ref 0.0–0.5)
Eosinophils Relative: 2 %
HCT: 18.5 % — ABNORMAL LOW (ref 39.0–52.0)
Hemoglobin: 6 g/dL — CL (ref 13.0–17.0)
Immature Granulocytes: 7 %
Lymphocytes Relative: 16 %
Lymphs Abs: 0.6 10*3/uL — ABNORMAL LOW (ref 0.7–4.0)
MCH: 29.4 pg (ref 26.0–34.0)
MCHC: 32.4 g/dL (ref 30.0–36.0)
MCV: 90.7 fL (ref 80.0–100.0)
Monocytes Absolute: 0.7 10*3/uL (ref 0.1–1.0)
Monocytes Relative: 17 %
Neutro Abs: 2.3 10*3/uL (ref 1.7–7.7)
Neutrophils Relative %: 57 %
Platelet Count: 38 10*3/uL — ABNORMAL LOW (ref 150–400)
RBC: 2.04 MIL/uL — ABNORMAL LOW (ref 4.22–5.81)
RDW: 20.8 % — ABNORMAL HIGH (ref 11.5–15.5)
Smear Review: NORMAL
WBC Count: 4 10*3/uL (ref 4.0–10.5)
nRBC: 21.2 % — ABNORMAL HIGH (ref 0.0–0.2)

## 2021-05-07 NOTE — Telephone Encounter (Signed)
PC to patient, no answer, left VM - informed patient his Hgb is 6.0 & he has appointment for 2 units of blood on Saturday, 05/09/21 at 8:30.  Patient instructed to leave blue blood bank bracelet on.  Patient also instructed to call cancer center with any questions/concerns, phone number given.

## 2021-05-07 NOTE — Telephone Encounter (Signed)
CRITICAL VALUE STICKER  CRITICAL VALUE: Hgb = 6.0  RECEIVER (on-site recipient of call): Yetta Glassman, CMA  DATE & TIME NOTIFIED: 05/07/21 at 3:33pm  MESSENGER (representative from lab): Lelan Pons  MD NOTIFIED: Alen Blew  TIME OF NOTIFICATION: 05/07/21 at 3:36pm  RESPONSE: Notification given to Bethena Roys, RN for follow-up.

## 2021-05-07 NOTE — Telephone Encounter (Signed)
CALLED PATIENT SGO TO REMIND OF LAB AND WEIGHT TODAY @ 12 PM @ [place], SPOKE WITH PATIENT'S SGO AND SHE IS AWARE OF THESE APPTS.

## 2021-05-07 NOTE — Telephone Encounter (Signed)
CRITICAL VALUE STICKER  CRITICAL VALUE: Hgb 6.0  RECEIVER (on-site recipient of call): Ansyi CMA  DATE & TIME NOTIFIED: 05/07/21 @ 1535  MESSENGER (representative from lab): Verdis Frederickson  MD NOTIFIED: Dr. Alen Blew  TIME OF NOTIFICATION: [id number]  RESPONSE:  Pt to receive 2 units PRBC's

## 2021-05-07 NOTE — Telephone Encounter (Signed)
RETURNED PATIENT'S PHONE CALL, SPOKE WITH PATIENT. ?

## 2021-05-08 LAB — SAMPLE TO BLOOD BANK

## 2021-05-08 LAB — PREPARE RBC (CROSSMATCH)

## 2021-05-09 ENCOUNTER — Inpatient Hospital Stay: Payer: Medicare Other | Attending: Oncology

## 2021-05-09 LAB — PROSTATE-SPECIFIC AG, SERUM (LABCORP): Prostate Specific Ag, Serum: 6884 ng/mL — ABNORMAL HIGH (ref 0.0–4.0)

## 2021-05-09 NOTE — Progress Notes (Signed)
Spoke with Mrs. Rosana Hoes in this patients contact log and she stated the patient was aware of this appointment today but was "too sick to come". I let her know that his hgb was very low and specific symptoms he may experience and that he may need to seek emergency medical attention and not wait until he gets a call back from our clinic on 11/14 when we open again. She verbalized understanding and said she would discuss it with him. I did message Dr. Alen Blew to let him know that patient did not come today.

## 2021-05-11 ENCOUNTER — Telehealth: Payer: Self-pay | Admitting: *Deleted

## 2021-05-11 LAB — TYPE AND SCREEN
ABO/RH(D): A POS
Antibody Screen: NEGATIVE
Unit division: 0
Unit division: 0

## 2021-05-11 LAB — BPAM RBC
Blood Product Expiration Date: 202212052359
Blood Product Expiration Date: 202212052359
Unit Type and Rh: 6200
Unit Type and Rh: 6200

## 2021-05-11 NOTE — Telephone Encounter (Signed)
CALLED PATIENT TO INFORM THAT XOFIGO INJ. FOR 05-14-21 HAS BEEN CANCELLED PER DR. KINARD, LVM FOR A RETURN CALL

## 2021-05-11 NOTE — Telephone Encounter (Signed)
CALLED PATIENT TO INFORM THAT XOFIGO INJ. WILL NOT BE DONE ON 05-14-21 DUE TO HIS BLOOD COUNTS BEING LOW PER DR. KINARD, SPOKE WITH PATIENT AND HE IS AWARE OF THIS

## 2021-05-14 ENCOUNTER — Ambulatory Visit (HOSPITAL_COMMUNITY): Payer: Medicare Other

## 2021-05-25 ENCOUNTER — Inpatient Hospital Stay: Payer: Medicare Other

## 2021-05-26 ENCOUNTER — Telehealth: Payer: Self-pay | Admitting: *Deleted

## 2021-05-26 ENCOUNTER — Ambulatory Visit: Payer: Medicare Other

## 2021-05-26 NOTE — Telephone Encounter (Signed)
PC to patient's wife, Stanton Kidney, no answer, left VM - informed her patient has missed two lab appointments for bloodwork needed for his scheduled blood transfusion tomorrow 05/27/21.  Instructed her to return call to this office, phone number given.

## 2021-05-26 NOTE — Telephone Encounter (Signed)
PC to patient, informed him he has blood transfusion appointment tomorrow but missed his lab appointment yesterday.  Patient states he can come to the lab today at 1:00, lab informed.

## 2021-05-27 ENCOUNTER — Emergency Department (HOSPITAL_COMMUNITY): Payer: Medicare Other

## 2021-05-27 ENCOUNTER — Telehealth: Payer: Self-pay | Admitting: *Deleted

## 2021-05-27 ENCOUNTER — Inpatient Hospital Stay (HOSPITAL_COMMUNITY)
Admission: EM | Admit: 2021-05-27 | Discharge: 2021-06-02 | DRG: 054 | Disposition: A | Discharge status: Hospice | Payer: Medicare Other | Attending: Internal Medicine | Admitting: Internal Medicine

## 2021-05-27 ENCOUNTER — Other Ambulatory Visit: Payer: Self-pay

## 2021-05-27 ENCOUNTER — Inpatient Hospital Stay: Payer: Medicare Other

## 2021-05-27 ENCOUNTER — Encounter (HOSPITAL_COMMUNITY): Payer: Self-pay

## 2021-05-27 DIAGNOSIS — E872 Acidosis, unspecified: Secondary | ICD-10-CM

## 2021-05-27 DIAGNOSIS — E441 Mild protein-calorie malnutrition: Secondary | ICD-10-CM | POA: Diagnosis present

## 2021-05-27 DIAGNOSIS — E860 Dehydration: Secondary | ICD-10-CM | POA: Diagnosis present

## 2021-05-27 DIAGNOSIS — G9341 Metabolic encephalopathy: Secondary | ICD-10-CM

## 2021-05-27 DIAGNOSIS — N1330 Unspecified hydronephrosis: Secondary | ICD-10-CM | POA: Diagnosis present

## 2021-05-27 DIAGNOSIS — C61 Malignant neoplasm of prostate: Secondary | ICD-10-CM | POA: Diagnosis present

## 2021-05-27 DIAGNOSIS — D696 Thrombocytopenia, unspecified: Secondary | ICD-10-CM | POA: Diagnosis present

## 2021-05-27 DIAGNOSIS — R7989 Other specified abnormal findings of blood chemistry: Secondary | ICD-10-CM | POA: Diagnosis present

## 2021-05-27 DIAGNOSIS — Z9114 Patient's other noncompliance with medication regimen: Secondary | ICD-10-CM

## 2021-05-27 DIAGNOSIS — C7931 Secondary malignant neoplasm of brain: Secondary | ICD-10-CM | POA: Diagnosis not present

## 2021-05-27 DIAGNOSIS — D61818 Other pancytopenia: Secondary | ICD-10-CM | POA: Diagnosis present

## 2021-05-27 DIAGNOSIS — C7951 Secondary malignant neoplasm of bone: Secondary | ICD-10-CM | POA: Diagnosis present

## 2021-05-27 DIAGNOSIS — R823 Hemoglobinuria: Secondary | ICD-10-CM | POA: Diagnosis present

## 2021-05-27 DIAGNOSIS — E871 Hypo-osmolality and hyponatremia: Secondary | ICD-10-CM | POA: Diagnosis present

## 2021-05-27 DIAGNOSIS — D649 Anemia, unspecified: Secondary | ICD-10-CM | POA: Diagnosis present

## 2021-05-27 DIAGNOSIS — Z6820 Body mass index (BMI) 20.0-20.9, adult: Secondary | ICD-10-CM

## 2021-05-27 DIAGNOSIS — G936 Cerebral edema: Secondary | ICD-10-CM | POA: Diagnosis present

## 2021-05-27 DIAGNOSIS — K7689 Other specified diseases of liver: Secondary | ICD-10-CM | POA: Diagnosis present

## 2021-05-27 DIAGNOSIS — R824 Acetonuria: Secondary | ICD-10-CM | POA: Diagnosis present

## 2021-05-27 DIAGNOSIS — Z515 Encounter for palliative care: Secondary | ICD-10-CM

## 2021-05-27 DIAGNOSIS — Z66 Do not resuscitate: Secondary | ICD-10-CM | POA: Diagnosis present

## 2021-05-27 DIAGNOSIS — F17290 Nicotine dependence, other tobacco product, uncomplicated: Secondary | ICD-10-CM | POA: Diagnosis present

## 2021-05-27 DIAGNOSIS — M4854XA Collapsed vertebra, not elsewhere classified, thoracic region, initial encounter for fracture: Secondary | ICD-10-CM | POA: Diagnosis present

## 2021-05-27 DIAGNOSIS — N179 Acute kidney failure, unspecified: Secondary | ICD-10-CM | POA: Diagnosis present

## 2021-05-27 DIAGNOSIS — Z20822 Contact with and (suspected) exposure to covid-19: Secondary | ICD-10-CM | POA: Diagnosis present

## 2021-05-27 DIAGNOSIS — D638 Anemia in other chronic diseases classified elsewhere: Secondary | ICD-10-CM | POA: Diagnosis present

## 2021-05-27 DIAGNOSIS — G939 Disorder of brain, unspecified: Secondary | ICD-10-CM

## 2021-05-27 DIAGNOSIS — G9340 Encephalopathy, unspecified: Secondary | ICD-10-CM | POA: Diagnosis present

## 2021-05-27 DIAGNOSIS — R627 Adult failure to thrive: Secondary | ICD-10-CM | POA: Diagnosis present

## 2021-05-27 DIAGNOSIS — Z79899 Other long term (current) drug therapy: Secondary | ICD-10-CM

## 2021-05-27 DIAGNOSIS — Z91199 Patient's noncompliance with other medical treatment and regimen due to unspecified reason: Secondary | ICD-10-CM

## 2021-05-27 DIAGNOSIS — R590 Localized enlarged lymph nodes: Secondary | ICD-10-CM | POA: Diagnosis present

## 2021-05-27 LAB — CBC WITH DIFFERENTIAL/PLATELET
Abs Immature Granulocytes: 0.2 10*3/uL — ABNORMAL HIGH (ref 0.00–0.07)
Basophils Absolute: 0 10*3/uL (ref 0.0–0.1)
Basophils Relative: 0 %
Eosinophils Absolute: 0 10*3/uL (ref 0.0–0.5)
Eosinophils Relative: 0 %
HCT: 18.6 % — ABNORMAL LOW (ref 39.0–52.0)
Hemoglobin: 6.1 g/dL — CL (ref 13.0–17.0)
Lymphocytes Relative: 15 %
Lymphs Abs: 0.9 10*3/uL (ref 0.7–4.0)
MCH: 31.1 pg (ref 26.0–34.0)
MCHC: 32.8 g/dL (ref 30.0–36.0)
MCV: 94.9 fL (ref 80.0–100.0)
Metamyelocytes Relative: 1 %
Monocytes Absolute: 0.1 10*3/uL (ref 0.1–1.0)
Monocytes Relative: 2 %
Myelocytes: 2 %
Neutro Abs: 4.8 10*3/uL (ref 1.7–7.7)
Neutrophils Relative %: 80 %
Platelets: 42 10*3/uL — ABNORMAL LOW (ref 150–400)
RBC: 1.96 MIL/uL — ABNORMAL LOW (ref 4.22–5.81)
RDW: 20.9 % — ABNORMAL HIGH (ref 11.5–15.5)
WBC: 6 10*3/uL (ref 4.0–10.5)
nRBC: 19.4 % — ABNORMAL HIGH (ref 0.0–0.2)
nRBC: 22 /100 WBC — ABNORMAL HIGH

## 2021-05-27 LAB — COMPREHENSIVE METABOLIC PANEL
ALT: 15 U/L (ref 0–44)
AST: 49 U/L — ABNORMAL HIGH (ref 15–41)
Albumin: 3.4 g/dL — ABNORMAL LOW (ref 3.5–5.0)
Alkaline Phosphatase: 180 U/L — ABNORMAL HIGH (ref 38–126)
Anion gap: 16 — ABNORMAL HIGH (ref 5–15)
BUN: 83 mg/dL — ABNORMAL HIGH (ref 6–20)
CO2: 12 mmol/L — ABNORMAL LOW (ref 22–32)
Calcium: 9.7 mg/dL (ref 8.9–10.3)
Chloride: 103 mmol/L (ref 98–111)
Creatinine, Ser: 3.18 mg/dL — ABNORMAL HIGH (ref 0.61–1.24)
GFR, Estimated: 21 mL/min — ABNORMAL LOW (ref >60)
Glucose, Bld: 130 mg/dL — ABNORMAL HIGH (ref 70–99)
Potassium: 5 mmol/L (ref 3.5–5.1)
Sodium: 131 mmol/L — ABNORMAL LOW (ref 135–145)
Total Bilirubin: 1.7 mg/dL — ABNORMAL HIGH (ref 0.3–1.2)
Total Protein: 8 g/dL (ref 6.5–8.1)

## 2021-05-27 LAB — POC OCCULT BLOOD, ED: Fecal Occult Bld: NEGATIVE

## 2021-05-27 MED ORDER — SODIUM CHLORIDE 0.9 % IV SOLN
10.0000 mL/h | Freq: Once | INTRAVENOUS | Status: AC
  Administered 2021-05-28: 10 mL/h via INTRAVENOUS

## 2021-05-27 MED ORDER — SODIUM CHLORIDE 0.9 % IV BOLUS
1000.0000 mL | Freq: Once | INTRAVENOUS | Status: AC
  Administered 2021-05-27: 1000 mL via INTRAVENOUS

## 2021-05-27 MED ORDER — DEXAMETHASONE SODIUM PHOSPHATE 10 MG/ML IJ SOLN
10.0000 mg | Freq: Once | INTRAMUSCULAR | Status: AC
  Administered 2021-05-28: 10 mg via INTRAVENOUS
  Filled 2021-05-27: qty 1

## 2021-05-27 NOTE — ED Provider Notes (Signed)
Emergency Medicine Provider Triage Evaluation Note  Jonathan Campbell , a 60 y.o. male  was evaluated in triage.  Pt presenting with his fiance with a concern for anemia.  Was supposed to get blood drawn recently but missed this appointment.  Patient with a history of prostate cancer with bone metastases.  Has had multiple blood transfusions in the past.  Was called and told that he needed 2 units of blood today.  Review of Systems  Positive: Shortness of breath Negative: Melena, hematochezia, hematemesis, lightheadedness or syncope  Physical Exam  BP (!) 91/57   Pulse (!) 102   Temp 98.4 F (36.9 C) (Oral)   Resp 16   Ht 5\' 9"  (1.753 m)   Wt 77.1 kg   SpO2 100%   BMI 25.10 kg/m  Gen:   Awake, no distress   Resp:  Normal effort  MSK:   Moves extremities without difficulty  Other:  Tachycardic  Medical Decision Making  Medically screening exam initiated at 8:07 PM.  Appropriate orders placed.  Burnett Spray was informed that the remainder of the evaluation will be completed by another provider, this initial triage assessment does not replace that evaluation, and the importance of remaining in the ED until their evaluation is complete.     Darliss Ridgel 05/27/21 2009    Drenda Freeze, MD 05/27/21 [phone]

## 2021-05-27 NOTE — ED Triage Notes (Signed)
Pt reports fatigue, lack of eating, and not taking medications for prostate cancer. Fiance reports 1  unit blood transfusion a month ago but pt did not follow up for labs. Was supposed to follow up and receive 2 more units of blood but did not due to feeling ill.

## 2021-05-27 NOTE — ED Provider Notes (Addendum)
[facility] DEPT Provider Note   CSN: [id number] Arrival date & time: 05/27/21  1856     History No chief complaint on file.   Jonathan Campbell is a 60 y.o. male hx of prostate cancer with mets to bone, here presenting with anemia, altered mental status.  Patient has a history of prostate cancer and supposed to get transfusions.  He received a transfusion several weeks ago.  However he has been missing multiple oncology appointments.  Patient was noted to be altered and confused.  Patient is unable to give me any history at all.  Patient denies any blood in his stool.  Patient's fianc family was able to bring him in today.  She states that he has been confused at home and has not been eating.   The history is provided by the patient.  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  Social History   Tobacco Use   Smoking status: Light Smoker    Types: Cigars   Smokeless tobacco: Never  Vaping Use   Vaping Use: Never used  Substance Use Topics   Alcohol use: Yes    Comment: occ   Drug use: Yes    Types: Marijuana    Comment: occ    Home Medications Prior to Admission medications   Medication Sig Start Date End Date Taking? Authorizing Provider  acetaminophen (TYLENOL) 500 MG tablet Take 500 mg by mouth daily as needed.    [provider]   enzalutamide (XTANDI) 80 MG tablet TAKE 2 TABLETS BY MOUTH DAILY. 04/14/21 04/14/22  Wyatt Portela, MD  oxyCODONE ER [facility]) 18 MG C12A Take 18 mg by mouth in the morning and at bedtime. 12 hours apart 04/28/21   Gery Pray, MD  Oxycodone HCl 10 MG TABS Take 2 tablets (20 mg total) by mouth 6 (six) times daily. PRN Pain. 05/04/21   Gery Pray, MD  polyethylene glycol (MIRALAX / Floria Raveling) packet Take 17 g by mouth daily. Use OTC Clearax 17 g po daily Patient not taking: Reported on 10/22/2020 09/19/17   Roxan Hockey, MD    Allergies    Patient has no known allergies.  Review of Systems   Review of Systems  Psychiatric/Behavioral:  Positive for confusion.   All other systems reviewed and are negative.  Physical Exam Updated Vital Signs BP (!) 89/51   Pulse 96   Temp 98.4 F (36.9 C) (Oral)   Resp 18   Ht 5\' 9"  (1.753 m)   Wt 77.1 kg   SpO2 100%   BMI 25.10 kg/m   Physical Exam Vitals and nursing note reviewed.  Constitutional:      Comments: Confused and altered  HENT:     Head: Normocephalic.     Nose: Nose  normal.     Mouth/Throat:     Mouth: Mucous membranes are dry.  Eyes:     Extraocular Movements: Extraocular movements intact.     Pupils: Pupils are equal, round, and reactive to light.  Cardiovascular:     Rate and Rhythm: Normal rate and regular rhythm.     Pulses: Normal pulses.     Heart sounds: Normal heart sounds.  Pulmonary:     Effort: Pulmonary effort is normal.     Breath sounds: Normal breath sounds.  Abdominal:     General: Abdomen is flat.     Palpations: Abdomen is soft.  Genitourinary:    Comments: Rectal- no obvious blood in stool  Musculoskeletal:        General: Normal range of motion.     Cervical back: Normal range of motion and neck supple.  Skin:    General: Skin is warm.     Capillary Refill: Capillary refill takes less than 2 seconds.  Neurological:     Comments: Confused and ANO x1.  Patient is moving all  extremities difficulty following commands  Psychiatric:     Comments: Unable     ED Results / Procedures / Treatments   Labs (all labs ordered are listed, but only abnormal results are displayed) Labs Reviewed  CBC WITH DIFFERENTIAL/PLATELET - Abnormal; Notable for the following components:      Result Value   RBC 1.96 (*)    Hemoglobin 6.1 (*)    HCT 18.6 (*)    RDW 20.9 (*)    Platelets 42 (*)    nRBC 19.4 (*)    nRBC 22 (*)    Abs Immature Granulocytes 0.20 (*)    All other components within normal limits  COMPREHENSIVE METABOLIC PANEL - Abnormal; Notable for the following components:   Sodium 131 (*)    CO2 12 (*)    Glucose, Bld 130 (*)    BUN 83 (*)    Creatinine, Ser 3.18 (*)    Albumin 3.4 (*)    AST 49 (*)    Alkaline Phosphatase 180 (*)    Total Bilirubin 1.7 (*)    GFR, Estimated 21 (*)    Anion gap 16 (*)    All other components within normal limits  URINALYSIS, ROUTINE W REFLEX MICROSCOPIC  PATHOLOGIST SMEAR REVIEW  POC OCCULT BLOOD, ED  TYPE AND SCREEN  PREPARE RBC (CROSSMATCH)    EKG None  Radiology CT HEAD WO CONTRAST (5MM)  Result Date: 05/27/2021 CLINICAL DATA:  Encephalopathy EXAM: CT HEAD WITHOUT CONTRAST TECHNIQUE: Contiguous axial images were obtained from the base of the skull through the vertex without intravenous contrast. COMPARISON:  None. FINDINGS: Brain: Vasogenic pattern edema in the right greater than left frontal lobes, likely crossing at the corpus callosum. This is concerning for a high-grade neoplasm. No acute hemorrhage or extra-axial collection. No hydrocephalus. Mild leftward midline shift. Vascular: No abnormal hyperdensity of the major intracranial arteries or dural venous sinuses. No intracranial atherosclerosis. Skull: The visualized skull base, calvarium and extracranial soft tissues are normal. Sinuses/Orbits: No fluid levels or advanced mucosal thickening of the visualized paranasal sinuses. No mastoid or middle ear effusion.  The orbits are normal. IMPRESSION: 1. Vasogenic pattern edema in the right greater than left frontal lobes, likely crossing at the corpus callosum, concerning for a high-grade neoplasm. MRI of the brain with and without contrast is recommended for further characterization. 2. No acute hemorrhage. Electronically Signed   By: Cletus Gash.D.  On: 05/27/2021 23:25   CT Renal Stone Study  Result Date: 05/27/2021 CLINICAL DATA:  Renal failure. History of metastatic prostate cancer. EXAM: CT ABDOMEN AND PELVIS WITHOUT CONTRAST TECHNIQUE: Multidetector CT imaging of the abdomen and pelvis was performed following the standard protocol without IV contrast. COMPARISON:  CT abdomen and pelvis 08/28/2019. FINDINGS: Lower chest: No acute abnormality. Hepatobiliary: There is a rounded 10 cm lesion in the dome of the liver. This has mildly increased in and is now mildly hyperdense when compared to the prior study. Additional innumerable cystic structures throughout the liver are again noted, some of which have mildly increased in size. There is moderate hepatomegaly, mildly increased from prior study. The liver is enlarged, unchanged. Gallbladder appears within normal limits. Pancreas: Unremarkable. No pancreatic ductal dilatation or surrounding inflammatory changes. Spleen: Normal in size without focal abnormality. Adrenals/Urinary Tract: There is mild left and moderate right-sided hydroureteronephrosis to the level the pelvic inlet. No obstructing calculi are seen. No bladder calculi are seen. There is posterior right bladder wall thickening measuring up to 16 mm which is new. Stomach/Bowel: Stomach is within normal limits. Appendix appears normal. No evidence of bowel wall thickening, distention, or inflammatory changes. The appendix is not seen. Vascular/Lymphatic: Aorta is normal in size. There is new left retroperitoneal lymphadenopathy with the largest lymph node measuring 2.4 x 2.7 cm image 504/45. There is new  bulky bilateral iliac chain lymphadenopathy. Discrete margins are difficult to evaluate secondary to lack of contrast and lack of intraperitoneal fat. Largest right soft tissue mass measures 4.2 x 4.1 cm. Largest left sided lymph node measures 3.6 x 2.5 cm. There are new bilateral enlarged inguinal lymph nodes measuring up to 2.1 x 3.3 cm. There also new bilateral enlarged axillary lymph nodes measuring up to 16 mm short axis. Reproductive: Prostate gland is heterogeneous enlarged. This has increased in size and now appears to invade the posterior right aspect of the bladder. Masslike density protruding from the prostate at the right bladder base measures 4.6 x 5.0 cm. Other: There is no ascites or focal abdominal wall hernia. There is bilateral gynecomastia. Musculoskeletal: Vertebroplasty changes are again seen at T12. There are increasing mixed lucent and sclerotic lesions throughout the spine, sacrum and pelvis. No acute fractures are seen. IMPRESSION: 1. Prostate gland has enlarged in the interval and there is a new masslike protrusion invading the right bladder base. There is right bladder wall thickening. Findings are worrisome for prostate cancer with direct extension. 2. There is moderate right-sided hydronephrosis and mild left-sided hydronephrosis to the level of the pelvis. No obstructing calculus. 3. New bulky lymphadenopathy throughout the retroperitoneum, iliac chains and inguinal regions worrisome for metastatic disease or lymphoma. New enlarged bilateral axillary lymph nodes. 4. Moderate hepatomegaly has increased from the prior examination. Numerous hepatic cysts are again seen, some of which have increased in size. Largest lesion in the dome of the liver measures 10 cm and now appears mildly hyperdense. This may be due to interval hemorrhage within this cyst. 5. Mixed lucent and sclerotic lesions throughout the osseous structures compatible with metastatic disease. Electronically Signed   By: Ronney Asters M.D.   On: 05/27/2021 23:26    Procedures Procedures   CRITICAL CARE Performed by: Wandra Arthurs   Total critical care time:30 minutes  Critical care time was exclusive of separately billable procedures and treating other patients.  Critical care was necessary to treat or prevent imminent or life-threatening deterioration.  Critical care was time spent personally  by me on the following activities: development of treatment plan with patient and/or surrogate as well as nursing, discussions with consultants, evaluation of patient's response to treatment, examination of patient, obtaining history from patient or surrogate, ordering and performing treatments and interventions, ordering and review of laboratory studies, ordering and review of radiographic studies, pulse oximetry and re-evaluation of patient's condition.   Medications Ordered in ED Medications  0.9 %  sodium chloride infusion (has no administration in time range)  dexamethasone (DECADRON) injection 10 mg (has no administration in time range)  sodium chloride 0.9 % bolus 1,000 mL (1,000 mLs Intravenous New Bag/Given 05/27/21 2303)    ED Course  I have reviewed the triage vital signs and the nursing notes.  Pertinent labs & imaging results that were available during my care of the patient were reviewed by me and considered in my medical decision making (see chart for details).    MDM Rules/Calculators/A&P                           Baldwin Loyal is a 60 y.o. male here with anemia, confusion, failure to thrive.  Patient has a history of anemia from his prostate cancer.  Patient has been missing multiple appointments and now is not eating much and more confused.  We will get a CT head to rule out mets to the brain.  We will also get CT abdomen pelvis as well and recheck CBC and CMP.   11:33 PM Cr is 3.1.  LFTs are elevated.  Anion gap is 16.  Patient looks dehydrated is hypotensive.  Hemoglobin is also 6.1.  Will  order 2 units PRBC and IV fluids.  Patient CT also showed vasogenic edema with a possible new onset mass.  He had a previous nuclear medicine study that did not show any brain mass.  He also has worsening mets to the liver and hydro to the right side from prostate cancer. At this point, hospitalist to admit.  Likely will need goals of care discussion tomorrow.   12:02 AM Talked to Dr. Myna Hidalgo. Since BP still in the 80s, he recommend reassess after transfusion and repage hospitalist vs ICU depending on the blood pressure. Signed out to Dr. Betsey Holiday in the ED.   Final Clinical Impression(s) / ED Diagnoses Final diagnoses:  None    Rx / DC Orders ED Discharge Orders     None        Drenda Freeze, MD 05/27/21 2336    Drenda Freeze, MD 05/28/21 0002

## 2021-05-27 NOTE — Telephone Encounter (Signed)
PC to patient, no answer, left VM - informed him he had lab appointment at 0745 this morning & blood transfusion following, instructed him to call this office, phone number given.  Also called his wife, Stanton Kidney, no answer.

## 2021-05-28 ENCOUNTER — Encounter (HOSPITAL_COMMUNITY): Payer: Self-pay | Admitting: Internal Medicine

## 2021-05-28 DIAGNOSIS — D649 Anemia, unspecified: Secondary | ICD-10-CM | POA: Diagnosis not present

## 2021-05-28 DIAGNOSIS — E872 Acidosis, unspecified: Secondary | ICD-10-CM | POA: Diagnosis present

## 2021-05-28 DIAGNOSIS — G9341 Metabolic encephalopathy: Secondary | ICD-10-CM | POA: Diagnosis present

## 2021-05-28 DIAGNOSIS — N179 Acute kidney failure, unspecified: Secondary | ICD-10-CM | POA: Diagnosis present

## 2021-05-28 DIAGNOSIS — Z79899 Other long term (current) drug therapy: Secondary | ICD-10-CM | POA: Diagnosis not present

## 2021-05-28 DIAGNOSIS — F17290 Nicotine dependence, other tobacco product, uncomplicated: Secondary | ICD-10-CM | POA: Diagnosis present

## 2021-05-28 DIAGNOSIS — E441 Mild protein-calorie malnutrition: Secondary | ICD-10-CM | POA: Diagnosis present

## 2021-05-28 DIAGNOSIS — G936 Cerebral edema: Secondary | ICD-10-CM | POA: Diagnosis present

## 2021-05-28 DIAGNOSIS — R823 Hemoglobinuria: Secondary | ICD-10-CM | POA: Diagnosis present

## 2021-05-28 DIAGNOSIS — K7689 Other specified diseases of liver: Secondary | ICD-10-CM | POA: Diagnosis present

## 2021-05-28 DIAGNOSIS — N1330 Unspecified hydronephrosis: Secondary | ICD-10-CM | POA: Diagnosis present

## 2021-05-28 DIAGNOSIS — C61 Malignant neoplasm of prostate: Secondary | ICD-10-CM | POA: Diagnosis present

## 2021-05-28 DIAGNOSIS — R627 Adult failure to thrive: Secondary | ICD-10-CM | POA: Diagnosis present

## 2021-05-28 DIAGNOSIS — R7989 Other specified abnormal findings of blood chemistry: Secondary | ICD-10-CM | POA: Diagnosis present

## 2021-05-28 DIAGNOSIS — Z66 Do not resuscitate: Secondary | ICD-10-CM | POA: Diagnosis present

## 2021-05-28 DIAGNOSIS — R824 Acetonuria: Secondary | ICD-10-CM | POA: Diagnosis present

## 2021-05-28 DIAGNOSIS — G9340 Encephalopathy, unspecified: Secondary | ICD-10-CM | POA: Diagnosis present

## 2021-05-28 DIAGNOSIS — C7931 Secondary malignant neoplasm of brain: Secondary | ICD-10-CM | POA: Diagnosis present

## 2021-05-28 DIAGNOSIS — D638 Anemia in other chronic diseases classified elsewhere: Secondary | ICD-10-CM | POA: Diagnosis present

## 2021-05-28 DIAGNOSIS — Z20822 Contact with and (suspected) exposure to covid-19: Secondary | ICD-10-CM | POA: Diagnosis present

## 2021-05-28 DIAGNOSIS — M4854XA Collapsed vertebra, not elsewhere classified, thoracic region, initial encounter for fracture: Secondary | ICD-10-CM | POA: Diagnosis present

## 2021-05-28 DIAGNOSIS — E871 Hypo-osmolality and hyponatremia: Secondary | ICD-10-CM | POA: Diagnosis present

## 2021-05-28 DIAGNOSIS — R590 Localized enlarged lymph nodes: Secondary | ICD-10-CM | POA: Diagnosis present

## 2021-05-28 DIAGNOSIS — Z9114 Patient's other noncompliance with medication regimen: Secondary | ICD-10-CM | POA: Diagnosis not present

## 2021-05-28 DIAGNOSIS — D696 Thrombocytopenia, unspecified: Secondary | ICD-10-CM | POA: Diagnosis present

## 2021-05-28 DIAGNOSIS — Z515 Encounter for palliative care: Secondary | ICD-10-CM | POA: Diagnosis not present

## 2021-05-28 DIAGNOSIS — C7951 Secondary malignant neoplasm of bone: Secondary | ICD-10-CM | POA: Diagnosis present

## 2021-05-28 DIAGNOSIS — E860 Dehydration: Secondary | ICD-10-CM | POA: Diagnosis present

## 2021-05-28 DIAGNOSIS — D61818 Other pancytopenia: Secondary | ICD-10-CM | POA: Diagnosis present

## 2021-05-28 LAB — URINALYSIS, ROUTINE W REFLEX MICROSCOPIC
Bilirubin Urine: NEGATIVE
Glucose, UA: NEGATIVE mg/dL
Ketones, ur: 5 mg/dL — AB
Leukocytes,Ua: NEGATIVE
Nitrite: NEGATIVE
Protein, ur: NEGATIVE mg/dL
Specific Gravity, Urine: 1.011 (ref 1.005–1.030)
pH: 5 (ref 5.0–8.0)

## 2021-05-28 LAB — RESP PANEL BY RT-PCR (FLU A&B, COVID) ARPGX2
Influenza A by PCR: NEGATIVE
Influenza B by PCR: NEGATIVE
SARS Coronavirus 2 by RT PCR: NEGATIVE

## 2021-05-28 LAB — PREPARE RBC (CROSSMATCH)

## 2021-05-28 MED ORDER — LACTATED RINGERS IV SOLN: INTRAVENOUS | Status: DC

## 2021-05-28 MED ORDER — ONDANSETRON HCL 4 MG/2ML IJ SOLN: 4.0000 mg | Freq: Four times a day (QID) | INTRAMUSCULAR | Status: DC | PRN

## 2021-05-28 MED ORDER — DEXAMETHASONE SODIUM PHOSPHATE 4 MG/ML IJ SOLN
4.0000 mg | Freq: Two times a day (BID) | INTRAMUSCULAR | Status: DC
  Administered 2021-05-28 – 2021-06-02 (×10): 4 mg via INTRAVENOUS
  Filled 2021-05-28 (×10): qty 1

## 2021-05-28 MED ORDER — ONDANSETRON HCL 4 MG PO TABS: 4.0000 mg | ORAL_TABLET | Freq: Four times a day (QID) | ORAL | Status: DC | PRN

## 2021-05-28 MED ORDER — LACTATED RINGERS IV BOLUS
1000.0000 mL | Freq: Once | INTRAVENOUS | Status: AC
  Administered 2021-05-28: 1000 mL via INTRAVENOUS

## 2021-05-28 MED ORDER — ACETAMINOPHEN 325 MG PO TABS
650.0000 mg | ORAL_TABLET | Freq: Four times a day (QID) | ORAL | Status: DC | PRN
  Administered 2021-05-28: 650 mg via ORAL
  Filled 2021-05-28: qty 2

## 2021-05-28 MED ORDER — ACETAMINOPHEN 650 MG RE SUPP: 650.0000 mg | Freq: Four times a day (QID) | RECTAL | Status: DC | PRN

## 2021-05-28 NOTE — H&P (Signed)
History and Physical    Jonathan Campbell IBB:[id number] DOB: 11-04-1960 DOA: 05/27/2021  PCP: Sonia Side., FNP  Patient coming from: Home.   I have personally briefly reviewed patient's old medical records in [place]  Chief Complaint: AMS.  HPI: Jonathan Campbell is a 60 y.o. male with medical history significant of prostate cancer with metastasis to bone, T12 compression fracture anemia chronic disease who is brought to the emergency department due to decreased mentation and decreased oral intake for several days.  He is only oriented to self.  He is unable to provide information and history was obtained by calling his significant other Jonathan Campbell.  He has not had a fever, chills or any other complaints that she could tell.  He has not been taking his medications.  He has missed, his appointments.  He was supposed to get a blood transfusion last month but did not follow-up for this appointment.  ED Course: Initial vital signs were temperature 98.4 F, pulse 102, respirations 16, BP 91/57 mmHg and O2 sat 100% on room air.  The patient received 1000 mL of NS bolus and 10 mg of dexamethasone IVP.  Lab work: His urinalysis showed small hemoglobinuria with ketonuria 5 mg/dL and rare bacteria on microscopic examination.  CBC showed a white count of 6.0 with 80% neutrophils, hemoglobin 6.1 g/dL and platelets 42.  Sodium 131, potassium of 5.0, chloride 103 and CO2 of 12 mmol/L with an anion gap of 16.  Glucose 130, BUN 83 and creatinine 3.18 mg/dL.  Albumin was 3.4 g/dL.  AST 49 and alk phos 180 units/L.  Total bilirubin was 1.7 mg/dL.  Imaging: CT head without contrast show vasogenic pattern edema in the right greater than left frontal lobe, likely crossing of the corpus callosum concerning for a high-grade neoplasm.  MRI of the brain with and without contrast is recommended for further characterization.  CT renal stone study showed an enlarged prostate gland.  New masslike protrusion invading the  right bladder base.  There is right bladder wall thickening.  Moderate right-sided hydro nephrosis and mild left-sided hydronephrosis to the level of the pelvis.  No obstructing calculus.  There is a new bulky lymphadenopathies throughout the retroperitoneum, iliac chains and humeral regions worrisome for metastatic disease or lymphoma.  New enlarged axillary lymph nodes.  Moderate hepatomegaly has increased from the prior examination.  Numerous cystic lesions again seen in the liver.  Metastatic bone disease noticed in the osseous structures.  Please see images and full radiology report for further details.  Review of Systems: As per HPI otherwise all other systems reviewed and are negative.  Past Medical History:  Diagnosis Date   Compression fracture of T12 vertebra ([place])    Prostate cancer metastatic to bone [facility])     Past Surgical History:  Procedure Laterality Date   IR BONE TUMOR(S)RF ABLATION  11/22/2017   IR KYPHO THORACIC WITH BONE BIOPSY  11/22/2017   IR RADIOLOGIST EVAL & MGMT  11/09/2017   NO PAST SURGERIES      Social History  reports that he has been smoking cigars. He has never used smokeless tobacco. He reports current alcohol use. He reports current drug use. Drug: Marijuana.  No Known Allergies  Unable to obtain family medical history  Prior to Admission medications   Medication Sig Start Date End Date Taking? Authorizing Provider  acetaminophen (TYLENOL) 500 MG tablet Take 500 mg by mouth daily as needed.   Yes [provider]  enzalutamide (  XTANDI) 80 MG tablet TAKE 2 TABLETS BY MOUTH DAILY. 04/14/21 04/14/22 Yes Wyatt Portela, MD  oxyCODONE ER [facility]) 18 MG C12A Take 18 mg by mouth in the morning and at bedtime. 12 hours apart 04/28/21  Yes Gery Pray, MD  Oxycodone HCl 10 MG TABS Take 2 tablets (20 mg total) by mouth 6 (six) times daily. PRN Pain. 05/04/21  Yes Gery Pray, MD  polyethylene glycol (MIRALAX / GLYCOLAX) packet Take 17 g by mouth  daily. Use OTC Clearax 17 g po daily Patient taking differently: Take 17 g by mouth daily as needed for mild constipation. 09/19/17  Yes Roxan Hockey, MD   Physical Exam: Vitals:   05/28/21 1030 05/28/21 1045 05/28/21 1100 05/28/21 1345  BP: 105/60 112/63 111/60 (!) 99/56  Pulse: 72 72 76 74  Resp: $Remo'12 12 11 18  'AYBtS$ Temp:      TempSrc:      SpO2: 99% 100% 100% 100%  Weight:      Height:       Constitutional: Chronically ill-appearing, but nontoxic.  NAD, calm, comfortable Eyes: PERRL, lids and conjunctivae are pale.  Mildly injected conjunctiva. ENMT: Mucous membranes are dry.  Posterior pharynx clear of any exudate or lesions. Neck: normal, supple, no masses, no thyromegaly Respiratory: clear to auscultation bilaterally, no wheezing, no crackles. Normal respiratory effort. No accessory muscle use.  Cardiovascular: Regular rate and rhythm, no murmurs / rubs / gallops. No extremity edema. 2+ pedal pulses. No carotid bruits.  Abdomen: No distention.  Bowel sounds positive.  Soft, no tenderness, no masses palpated. No hepatosplenomegaly.   Musculoskeletal: Severe generalized weakness.  No clubbing / cyanosis. Good ROM, no contractures. Normal muscle tone.  Skin: Decreased skin turgor.  No obvious acute rashes, lesions, ulcers on very limited dermatological examination. Neurologic: CN 2-12 grossly intact. Sensation intact, DTR normal. Strength 5/5 in all 4.  Psychiatric: Alert and oriented x 1. Normal mood.   Labs on Admission: I have personally reviewed following labs and imaging studies  CBC: Recent Labs  Lab 05/27/21 2112  WBC 6.0  NEUTROABS 4.8  HGB 6.1*  HCT 18.6*  MCV 94.9  PLT 42*    Basic Metabolic Panel: Recent Labs  Lab 05/27/21 2112  NA 131*  K 5.0  CL 103  CO2 12*  GLUCOSE 130*  BUN 83*  CREATININE 3.18*  CALCIUM 9.7    GFR: Estimated Creatinine Clearance: 24.7 mL/min (A) (by C-G formula based on SCr of 3.18 mg/dL (H)).  Liver Function Tests: Recent  Labs  Lab 05/27/21 2112  AST 49*  ALT 15  ALKPHOS 180*  BILITOT 1.7*  PROT 8.0  ALBUMIN 3.4*    Urine analysis:    Component Value Date/Time   COLORURINE YELLOW 05/28/2021 0650   APPEARANCEUR CLEAR 05/28/2021 0650   LABSPEC 1.011 05/28/2021 0650   PHURINE 5.0 05/28/2021 0650   GLUCOSEU NEGATIVE 05/28/2021 0650   HGBUR SMALL (A) 05/28/2021 0650   BILIRUBINUR NEGATIVE 05/28/2021 0650   KETONESUR 5 (A) 05/28/2021 0650   PROTEINUR NEGATIVE 05/28/2021 0650   NITRITE NEGATIVE 05/28/2021 0650   LEUKOCYTESUR NEGATIVE 05/28/2021 0650    Radiological Exams on Admission: CT HEAD WO CONTRAST (5MM)  Result Date: 05/27/2021 CLINICAL DATA:  Encephalopathy EXAM: CT HEAD WITHOUT CONTRAST TECHNIQUE: Contiguous axial images were obtained from the base of the skull through the vertex without intravenous contrast. COMPARISON:  None. FINDINGS: Brain: Vasogenic pattern edema in the right greater than left frontal lobes, likely crossing at the corpus callosum.  This is concerning for a high-grade neoplasm. No acute hemorrhage or extra-axial collection. No hydrocephalus. Mild leftward midline shift. Vascular: No abnormal hyperdensity of the major intracranial arteries or dural venous sinuses. No intracranial atherosclerosis. Skull: The visualized skull base, calvarium and extracranial soft tissues are normal. Sinuses/Orbits: No fluid levels or advanced mucosal thickening of the visualized paranasal sinuses. No mastoid or middle ear effusion. The orbits are normal. IMPRESSION: 1. Vasogenic pattern edema in the right greater than left frontal lobes, likely crossing at the corpus callosum, concerning for a high-grade neoplasm. MRI of the brain with and without contrast is recommended for further characterization. 2. No acute hemorrhage. Electronically Signed   By: Ulyses Jarred M.D.   On: 05/27/2021 23:25   CT Renal Stone Study  Result Date: 05/27/2021 CLINICAL DATA:  Renal failure. History of metastatic  prostate cancer. EXAM: CT ABDOMEN AND PELVIS WITHOUT CONTRAST TECHNIQUE: Multidetector CT imaging of the abdomen and pelvis was performed following the standard protocol without IV contrast. COMPARISON:  CT abdomen and pelvis 08/28/2019. FINDINGS: Lower chest: No acute abnormality. Hepatobiliary: There is a rounded 10 cm lesion in the dome of the liver. This has mildly increased in and is now mildly hyperdense when compared to the prior study. Additional innumerable cystic structures throughout the liver are again noted, some of which have mildly increased in size. There is moderate hepatomegaly, mildly increased from prior study. The liver is enlarged, unchanged. Gallbladder appears within normal limits. Pancreas: Unremarkable. No pancreatic ductal dilatation or surrounding inflammatory changes. Spleen: Normal in size without focal abnormality. Adrenals/Urinary Tract: There is mild left and moderate right-sided hydroureteronephrosis to the level the pelvic inlet. No obstructing calculi are seen. No bladder calculi are seen. There is posterior right bladder wall thickening measuring up to 16 mm which is new. Stomach/Bowel: Stomach is within normal limits. Appendix appears normal. No evidence of bowel wall thickening, distention, or inflammatory changes. The appendix is not seen. Vascular/Lymphatic: Aorta is normal in size. There is new left retroperitoneal lymphadenopathy with the largest lymph node measuring 2.4 x 2.7 cm image 504/45. There is new bulky bilateral iliac chain lymphadenopathy. Discrete margins are difficult to evaluate secondary to lack of contrast and lack of intraperitoneal fat. Largest right soft tissue mass measures 4.2 x 4.1 cm. Largest left sided lymph node measures 3.6 x 2.5 cm. There are new bilateral enlarged inguinal lymph nodes measuring up to 2.1 x 3.3 cm. There also new bilateral enlarged axillary lymph nodes measuring up to 16 mm short axis. Reproductive: Prostate gland is heterogeneous  enlarged. This has increased in size and now appears to invade the posterior right aspect of the bladder. Masslike density protruding from the prostate at the right bladder base measures 4.6 x 5.0 cm. Other: There is no ascites or focal abdominal wall hernia. There is bilateral gynecomastia. Musculoskeletal: Vertebroplasty changes are again seen at T12. There are increasing mixed lucent and sclerotic lesions throughout the spine, sacrum and pelvis. No acute fractures are seen. IMPRESSION: 1. Prostate gland has enlarged in the interval and there is a new masslike protrusion invading the right bladder base. There is right bladder wall thickening. Findings are worrisome for prostate cancer with direct extension. 2. There is moderate right-sided hydronephrosis and mild left-sided hydronephrosis to the level of the pelvis. No obstructing calculus. 3. New bulky lymphadenopathy throughout the retroperitoneum, iliac chains and inguinal regions worrisome for metastatic disease or lymphoma. New enlarged bilateral axillary lymph nodes. 4. Moderate hepatomegaly has increased from the prior examination.  Numerous hepatic cysts are again seen, some of which have increased in size. Largest lesion in the dome of the liver measures 10 cm and now appears mildly hyperdense. This may be due to interval hemorrhage within this cyst. 5. Mixed lucent and sclerotic lesions throughout the osseous structures compatible with metastatic disease. Electronically Signed   By: Ronney Asters M.D.   On: 05/27/2021 23:26    EKG: Independently reviewed.   Assessment/Plan Principal Problem:   AKI (acute kidney injury) ([place]) In the setting of obstructive uropathy. Observation/PCU. Continue IV fluids. Monitor daily weights. Monitor intake and output. Follow renal function electrolytes. Consider urology evaluation if no improvement.  Active Problems:   Acute encephalopathy In the setting of brain metastasis. Continue  dexamethasone. Consult oncology/radiation oncology in a.m. Consider palliative care consult.    Bilateral hydronephrosis In the setting of progression of   Prostate cancer metastatic to bone [facility]) Continue IV fluids. Monitor intake and output.    Symptomatic anemia of chronic disease Transfuse 2 PRBC.  Follow-up H&H.    Thrombocytopenia (HCC) Monitor platelet count.    Abnormal LFTs (liver function tests) In the setting of numerous liver lesions/dehydration. Continue IV hydration and follow LFTs.    Hyponatremia Due to poor oral intake. Continue normal saline infusion.    Mild protein malnutrition (HCC) Protein supplementation as tolerated. Consider nutritionist evaluation.     DVT prophylaxis: SCDs. Code Status:   Full code. Family Communication:  Spoke on the phone to [place], his significant other. Disposition Plan:   Patient is from:  Home.  Anticipated DC to:  TBD.  Anticipated DC date:  05/30/2021 or 05/31/2021.  Anticipated DC barriers: Clinical status. Consults called:   Admission status:  Inpatient/ PCU.   Severity of Illness: High severity after presenting with dehydration due to decreased oral intake in the setting of acute encephalopathy secondary to brain metastasis.  Patient will remain in the hospital for IV hydration, antibiotics and close monitoring.  Reubin Milan MD [facility]  How to contact the [facility] Attending or Consulting provider [place] or covering provider during after hours [place], for this patient?   Check the care team in [facility] and look for a) attending/consulting [facility] provider listed and b) the [facility] team listed Log into [email or web address] and use [place]'s universal password to access. If you do not have the password, please contact the hospital operator. Locate the [facility] provider you are looking for under [facility] and page to a number that you can be directly reached. If you still have difficulty reaching the provider, please  page the [facility] (Director on Call) for the Hospitalists listed on amion for assistance.  05/28/2021, 2:30 PM   This document was prepared using Dragon voice recognition software and may contain some unintended transcription errors.

## 2021-05-28 NOTE — Plan of Care (Signed)
Problem: Clinical Measurements: Goal: Ability to maintain clinical measurements within normal limits will improve Outcome: Progressing Goal: Will remain free from infection Outcome: Progressing Goal: Respiratory complications will improve Outcome: Progressing   Problem: Activity: Goal: Risk for activity intolerance will decrease Outcome: Progressing   

## 2021-05-28 NOTE — ED Notes (Signed)
Pt attached to cardiac monitor x3. VSS. Alert and oriented to person.

## 2021-05-29 ENCOUNTER — Other Ambulatory Visit (HOSPITAL_COMMUNITY): Payer: Self-pay

## 2021-05-29 DIAGNOSIS — G9341 Metabolic encephalopathy: Secondary | ICD-10-CM

## 2021-05-29 DIAGNOSIS — G939 Disorder of brain, unspecified: Secondary | ICD-10-CM

## 2021-05-29 DIAGNOSIS — E872 Acidosis, unspecified: Secondary | ICD-10-CM

## 2021-05-29 LAB — TYPE AND SCREEN
ABO/RH(D): A POS
Antibody Screen: NEGATIVE
Unit division: 0
Unit division: 0

## 2021-05-29 LAB — COMPREHENSIVE METABOLIC PANEL
ALT: 14 U/L (ref 0–44)
AST: 47 U/L — ABNORMAL HIGH (ref 15–41)
Albumin: 3.2 g/dL — ABNORMAL LOW (ref 3.5–5.0)
Alkaline Phosphatase: 168 U/L — ABNORMAL HIGH (ref 38–126)
Anion gap: 13 (ref 5–15)
BUN: 73 mg/dL — ABNORMAL HIGH (ref 6–20)
CO2: 13 mmol/L — ABNORMAL LOW (ref 22–32)
Calcium: 9.1 mg/dL (ref 8.9–10.3)
Chloride: 106 mmol/L (ref 98–111)
Creatinine, Ser: 2.5 mg/dL — ABNORMAL HIGH (ref 0.61–1.24)
GFR, Estimated: 29 mL/min — ABNORMAL LOW (ref >60)
Glucose, Bld: 131 mg/dL — ABNORMAL HIGH (ref 70–99)
Potassium: 4.4 mmol/L (ref 3.5–5.1)
Sodium: 132 mmol/L — ABNORMAL LOW (ref 135–145)
Total Bilirubin: 1.3 mg/dL — ABNORMAL HIGH (ref 0.3–1.2)
Total Protein: 7 g/dL (ref 6.5–8.1)

## 2021-05-29 LAB — HIV ANTIBODY (ROUTINE TESTING W REFLEX): HIV Screen 4th Generation wRfx: NONREACTIVE

## 2021-05-29 LAB — BPAM RBC
Blood Product Expiration Date: 202212202359
Blood Product Expiration Date: 202212202359
ISSUE DATE / TIME: 202212010049
ISSUE DATE / TIME: 202212010352
Unit Type and Rh: 6200
Unit Type and Rh: 6200

## 2021-05-29 MED ORDER — OXYCODONE HCL ER 10 MG PO T12A
10.0000 mg | EXTENDED_RELEASE_TABLET | Freq: Two times a day (BID) | ORAL | Status: DC
  Administered 2021-05-29 – 2021-06-01 (×7): 10 mg via ORAL
  Filled 2021-05-29 (×8): qty 1

## 2021-05-29 MED ORDER — TRAMADOL HCL 50 MG PO TABS
50.0000 mg | ORAL_TABLET | Freq: Once | ORAL | Status: AC
  Administered 2021-05-29: 50 mg via ORAL
  Filled 2021-05-29: qty 1

## 2021-05-29 MED ORDER — SODIUM BICARBONATE 8.4 % IV SOLN
INTRAVENOUS | Status: DC
  Filled 2021-05-29: qty 150
  Filled 2021-05-29 (×3): qty 1000
  Filled 2021-05-29 (×3): qty 150

## 2021-05-29 MED ORDER — OXYCODONE HCL 5 MG PO TABS
10.0000 mg | ORAL_TABLET | ORAL | Status: DC | PRN
  Administered 2021-05-29 – 2021-05-31 (×5): 10 mg via ORAL
  Filled 2021-05-29 (×5): qty 2

## 2021-05-29 NOTE — Assessment & Plan Note (Signed)
NAGMA 2/2 renal failure, start bicarb

## 2021-05-29 NOTE — Assessment & Plan Note (Addendum)
In setting of malignancy S/p 2 units pRBC  6.1 on presentation, 9.2 after transfusion Down to 8.1 today, appropriate after 2 units, continue to trend

## 2021-05-29 NOTE — Progress Notes (Signed)
PROGRESS NOTE    Jonathan Campbell  VOH:[id number] DOB: 1960-11-26 DOA: 05/27/2021 PCP: Jonathan Side., FNP   No chief complaint on file.   Brief Narrative:  60 yo with hx prostate cancer metastatic to bone, T12 compression fracture, anemia of chronic disease who presented to the hospital for increased confusion.  He was admitted with AKI, anemia, and brian imaging concerning for metastatic disease.   Assessment & Plan:   Principal Problem:   Acute metabolic encephalopathy Active Problems:   Anemia of chronic disease   Abnormal LFTs (liver function tests)   Mild protein malnutrition (HCC)   Bilateral hydronephrosis   Acute encephalopathy   AKI (acute kidney injury) ([place])   Symptomatic anemia   Thrombocytopenia (HCC)   Prostate cancer metastatic to bone [facility])   Brain lesion   Hyponatremia   Metabolic acidosis  * Acute metabolic encephalopathy In setting of brain mets CT head with vasogenic pattern edema in R greater than L frontal lobes, likely crossing at corpus callosum concerning for high grade neoplasm MRI pending (GFR 27 in October, will go ahead and pursue without contrast) Dr. Sondra Campbell to evaluate Continue steroids Continue pain meds at reduced (~half dose)  AKI (acute kidney injury) ([place]) In setting of moderate right sided hydro and mild L sided hydro Improving today with blood/volume  Baseline creatinine 1 in August.  In 03/2021 it was 2.61. At admission, 3.18 At this time, will continue conservative management with lack of discomfort, improving renal function at this time Consider intervention pending goals of care   Symptomatic anemia In setting of malignancy S/p 2 units pRBC  6.1 on presentation, 9.2 after transfusion  Thrombocytopenia (HCC) In setting of malignancy No si/sx bleeding, follow  Prostate cancer metastatic to bone [facility]) Appreciate Dr. Hazeline Campbell assistance Currently not candidate for additional chemo, Dr. Alen Campbell recommending more of Jonathan Campbell  palliative approach with supportive care, no additional anticancer treatment  CT with findings concerning for prostate cancer with direct dextension, new bulky LAD within retroperitoneum, iliac chains, and inguinal regions concerning for metastatic disease or lymphoma, moderate hepatomegaly, lucent and sclerotic lesions throughout osseus structures  Brain lesion Concerning for metastatic disease As above, Dr. Sondra Campbell to eval, consider whole brain radiation MRI pending, steroids  Hyponatremia mild  Metabolic acidosis NAGMA 2/2 renal failure, start bicarb     DVT prophylaxis: SCD Code Status: full Family Communication: aunt and sig other at bedside Disposition:   Status is: Inpatient  Remains inpatient appropriate because: need for IV steroids, onc eval       Consultants:  Rad onc Oncology Palliative care  Procedures:  none  Antimicrobials:  Anti-infectives (From admission, onward)    None          Subjective: Denies pain Encephalopathy improved  Objective: Vitals:   05/28/21 2235 05/29/21 0537 05/29/21 0539 05/29/21 1255  BP: 105/62 (!) 96/56  105/62  Pulse: 66 71  69  Resp: 20 16  17   Temp: 98.2 F (36.8 C) 98 F (36.7 C)  (!) 97.5 F (36.4 C)  TempSrc: Oral Oral  Oral  SpO2: 100% 100%  100%  Weight:   63.3 kg   Height:        Intake/Output Summary (Last 24 hours) at 05/29/2021 2013 Last data filed at 05/29/2021 1620 Gross per 24 hour  Intake 2490.04 ml  Output 300 ml  Net 2190.04 ml   Filed Weights   05/27/21 2003 05/29/21 0539  Weight: 77.1 kg 63.3 kg    Examination:  General exam: Appears calm and comfortable  Respiratory system: unlabored Cardiovascular system: RRR Gastrointestinal system: Abdomen is nondistended, soft and nontender. Central nervous system: Alert, confusion improved per sig other. No focal neurological deficits. Extremities: moving all extremities Skin: No rashes, lesions or ulcers Psychiatry: Judgement and  insight appear normal. Mood & affect appropriate.     Data Reviewed: I have personally reviewed following labs and imaging studies  CBC: Recent Labs  Lab 05/27/21 2112 05/29/21 0936  WBC 6.0 3.7*  NEUTROABS 4.8  --   HGB 6.1* 9.2*  HCT 18.6* 27.3*  MCV 94.9 90.7  PLT 42* 27*    Basic Metabolic Panel: Recent Labs  Lab 05/27/21 2112 05/29/21 0936  NA 131* 132*  K 5.0 4.4  CL 103 106  CO2 12* 13*  GLUCOSE 130* 131*  BUN 83* 73*  CREATININE 3.18* 2.50*  CALCIUM 9.7 9.1    GFR: Estimated Creatinine Clearance: 28.1 mL/min (Jonathan Campbell) (by C-G formula based on SCr of 2.5 mg/dL (H)).  Liver Function Tests: Recent Labs  Lab 05/27/21 2112 05/29/21 0936  AST 49* 47*  ALT 15 14  ALKPHOS 180* 168*  BILITOT 1.7* 1.3*  PROT 8.0 7.0  ALBUMIN 3.4* 3.2*    CBG: No results for input(s): GLUCAP in the last 168 hours.   Recent Results (from the past 240 hour(s))  Resp Panel by RT-PCR (Flu Jonathan Campbell&B, Covid) Nasopharyngeal Swab     Status: None   Collection Time: 05/28/21  3:16 PM   Specimen: Nasopharyngeal Swab; Nasopharyngeal(NP) swabs in vial transport medium  Result Value Ref Range Status   SARS Coronavirus 2 by RT PCR NEGATIVE NEGATIVE Final    Comment: (NOTE) SARS-CoV-2 target nucleic acids are NOT DETECTED.  The SARS-CoV-2 RNA is generally detectable in upper respiratory specimens during the acute phase of infection. The lowest concentration of SARS-CoV-2 viral copies this assay can detect is 138 copies/mL. Jonathan Campbell negative result does not preclude SARS-Cov-2 infection and should not be used as the sole basis for treatment or other patient management decisions. Jonathan Campbell negative result may occur with  improper specimen collection/handling, submission of specimen other than nasopharyngeal swab, presence of viral mutation(s) within the areas targeted by this assay, and inadequate number of viral copies(<138 copies/mL). Jonathan Campbell negative result must be combined with clinical observations, patient  history, and epidemiological information. The expected result is Negative.  Fact Sheet for Patients:  EntrepreneurPulse.com.au  Fact Sheet for Healthcare Providers:  IncredibleEmployment.be  This test is no t yet approved or cleared by the [place] FDA and  has been authorized for detection and/or diagnosis of SARS-CoV-2 by FDA under an Emergency Use Authorization (EUA). This EUA will remain  in effect (meaning this test can be used) for the duration of the COVID-19 declaration under Section 564(b)(1) of the Act, 21 U.S.C.section 360bbb-3(b)(1), unless the authorization is terminated  or revoked sooner.       Influenza Symia Herdt by PCR NEGATIVE NEGATIVE Final   Influenza B by PCR NEGATIVE NEGATIVE Final    Comment: (NOTE) The Xpert Xpress SARS-CoV-2/FLU/RSV plus assay is intended as an aid in the diagnosis of influenza from Nasopharyngeal swab specimens and should not be used as Jaysen Wey sole basis for treatment. Nasal washings and aspirates are unacceptable for Xpert Xpress SARS-CoV-2/FLU/RSV testing.  Fact Sheet for Patients: EntrepreneurPulse.com.au  Fact Sheet for Healthcare Providers: IncredibleEmployment.be  This test is not yet approved or cleared by the [place] FDA and has been authorized for detection and/or diagnosis of SARS-CoV-2 by FDA  under an Emergency Use Authorization (EUA). This EUA will remain in effect (meaning this test can be used) for the duration of the COVID-19 declaration under Section 564(b)(1) of the Act, 21 U.S.C. section 360bbb-3(b)(1), unless the authorization is terminated or revoked.  Performed at [facility], [place] [address], [place], [place] [address]          Radiology Studies: CT HEAD WO CONTRAST (5MM)  Result Date: 05/27/2021 CLINICAL DATA:  Encephalopathy EXAM: CT HEAD WITHOUT CONTRAST TECHNIQUE: Contiguous axial images were obtained from  the base of the skull through the vertex without intravenous contrast. COMPARISON:  None. FINDINGS: Brain: Vasogenic pattern edema in the right greater than left frontal lobes, likely crossing at the corpus callosum. This is concerning for Tatianna Ibbotson high-grade neoplasm. No acute hemorrhage or extra-axial collection. No hydrocephalus. Mild leftward midline shift. Vascular: No abnormal hyperdensity of the major intracranial arteries or dural venous sinuses. No intracranial atherosclerosis. Skull: The visualized skull base, calvarium and extracranial soft tissues are normal. Sinuses/Orbits: No fluid levels or advanced mucosal thickening of the visualized paranasal sinuses. No mastoid or middle ear effusion. The orbits are normal. IMPRESSION: 1. Vasogenic pattern edema in the right greater than left frontal lobes, likely crossing at the corpus callosum, concerning for Biagio Snelson high-grade neoplasm. MRI of the brain with and without contrast is recommended for further characterization. 2. No acute hemorrhage. Electronically Signed   By: Ulyses Jarred M.D.   On: 05/27/2021 23:25   CT Renal Stone Study  Result Date: 05/27/2021 CLINICAL DATA:  Renal failure. History of metastatic prostate cancer. EXAM: CT ABDOMEN AND PELVIS WITHOUT CONTRAST TECHNIQUE: Multidetector CT imaging of the abdomen and pelvis was performed following the standard protocol without IV contrast. COMPARISON:  CT abdomen and pelvis 08/28/2019. FINDINGS: Lower chest: No acute abnormality. Hepatobiliary: There is Sarahelizabeth Conway rounded 10 cm lesion in the dome of the liver. This has mildly increased in and is now mildly hyperdense when compared to the prior study. Additional innumerable cystic structures throughout the liver are again noted, some of which have mildly increased in size. There is moderate hepatomegaly, mildly increased from prior study. The liver is enlarged, unchanged. Gallbladder appears within normal limits. Pancreas: Unremarkable. No pancreatic ductal  dilatation or surrounding inflammatory changes. Spleen: Normal in size without focal abnormality. Adrenals/Urinary Tract: There is mild left and moderate right-sided hydroureteronephrosis to the level the pelvic inlet. No obstructing calculi are seen. No bladder calculi are seen. There is posterior right bladder wall thickening measuring up to 16 mm which is new. Stomach/Bowel: Stomach is within normal limits. Appendix appears normal. No evidence of bowel wall thickening, distention, or inflammatory changes. The appendix is not seen. Vascular/Lymphatic: Aorta is normal in size. There is new left retroperitoneal lymphadenopathy with the largest lymph node measuring 2.4 x 2.7 cm image 504/45. There is new bulky bilateral iliac chain lymphadenopathy. Discrete margins are difficult to evaluate secondary to lack of contrast and lack of intraperitoneal fat. Largest right soft tissue mass measures 4.2 x 4.1 cm. Largest left sided lymph node measures 3.6 x 2.5 cm. There are new bilateral enlarged inguinal lymph nodes measuring up to 2.1 x 3.3 cm. There also new bilateral enlarged axillary lymph nodes measuring up to 16 mm short axis. Reproductive: Prostate gland is heterogeneous enlarged. This has increased in size and now appears to invade the posterior right aspect of the bladder. Masslike density protruding from the prostate at the right bladder base measures 4.6 x 5.0 cm. Other: There is no  ascites or focal abdominal wall hernia. There is bilateral gynecomastia. Musculoskeletal: Vertebroplasty changes are again seen at T12. There are increasing mixed lucent and sclerotic lesions throughout the spine, sacrum and pelvis. No acute fractures are seen. IMPRESSION: 1. Prostate gland has enlarged in the interval and there is Shakemia Madera new masslike protrusion invading the right bladder base. There is right bladder wall thickening. Findings are worrisome for prostate cancer with direct extension. 2. There is moderate right-sided  hydronephrosis and mild left-sided hydronephrosis to the level of the pelvis. No obstructing calculus. 3. New bulky lymphadenopathy throughout the retroperitoneum, iliac chains and inguinal regions worrisome for metastatic disease or lymphoma. New enlarged bilateral axillary lymph nodes. 4. Moderate hepatomegaly has increased from the prior examination. Numerous hepatic cysts are again seen, some of which have increased in size. Largest lesion in the dome of the liver measures 10 cm and now appears mildly hyperdense. This may be due to interval hemorrhage within this cyst. 5. Mixed lucent and sclerotic lesions throughout the osseous structures compatible with metastatic disease. Electronically Signed   By: Ronney Asters M.D.   On: 05/27/2021 23:26        Scheduled Meds:  dexamethasone (DECADRON) injection  4 mg Intravenous Q12H   oxyCODONE  10 mg Oral Q12H   Continuous Infusions:  sodium bicarbonate 150 mEq in D5W infusion       LOS: 1 day    Time spent: over 30 min    Fayrene Helper, MD [facility]   To contact the attending provider between 7A-7P or the covering provider during after hours 7P-7A, please log into the web site [email or web address] and access using universal [place] password for that web site. If you do not have the password, please call the hospital operator.  05/29/2021, 8:13 PM

## 2021-05-29 NOTE — Progress Notes (Signed)
Patient unable to tolerate HOB elevated at 30 degrees due to back pain. Pt refused to get into chair for same reason. Stated if he had to sit he wears a brace. Pt medicated for pain.

## 2021-05-29 NOTE — Assessment & Plan Note (Signed)
mild

## 2021-05-29 NOTE — Assessment & Plan Note (Addendum)
In setting of brain mets CT head with vasogenic pattern edema in R greater than L frontal lobes, likely crossing at corpus callosum concerning for high grade neoplasm MRI pending (GFR 27 in October, will go ahead and pursue without contrast) Dr. Sondra Come to evaluate Continue steroids Continue pain meds at reduced (~half dose) Relatively stable today

## 2021-05-29 NOTE — Consult Note (Signed)
Reason for the request:    Prostate cancer  HPI: I was asked by Dr. Florene Glen to evaluate Jonathan Campbell for evaluation of advanced prostate cancer.  He is a 60 year old man with castration-resistant prostate cancer diagnosed in 2019.  He has progressed on hormonal therapy as well as androgen receptor pathway inhibitors as well as Xofigo.  He has received palliative radiation therapy on multiple occasions.  He was hospitalized due to altered mental status and failure to thrive.  Imaging studies showed vasogenic edema in the left frontal lobe concerning for metastatic disease to the brain.  CT scan of the abdomen and pelvis showed hydronephrosis as well.  He was started on dexamethasone 4 mg every 12 hours and receiving supportive care.  His creatinine remains elevated currently at 3.18 with with hemoglobin remains low around 6.  Clinically, he is quite debilitated but remains awake and oriented.  He does report word finding difficulties and confusion.  He denies any worsening bone pain.  He does not report any headaches, blurry vision, syncope or seizures. Does not report any fevers, chills or sweats.  Does not report any cough, wheezing or hemoptysis.  Does not report any chest pain, palpitation, orthopnea or leg edema.  Does not report any nausea, vomiting or abdominal pain.  Does not report any constipation or diarrhea.  Does not report any skeletal complaints.    Does not report frequency, urgency or hematuria.  Does not report any skin rashes or lesions. Does not report any heat or cold intolerance.  Does not report any lymphadenopathy or petechiae.  Does not report any anxiety or depression.  Remaining review of systems is negative.     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[facility])   :   Past Surgical History:  Procedure Laterality Date   IR BONE TUMOR(S)RF ABLATION  11/22/2017   IR KYPHO THORACIC WITH BONE BIOPSY   11/22/2017   IR RADIOLOGIST EVAL & MGMT  11/09/2017   NO PAST SURGERIES    :   Current Facility-Administered Medications:    acetaminophen (TYLENOL) tablet 650 mg, 650 mg, Oral, Q6H PRN, 650 mg at 05/28/21 2128 **OR** acetaminophen (TYLENOL) suppository 650 mg, 650 mg, Rectal, Q6H PRN, Reubin Milan, MD   dexamethasone (DECADRON) injection 4 mg, 4 mg, Intravenous, Q12H, Reubin Milan, MD, 4 mg at 05/28/21 2128   lactated ringers infusion, , Intravenous, Continuous, Reubin Milan, MD, Last Rate: 75 mL/hr at 05/29/21 0608, New Bag at 05/29/21 0608   ondansetron (ZOFRAN) tablet 4 mg, 4 mg, Oral, Q6H PRN **OR** ondansetron (ZOFRAN) injection 4 mg, 4 mg, Intravenous, Q6H PRN, Reubin Milan, MD:  No Known Allergies:  History reviewed. No pertinent family history.:   Social History   Socioeconomic History   Marital status: Single    Spouse name: Not on file   Number of children: Not on file   Years of education: Not on file   Highest education level: Not on file  Occupational History   Not on file  Tobacco Use   Smoking status: Light Smoker    Types: Cigars   Smokeless tobacco: Never  Vaping Use   Vaping Use: Never used  Substance and Sexual Activity   Alcohol use: Yes    Comment: occ   Drug use: Yes    Types: Marijuana    Comment: occ   Sexual activity: Not Currently  Other Topics  Concern   Not on file  Social History Narrative   Not on file   Social Determinants of Health   Financial Resource Strain: Not on file  Food Insecurity: Not on file  Transportation Needs: Not on file  Physical Activity: Not on file  Stress: Not on file  Social Connections: Not on file  Intimate Partner Violence: Not on file  :  Pertinent items are noted in HPI.  Exam: Blood pressure (!) 96/56, pulse 71, temperature 98 F (36.7 C), temperature source Oral, resp. rate 16, height 5\' 9"  (1.753 m), weight 139 lb 8.8 oz (63.3 kg), SpO2 100 %. General appearance:  Chronically ill-appearing without distress. Head: atraumatic without any abnormalities. Eyes: conjunctivae/corneas clear. PERRL.  Sclera anicteric. Throat: lips, mucosa, and tongue normal; without oral thrush or ulcers. Resp: clear to auscultation bilaterally without rhonchi, wheezes or dullness to percussion. Cardio: regular rate and rhythm, S1, S2 normal, no murmur, click, rub or gallop GI: soft, non-tender; bowel sounds normal; no masses,  no organomegaly Skin: Skin color, texture, turgor normal. No rashes or lesions Lymph nodes: Cervical, supraclavicular, and axillary nodes normal. Neurologic: Grossly normal without any motor, sensory or deep tendon reflexes. Musculoskeletal: No joint deformity or effusion.   Recent Labs    05/27/21 2112  WBC 6.0  HGB 6.1*  HCT 18.6*  PLT 42*    Recent Labs    05/27/21 2112  NA 131*  K 5.0  CL 103  CO2 12*  GLUCOSE 130*  BUN 83*  CREATININE 3.18*  CALCIUM 9.7       CT HEAD WO CONTRAST (5MM)  Result Date: 05/27/2021 CLINICAL DATA:  Encephalopathy EXAM: CT HEAD WITHOUT CONTRAST TECHNIQUE: Contiguous axial images were obtained from the base of the skull through the vertex without intravenous contrast. COMPARISON:  None. FINDINGS: Brain: Vasogenic pattern edema in the right greater than left frontal lobes, likely crossing at the corpus callosum. This is concerning for a high-grade neoplasm. No acute hemorrhage or extra-axial collection. No hydrocephalus. Mild leftward midline shift. Vascular: No abnormal hyperdensity of the major intracranial arteries or dural venous sinuses. No intracranial atherosclerosis. Skull: The visualized skull base, calvarium and extracranial soft tissues are normal. Sinuses/Orbits: No fluid levels or advanced mucosal thickening of the visualized paranasal sinuses. No mastoid or middle ear effusion. The orbits are normal. IMPRESSION: 1. Vasogenic pattern edema in the right greater than left frontal lobes, likely  crossing at the corpus callosum, concerning for a high-grade neoplasm. MRI of the brain with and without contrast is recommended for further characterization. 2. No acute hemorrhage. Electronically Signed   By: Ulyses Jarred M.D.   On: 05/27/2021 23:25   CT Renal Stone Study  Result Date: 05/27/2021 CLINICAL DATA:  Renal failure. History of metastatic prostate cancer. EXAM: CT ABDOMEN AND PELVIS WITHOUT CONTRAST TECHNIQUE: Multidetector CT imaging of the abdomen and pelvis was performed following the standard protocol without IV contrast. COMPARISON:  CT abdomen and pelvis 08/28/2019. FINDINGS: Lower chest: No acute abnormality. Hepatobiliary: There is a rounded 10 cm lesion in the dome of the liver. This has mildly increased in and is now mildly hyperdense when compared to the prior study. Additional innumerable cystic structures throughout the liver are again noted, some of which have mildly increased in size. There is moderate hepatomegaly, mildly increased from prior study. The liver is enlarged, unchanged. Gallbladder appears within normal limits. Pancreas: Unremarkable. No pancreatic ductal dilatation or surrounding inflammatory changes. Spleen: Normal in size without focal abnormality. Adrenals/Urinary Tract: There  is mild left and moderate right-sided hydroureteronephrosis to the level the pelvic inlet. No obstructing calculi are seen. No bladder calculi are seen. There is posterior right bladder wall thickening measuring up to 16 mm which is new. Stomach/Bowel: Stomach is within normal limits. Appendix appears normal. No evidence of bowel wall thickening, distention, or inflammatory changes. The appendix is not seen. Vascular/Lymphatic: Aorta is normal in size. There is new left retroperitoneal lymphadenopathy with the largest lymph node measuring 2.4 x 2.7 cm image 504/45. There is new bulky bilateral iliac chain lymphadenopathy. Discrete margins are difficult to evaluate secondary to lack of contrast  and lack of intraperitoneal fat. Largest right soft tissue mass measures 4.2 x 4.1 cm. Largest left sided lymph node measures 3.6 x 2.5 cm. There are new bilateral enlarged inguinal lymph nodes measuring up to 2.1 x 3.3 cm. There also new bilateral enlarged axillary lymph nodes measuring up to 16 mm short axis. Reproductive: Prostate gland is heterogeneous enlarged. This has increased in size and now appears to invade the posterior right aspect of the bladder. Masslike density protruding from the prostate at the right bladder base measures 4.6 x 5.0 cm. Other: There is no ascites or focal abdominal wall hernia. There is bilateral gynecomastia. Musculoskeletal: Vertebroplasty changes are again seen at T12. There are increasing mixed lucent and sclerotic lesions throughout the spine, sacrum and pelvis. No acute fractures are seen. IMPRESSION: 1. Prostate gland has enlarged in the interval and there is a new masslike protrusion invading the right bladder base. There is right bladder wall thickening. Findings are worrisome for prostate cancer with direct extension. 2. There is moderate right-sided hydronephrosis and mild left-sided hydronephrosis to the level of the pelvis. No obstructing calculus. 3. New bulky lymphadenopathy throughout the retroperitoneum, iliac chains and inguinal regions worrisome for metastatic disease or lymphoma. New enlarged bilateral axillary lymph nodes. 4. Moderate hepatomegaly has increased from the prior examination. Numerous hepatic cysts are again seen, some of which have increased in size. Largest lesion in the dome of the liver measures 10 cm and now appears mildly hyperdense. This may be due to interval hemorrhage within this cyst. 5. Mixed lucent and sclerotic lesions throughout the osseous structures compatible with metastatic disease. Electronically Signed   By: Ronney Asters M.D.   On: 05/27/2021 23:26    Assessment and Plan:   60 year old with:  1.  Advanced prostate cancer  with metastatic disease to the bone and lymphadenopathy and possibly CNS metastasis.  He has progressed on multiple therapies and currently not a candidate for any additional chemotherapy given his debilitated status as well as pancytopenia.  These findings were discussed today with the patient and his friend accompanied him today.  His disease is incurable and likely approaching end-stage.  I recommended switching him to a more of a palliative care approach and supportive management only.  I recommended palliative medicine involvement in his care.  No additional anticancer treatment is recommended.  2.  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  I will ask Dr. Randa Ngo to evaluate for possible whole brain radiation once MRI is completed.  In the meantime continue with dexamethasone and supportive management.  3.  Anemia: It is reasonable to consider transfusion of 1 unit of packed red cells to keep his hemoglobin above 7.  4.  Acute renal failure: Likely related to bilateral hydronephrosis.  He might not be a candidate for surgical intervention if he opted to proceed with more palliative  care approach moving forward.  5.  Prognosis and goals of care: His prognosis is poor with limited life expectancy.  I am in favor of continuing measures to palliate his symptoms including potentially radiation to the brain, steroids and transfusion.  We will appreciate input from palliative medicine services.   80  minutes were dedicated to this visit. The time was spent on reviewing laboratory data, imaging studies, discussing treatment options, and answering questions regarding future plan.

## 2021-05-29 NOTE — Assessment & Plan Note (Addendum)
Appreciate Dr. Hazeline Junker assistance Currently not candidate for additional chemo, Dr. Alen Blew recommending more of [place] palliative approach with supportive care, no additional anticancer treatment  CT with findings concerning for prostate cancer with direct dextension, new bulky LAD within retroperitoneum, iliac chains, and inguinal regions concerning for metastatic disease or lymphoma, moderate hepatomegaly, lucent and sclerotic lesions throughout osseus structures

## 2021-05-29 NOTE — Assessment & Plan Note (Addendum)
In setting of moderate right sided hydro and mild L sided hydro Improved with blood/volume  Baseline creatinine 1 in August.  In 03/2021 it was 2.61. At admission, 3.18 Slight worsening today to 2.7 He complained of more abdominal discomfort this morning, concerning in setting of hydro this maybe related (by time I spoke to [place], sig other, she said pain was better).  Will ask urology to help [place] with decisions regarding need for nephrostomy tube - if we think it may be helpful for symptom management/pain relief, would be in favor, but seems like things maybe improved now, will follow. Bladder scan

## 2021-05-29 NOTE — Assessment & Plan Note (Addendum)
Concerning for metastatic disease As above, Dr. Sondra Come to eval, consider whole brain radiation MRI pending, steroids

## 2021-05-29 NOTE — Assessment & Plan Note (Signed)
In setting of malignancy No si/sx bleeding, follow

## 2021-05-30 LAB — CBC WITH DIFFERENTIAL/PLATELET
Abs Immature Granulocytes: 0 10*3/uL (ref 0.00–0.07)
Basophils Absolute: 0 10*3/uL (ref 0.0–0.1)
Basophils Relative: 0 %
Eosinophils Absolute: 0 10*3/uL (ref 0.0–0.5)
Eosinophils Relative: 1 %
HCT: 24.2 % — ABNORMAL LOW (ref 39.0–52.0)
Hemoglobin: 8.1 g/dL — ABNORMAL LOW (ref 13.0–17.0)
Lymphocytes Relative: 10 %
Lymphs Abs: 0.3 10*3/uL — ABNORMAL LOW (ref 0.7–4.0)
MCH: 30.2 pg (ref 26.0–34.0)
MCHC: 33.5 g/dL (ref 30.0–36.0)
MCV: 90.3 fL (ref 80.0–100.0)
Monocytes Absolute: 0.3 10*3/uL (ref 0.1–1.0)
Monocytes Relative: 8 %
Myelocytes: 1 %
Neutro Abs: 2.7 10*3/uL (ref 1.7–7.7)
Neutrophils Relative %: 80 %
Platelets: 24 10*3/uL — CL (ref 150–400)
RBC: 2.68 MIL/uL — ABNORMAL LOW (ref 4.22–5.81)
RDW: 17.8 % — ABNORMAL HIGH (ref 11.5–15.5)
WBC: 3.4 10*3/uL — ABNORMAL LOW (ref 4.0–10.5)
nRBC: 15.8 % — ABNORMAL HIGH (ref 0.0–0.2)
nRBC: 18 /100 WBC — ABNORMAL HIGH

## 2021-05-30 LAB — COMPREHENSIVE METABOLIC PANEL
ALT: 14 U/L (ref 0–44)
AST: 42 U/L — ABNORMAL HIGH (ref 15–41)
Albumin: 3.1 g/dL — ABNORMAL LOW (ref 3.5–5.0)
Alkaline Phosphatase: 151 U/L — ABNORMAL HIGH (ref 38–126)
Anion gap: 10 (ref 5–15)
BUN: 77 mg/dL — ABNORMAL HIGH (ref 6–20)
CO2: 16 mmol/L — ABNORMAL LOW (ref 22–32)
Calcium: 8.9 mg/dL (ref 8.9–10.3)
Chloride: 107 mmol/L (ref 98–111)
Creatinine, Ser: 2.7 mg/dL — ABNORMAL HIGH (ref 0.61–1.24)
GFR, Estimated: 26 mL/min — ABNORMAL LOW (ref >60)
Glucose, Bld: 111 mg/dL — ABNORMAL HIGH (ref 70–99)
Potassium: 4.4 mmol/L (ref 3.5–5.1)
Sodium: 133 mmol/L — ABNORMAL LOW (ref 135–145)
Total Bilirubin: 0.9 mg/dL (ref 0.3–1.2)
Total Protein: 6.9 g/dL (ref 6.5–8.1)

## 2021-05-30 LAB — CBC
HCT: 26.3 % — ABNORMAL LOW (ref 39.0–52.0)
Hemoglobin: 8.7 g/dL — ABNORMAL LOW (ref 13.0–17.0)
MCH: 30.6 pg (ref 26.0–34.0)
MCHC: 33.1 g/dL (ref 30.0–36.0)
MCV: 92.6 fL (ref 80.0–100.0)
Platelets: 26 10*3/uL — CL (ref 150–400)
RBC: 2.84 MIL/uL — ABNORMAL LOW (ref 4.22–5.81)
RDW: 17.8 % — ABNORMAL HIGH (ref 11.5–15.5)
WBC: 4.1 10*3/uL (ref 4.0–10.5)
nRBC: 13.8 % — ABNORMAL HIGH (ref 0.0–0.2)

## 2021-05-30 MED ORDER — ORAL CARE MOUTH RINSE
15.0000 mL | Freq: Two times a day (BID) | OROMUCOSAL | Status: DC
  Administered 2021-05-30 – 2021-06-01 (×6): 15 mL via OROMUCOSAL

## 2021-05-30 NOTE — Progress Notes (Signed)
PROGRESS NOTE    Jonathan Campbell  TKP:[id number] DOB: 05-08-1961 DOA: 05/27/2021 PCP: Sonia Side., FNP   No chief complaint on file.   Brief Narrative:  60 yo with hx prostate cancer metastatic to bone, T12 compression fracture, anemia of chronic disease who presented to the hospital for increased confusion.  He was admitted with AKI, anemia, and brian imaging concerning for metastatic disease.  Planning for palliative radiation with rad onc, likely Monday.  Palliative care and oncology following.  Urology c/s today with hydro.  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 Sondra Come to evaluate Continue steroids Continue pain meds at reduced (~half dose) Relatively stable today  AKI (acute kidney injury) ([place]) In setting of moderate right sided hydro and mild L sided hydro Improved with blood/volume  Baseline creatinine 1 in August.  In 03/2021 it was 2.61. At admission, 3.18 Slight worsening today to 2.7 He complained of more abdominal discomfort this morning, concerning in setting of hydro this maybe related (by time I spoke to [place], sig other, she said pain was better).  Will ask urology to help [place] with decisions regarding need for nephrostomy tube - if we think it may be helpful for symptom  management/pain relief, would be in favor, but seems like things maybe improved now, will follow. 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 Hazeline Junker assistance Currently not candidate for additional chemo, Dr. Alen Blew recommending more of Barnard Sharps palliative approach with supportive care, no additional anticancer treatment  CT with findings concerning for prostate cancer with direct dextension, new bulky LAD within retroperitoneum, iliac chains, and inguinal regions concerning for metastatic disease or lymphoma, moderate hepatomegaly, lucent and sclerotic lesions throughout osseus structures  Brain lesion Concerning for metastatic disease As above, Dr. Sondra Come to eval, consider whole brain radiation MRI pending, steroids  Hyponatremia mild  Metabolic acidosis NAGMA 2/2 renal failure, start bicarb  DVT prophylaxis: SCD Code Status: full Family Communication: sig other, Mary over phone Disposition:   Status is: Inpatient  Remains inpatient appropriate because: need for IV steroids, onc eval       Consultants:  Rad onc Oncology Palliative care  Procedures:  none  Antimicrobials:  Anti-infectives (From admission, onward)    None          Subjective: C/o abdominal discomfort  Objective: Vitals:   05/29/21 1255 05/29/21 2033 05/30/21 0508 05/30/21 0641  BP: 105/62 111/82 104/62 99/60  Pulse: 69 93 81 84  Resp: 17 16 14 16   Temp: (!) 97.5 F (36.4 C) 98.7  F (37.1 C) 98.5 F (36.9 C) 98 F (36.7 C)  TempSrc: Oral Oral Oral Oral  SpO2: 100% 100% 100% 100%  Weight:      Height:        Intake/Output Summary (Last 24 hours) at 05/30/2021 1515 Last data filed at 05/30/2021 1500 Gross per  24 hour  Intake 1425.77 ml  Output 1000 ml  Net 425.77 ml   Filed Weights   05/27/21 2003 05/29/21 0539  Weight: 77.1 kg 63.3 kg    Examination:  General: No acute distress. Cardiovascular: RRR Lungs: unlabored Abdomen: mild abdominal discomfort today Neurological: moving all extremities, pleasantly confused Skin: Warm and dry. No rashes or lesions. Extremities: No clubbing or cyanosis. No edema.   Data Reviewed: I have personally reviewed following labs and imaging studies  CBC: Recent Labs  Lab 05/27/21 2112 05/29/21 0936 05/30/21 0755  WBC 6.0 3.7* 3.4*  NEUTROABS 4.8  --  2.7  HGB 6.1* 9.2* 8.1*  HCT 18.6* 27.3* 24.2*  MCV 94.9 90.7 90.3  PLT 42* 27* 24*    Basic Metabolic Panel: Recent Labs  Lab 05/27/21 2112 05/29/21 0936 05/30/21 0755  NA 131* 132* 133*  K 5.0 4.4 4.4  CL 103 106 107  CO2 12* 13* 16*  GLUCOSE 130* 131* 111*  BUN 83* 73* 77*  CREATININE 3.18* 2.50* 2.70*  CALCIUM 9.7 9.1 8.9    GFR: Estimated Creatinine Clearance: 26 mL/min (Jonathan Campbell) (by C-G formula based on SCr of 2.7 mg/dL (H)).  Liver Function Tests: Recent Labs  Lab 05/27/21 2112 05/29/21 0936 05/30/21 0755  AST 49* 47* 42*  ALT 15 14 14   ALKPHOS 180* 168* 151*  BILITOT 1.7* 1.3* 0.9  PROT 8.0 7.0 6.9  ALBUMIN 3.4* 3.2* 3.1*    CBG: No results for input(s): GLUCAP in the last 168 hours.   Recent Results (from the past 240 hour(s))  Resp Panel by RT-PCR (Flu Jonathan Campbell&B, Covid) Nasopharyngeal Swab     Status: None   Collection Time: 05/28/21  3:16 PM   Specimen: Nasopharyngeal Swab; Nasopharyngeal(NP) swabs in vial transport medium  Result Value Ref Range Status   SARS Coronavirus 2 by RT PCR NEGATIVE NEGATIVE Final    Comment: (NOTE) SARS-CoV-2 target nucleic acids are NOT DETECTED.  The SARS-CoV-2 RNA is generally detectable in upper respiratory specimens during the acute phase of infection. The lowest concentration of SARS-CoV-2 viral copies this assay can detect  is 138 copies/mL. Jonathan Campbell negative result does not preclude SARS-Cov-2 infection and should not be used as the sole basis for treatment or other patient management decisions. Jonathan Campbell negative result may occur with  improper specimen collection/handling, submission of specimen other than nasopharyngeal swab, presence of viral mutation(s) within the areas targeted by this assay, and inadequate number of viral copies(<138 copies/mL). Jonathan Campbell negative result must be combined with clinical observations, patient history, and epidemiological information. The expected result is Negative.  Fact Sheet for Patients:  EntrepreneurPulse.com.au  Fact Sheet for Healthcare Providers:  IncredibleEmployment.be  This test is no t yet approved or cleared by the [place] FDA and  has been authorized for detection and/or diagnosis of SARS-CoV-2 by FDA under an Emergency Use Authorization (EUA). This EUA will remain  in effect (meaning this test can be used) for the duration of the COVID-19 declaration under Section 564(b)(1) of the Act, 21 U.S.C.section 360bbb-3(b)(1), unless the authorization is terminated  or revoked sooner.       Influenza Jonathan Campbell by PCR NEGATIVE NEGATIVE Final  Influenza B by PCR NEGATIVE NEGATIVE Final    Comment: (NOTE) The Xpert Xpress SARS-CoV-2/FLU/RSV plus assay is intended as an aid in the diagnosis of influenza from Nasopharyngeal swab specimens and should not be used as Jonathan Campbell sole basis for treatment. Nasal washings and aspirates are unacceptable for Xpert Xpress SARS-CoV-2/FLU/RSV testing.  Fact Sheet for Patients: EntrepreneurPulse.com.au  Fact Sheet for Healthcare Providers: IncredibleEmployment.be  This test is not yet approved or cleared by the [place] FDA and has been authorized for detection and/or diagnosis of SARS-CoV-2 by FDA under an Emergency Use Authorization (EUA). This EUA will remain in effect  (meaning this test can be used) for the duration of the COVID-19 declaration under Section 564(b)(1) of the Act, 21 U.S.C. section 360bbb-3(b)(1), unless the authorization is terminated or revoked.  Performed at [facility], [place] [address] [place], [place] [address]          Radiology Studies: No results found.      Scheduled Meds:  dexamethasone (DECADRON) injection  4 mg Intravenous Q12H   mouth rinse  15 mL Mouth Rinse BID   oxyCODONE  10 mg Oral Q12H   Continuous Infusions:  sodium bicarbonate 150 mEq in D5W infusion 75 mL/hr at 05/30/21 1152     LOS: 2 days    Time spent: over 30 min    Fayrene Helper, MD [facility]   To contact the attending provider between 7A-7P or the covering provider during after hours 7P-7A, please log into the web site [email or web address] and access using universal [place] password for that web site. If you do not have the password, please call the hospital operator.  05/30/2021, 3:15 PM

## 2021-05-30 NOTE — Consult Note (Signed)
Urology Consult   Physician requesting consult: [place]   Reason for consult: Hydronephrosis and abdominal pain in setting of metastatic prostate cancer   History of Present Illness: Jonathan Campbell is a 60 y.o. with advanced castrate resistant metastatic prostate cancer who is admitted due to worsening mental status and failure to thrive.  He has had several weeks of poor PO intake, decreased mobility and overall failure to thrive at home. He presented to the ED 11/30 due to altered mental status. Initial work up notable for pancytopenia, creatinine 2.7, and CT imaging with diffuse metastatic disease with tumor invasion into bladder, significant lymphadenopathy throughout retroperitoneum, numerous liver lesions, bones lesions, and new brain lesions. Imaging also shows right moderate hydronephrosis and mild left hydronephrosis. Patient is complaining of abdominal pain. Urology is consulted regarding utility of decompression given hydronephrosis and abdominal pain.  Patient is confused so history is obtained from his significant other at beside.   Patient currently voiding into condom catheter. States he is not having difficulty voiding. No hematuria.    Past Medical History:  Diagnosis Date   Compression fracture of T12 vertebra ([place])    Prostate cancer metastatic to bone [facility])     Past Surgical History:  Procedure Laterality Date   IR BONE TUMOR(S)RF ABLATION  11/22/2017   IR KYPHO THORACIC WITH BONE BIOPSY  11/22/2017   IR RADIOLOGIST EVAL & MGMT  11/09/2017   NO PAST SURGERIES      Current Hospital Medications:  Home Meds:  No current facility-administered medications on file prior to encounter.   Current Outpatient Medications on File Prior to Encounter  Medication Sig Dispense Refill   acetaminophen (TYLENOL) 500 MG tablet Take 500 mg by mouth daily as needed.     enzalutamide (XTANDI) 80 MG tablet TAKE 2 TABLETS BY MOUTH DAILY. 60 tablet 0   oxyCODONE ER (XTAMPZA ER) 18  MG C12A Take 18 mg by mouth in the morning and at bedtime. 12 hours apart 60 capsule 0   Oxycodone HCl 10 MG TABS Take 2 tablets (20 mg total) by mouth 6 (six) times daily. PRN Pain. 360 tablet 0   polyethylene glycol (MIRALAX / GLYCOLAX) packet Take 17 g by mouth daily. Use OTC Clearax 17 g po daily (Patient taking differently: Take 17 g by mouth daily as needed for mild constipation.) 30 each 2     Scheduled Meds:  dexamethasone (DECADRON) injection  4 mg Intravenous Q12H   mouth rinse  15 mL Mouth Rinse BID   oxyCODONE  10 mg Oral Q12H   Continuous Infusions:  sodium bicarbonate 150 mEq in D5W infusion 75 mL/hr at 05/30/21 1152   PRN Meds:.acetaminophen **OR** acetaminophen, ondansetron **OR** ondansetron (ZOFRAN) IV, oxyCODONE  Allergies: No Known Allergies  History reviewed. No pertinent family history.  Social History:  reports that he has been smoking cigars. He has never used smokeless tobacco. He reports current alcohol use. He reports current drug use. Drug: Marijuana.  ROS: A complete review of systems was performed.  All systems are negative except for pertinent findings as noted.  Physical Exam:  Vital signs in last 24 hours: Temp:  [98 F (36.7 C)-98.7 F (37.1 C)] 98 F (36.7 C) (12/03 0641) Pulse Rate:  [81-93] 84 (12/03 0641) Resp:  [14-16] 16 (12/03 0641) BP: (99-111)/(60-82) 99/60 (12/03 0641) SpO2:  [100 %] 100 % (12/03 0641) Constitutional:  Alert and oriented x 1 , cachectic and chronically ill appearing  Cardiovascular: Regular rate and rhythm, No  JVD Respiratory: Normal respiratory effort, Lungs clear bilaterally GI: Abdomen is soft and mildly distended, diffusely tender to palpation  GU: No CVA tenderness, condom cath in place with clear yellow urine  Psychiatric: Blunted affect   Laboratory Data:  Recent Labs    05/27/21 2112 05/29/21 0936 05/30/21 0755  WBC 6.0 3.7* 3.4*  HGB 6.1* 9.2* 8.1*  HCT 18.6* 27.3* 24.2*  PLT 42* 27* 24*     Recent Labs    05/27/21 2112 05/29/21 0936 05/30/21 0755  NA 131* 132* 133*  K 5.0 4.4 4.4  CL 103 106 107  GLUCOSE 130* 131* 111*  BUN 83* 73* 77*  CALCIUM 9.7 9.1 8.9  CREATININE 3.18* 2.50* 2.70*     Results for orders placed or performed during the hospital encounter of 05/27/21 (from the past 24 hour(s))  CBC with Differential/Platelet     Status: Abnormal   Collection Time: 05/30/21  7:55 AM  Result Value Ref Range   WBC 3.4 (L) 4.0 - 10.5 K/uL   RBC 2.68 (L) 4.22 - 5.81 MIL/uL   Hemoglobin 8.1 (L) 13.0 - 17.0 g/dL   HCT 24.2 (L) 39.0 - 52.0 %   MCV 90.3 80.0 - 100.0 fL   MCH 30.2 26.0 - 34.0 pg   MCHC 33.5 30.0 - 36.0 g/dL   RDW 17.8 (H) 11.5 - 15.5 %   Platelets 24 (LL) 150 - 400 K/uL   nRBC 15.8 (H) 0.0 - 0.2 %   Neutrophils Relative % 80 %   Neutro Abs 2.7 1.7 - 7.7 K/uL   Lymphocytes Relative 10 %   Lymphs Abs 0.3 (L) 0.7 - 4.0 K/uL   Monocytes Relative 8 %   Monocytes Absolute 0.3 0.1 - 1.0 K/uL   Eosinophils Relative 1 %   Eosinophils Absolute 0.0 0.0 - 0.5 K/uL   Basophils Relative 0 %   Basophils Absolute 0.0 0.0 - 0.1 K/uL   nRBC 18 (H) 0 /100 WBC   Myelocytes 1 %   Abs Immature Granulocytes 0.00 0.00 - 0.07 K/uL   Tear Drop Cells PRESENT    Polychromasia PRESENT   Comprehensive metabolic panel     Status: Abnormal   Collection Time: 05/30/21  7:55 AM  Result Value Ref Range   Sodium 133 (L) 135 - 145 mmol/L   Potassium 4.4 3.5 - 5.1 mmol/L   Chloride 107 98 - 111 mmol/L   CO2 16 (L) 22 - 32 mmol/L   Glucose, Bld 111 (H) 70 - 99 mg/dL   BUN 77 (H) 6 - 20 mg/dL   Creatinine, Ser 2.70 (H) 0.61 - 1.24 mg/dL   Calcium 8.9 8.9 - 10.3 mg/dL   Total Protein 6.9 6.5 - 8.1 g/dL   Albumin 3.1 (L) 3.5 - 5.0 g/dL   AST 42 (H) 15 - 41 U/L   ALT 14 0 - 44 U/L   Alkaline Phosphatase 151 (H) 38 - 126 U/L   Total Bilirubin 0.9 0.3 - 1.2 mg/dL   GFR, Estimated 26 (L) >60 mL/min   Anion gap 10 5 - 15   Recent Results (from the past 240 hour(s))   Resp Panel by RT-PCR (Flu A&B, Covid) Nasopharyngeal Swab     Status: None   Collection Time: 05/28/21  3:16 PM   Specimen: Nasopharyngeal Swab; Nasopharyngeal(NP) swabs in vial transport medium  Result Value Ref Range Status   SARS Coronavirus 2 by RT PCR NEGATIVE NEGATIVE Final    Comment: (NOTE) SARS-CoV-2 target nucleic acids  are NOT DETECTED.  The SARS-CoV-2 RNA is generally detectable in upper respiratory specimens during the acute phase of infection. The lowest concentration of SARS-CoV-2 viral copies this assay can detect is 138 copies/mL. A negative result does not preclude SARS-Cov-2 infection and should not be used as the sole basis for treatment or other patient management decisions. A negative result may occur with  improper specimen collection/handling, submission of specimen other than nasopharyngeal swab, presence of viral mutation(s) within the areas targeted by this assay, and inadequate number of viral copies(<138 copies/mL). A negative result must be combined with clinical observations, patient history, and epidemiological information. The expected result is Negative.  Fact Sheet for Patients:  EntrepreneurPulse.com.au  Fact Sheet for Healthcare Providers:  IncredibleEmployment.be  This test is no t yet approved or cleared by the [place] FDA and  has been authorized for detection and/or diagnosis of SARS-CoV-2 by FDA under an Emergency Use Authorization (EUA). This EUA will remain  in effect (meaning this test can be used) for the duration of the COVID-19 declaration under Section 564(b)(1) of the Act, 21 U.S.C.section 360bbb-3(b)(1), unless the authorization is terminated  or revoked sooner.       Influenza A by PCR NEGATIVE NEGATIVE Final   Influenza B by PCR NEGATIVE NEGATIVE Final    Comment: (NOTE) The Xpert Xpress SARS-CoV-2/FLU/RSV plus assay is intended as an aid in the diagnosis of influenza from  Nasopharyngeal swab specimens and should not be used as a sole basis for treatment. Nasal washings and aspirates are unacceptable for Xpert Xpress SARS-CoV-2/FLU/RSV testing.  Fact Sheet for Patients: EntrepreneurPulse.com.au  Fact Sheet for Healthcare Providers: IncredibleEmployment.be  This test is not yet approved or cleared by the [place] FDA and has been authorized for detection and/or diagnosis of SARS-CoV-2 by FDA under an Emergency Use Authorization (EUA). This EUA will remain in effect (meaning this test can be used) for the duration of the COVID-19 declaration under Section 564(b)(1) of the Act, 21 U.S.C. section 360bbb-3(b)(1), unless the authorization is terminated or revoked.  Performed at [facility], [place] [address], [place], [place] [address]     Renal Function: Recent Labs    05/27/21 2112 05/29/21 0936 05/30/21 0755  CREATININE 3.18* 2.50* 2.70*   Estimated Creatinine Clearance: 26 mL/min (A) (by C-G formula based on SCr of 2.7 mg/dL (H)).  Radiologic Imaging: No results found.  I independently reviewed the above imaging studies.  Impression/Recommendation 60 y/o male with advanced diffusely metastatic prostate cancer with R>L hydronephrosis and abdominal pain.   Patient's hydronephrosis is due to extensive intra-abdominal metastatic disease and local invasion into the bladder which is worst on the right. Indications for decompression would be infection, kidney injury, and intractable pain. Given the appearance of his bladder on imaging and extent of disease, stenting is not a feasible option leaving nephrostomy tubes as the remaining intervention.  He does not have any signs of systemic infection and his creatinine appears chronically elevated. Though he is complaining of abdominal pain which can be severe at times, this pain is more likely due to his intra-abdominal tumor burden than his  hydronephrosis. His platelets are <30 which puts him at significant risk of bleeding with nephrostomy tube placement. This risk seems to outweigh the potential benefit of decompression with nephrostomy tubes at this time. Additionally, patient and his significant other would like to think about whether a procedure of this nature are in line with his goals of care given his advanced disease.  Aldine Contes 05/30/2021, 3:31 PM

## 2021-05-30 NOTE — Plan of Care (Signed)

## 2021-05-31 ENCOUNTER — Inpatient Hospital Stay (HOSPITAL_COMMUNITY): Payer: Medicare Other

## 2021-05-31 DIAGNOSIS — E871 Hypo-osmolality and hyponatremia: Secondary | ICD-10-CM

## 2021-05-31 DIAGNOSIS — N1330 Unspecified hydronephrosis: Secondary | ICD-10-CM

## 2021-05-31 DIAGNOSIS — C61 Malignant neoplasm of prostate: Secondary | ICD-10-CM

## 2021-05-31 DIAGNOSIS — C7951 Secondary malignant neoplasm of bone: Secondary | ICD-10-CM

## 2021-05-31 DIAGNOSIS — D649 Anemia, unspecified: Secondary | ICD-10-CM

## 2021-05-31 LAB — CBC WITH DIFFERENTIAL/PLATELET
Abs Immature Granulocytes: 0.13 10*3/uL — ABNORMAL HIGH (ref 0.00–0.07)
Basophils Absolute: 0 10*3/uL (ref 0.0–0.1)
Basophils Relative: 1 %
Eosinophils Absolute: 0 10*3/uL (ref 0.0–0.5)
Eosinophils Relative: 1 %
HCT: 24.3 % — ABNORMAL LOW (ref 39.0–52.0)
Hemoglobin: 8.2 g/dL — ABNORMAL LOW (ref 13.0–17.0)
Immature Granulocytes: 4 %
Lymphocytes Relative: 12 %
Lymphs Abs: 0.4 10*3/uL — ABNORMAL LOW (ref 0.7–4.0)
MCH: 30.9 pg (ref 26.0–34.0)
MCHC: 33.7 g/dL (ref 30.0–36.0)
MCV: 91.7 fL (ref 80.0–100.0)
Monocytes Absolute: 0.4 10*3/uL (ref 0.1–1.0)
Monocytes Relative: 11 %
Neutro Abs: 2.7 10*3/uL (ref 1.7–7.7)
Neutrophils Relative %: 71 %
Platelets: 26 10*3/uL — CL (ref 150–400)
RBC: 2.65 MIL/uL — ABNORMAL LOW (ref 4.22–5.81)
RDW: 17.7 % — ABNORMAL HIGH (ref 11.5–15.5)
WBC: 3.7 10*3/uL — ABNORMAL LOW (ref 4.0–10.5)
nRBC: 15.2 % — ABNORMAL HIGH (ref 0.0–0.2)

## 2021-05-31 LAB — COMPREHENSIVE METABOLIC PANEL
ALT: 14 U/L (ref 0–44)
AST: 47 U/L — ABNORMAL HIGH (ref 15–41)
Albumin: 2.9 g/dL — ABNORMAL LOW (ref 3.5–5.0)
Alkaline Phosphatase: 146 U/L — ABNORMAL HIGH (ref 38–126)
Anion gap: 8 (ref 5–15)
BUN: 65 mg/dL — ABNORMAL HIGH (ref 6–20)
CO2: 23 mmol/L (ref 22–32)
Calcium: 8.7 mg/dL — ABNORMAL LOW (ref 8.9–10.3)
Chloride: 104 mmol/L (ref 98–111)
Creatinine, Ser: 2.3 mg/dL — ABNORMAL HIGH (ref 0.61–1.24)
GFR, Estimated: 32 mL/min — ABNORMAL LOW (ref >60)
Glucose, Bld: 143 mg/dL — ABNORMAL HIGH (ref 70–99)
Potassium: 4.1 mmol/L (ref 3.5–5.1)
Sodium: 135 mmol/L (ref 135–145)
Total Bilirubin: 0.8 mg/dL (ref 0.3–1.2)
Total Protein: 6.4 g/dL — ABNORMAL LOW (ref 6.5–8.1)

## 2021-05-31 LAB — CBC
HCT: 27.3 % — ABNORMAL LOW (ref 39.0–52.0)
Hemoglobin: 9.2 g/dL — ABNORMAL LOW (ref 13.0–17.0)
MCH: 30.6 pg (ref 26.0–34.0)
MCHC: 33.7 g/dL (ref 30.0–36.0)
MCV: 90.7 fL (ref 80.0–100.0)
Platelets: 27 10*3/uL — CL (ref 150–400)
RBC: 3.01 MIL/uL — ABNORMAL LOW (ref 4.22–5.81)
RDW: 17.8 % — ABNORMAL HIGH (ref 11.5–15.5)
WBC: 3.7 10*3/uL — ABNORMAL LOW (ref 4.0–10.5)
nRBC: 17.2 % — ABNORMAL HIGH (ref 0.0–0.2)

## 2021-05-31 LAB — MAGNESIUM: Magnesium: 2.4 mg/dL (ref 1.7–2.4)

## 2021-05-31 LAB — PHOSPHORUS: Phosphorus: 4 mg/dL (ref 2.5–4.6)

## 2021-05-31 NOTE — Consult Note (Signed)
Palliative Care Consult Note                                  Date: 05/31/2021   Patient Name: Jonathan Campbell  DOB: [birth year]  MRN: [id number]  Age / Sex: 60 y.o., male  PCP: Jonathan Side., FNP Referring Physician: Elodia Florence., *  Reason for Consultation: Establishing goals of care  HPI/Patient Profile: Palliative Care consult requested for goals of care discussion in this 60 y.o. male  with past medical history of  metastatic prostate cancer with bone mets, T12 compression fracture, and anemia. He was admitted on 05/27/2021 from with confusion, AKI, and anemia. He is followed by Dr. Alen Campbell (Oncologist) and Dr. Sondra Campbell (Rad Onc). Per Dr. Hazeline Campbell recent discussion with family patient has incurable disease which is approaching end-stage. There are no further chemotherapy options. Plans to have Radiation evaluate for possible whole brain radiation pending MRI results.   Past Medical History:  Diagnosis Date  . Compression fracture of T12 vertebra ([facility])   . Prostate cancer metastatic to bone [facility])      Subjective:   This NP Jonathan Campbell reviewed medical records, received report from team, assessed the patient and then met at the patient's bedside with patient's significant other, Jonathan Campbell, his aunt (who he refers to as his mother due to their close relationship), and his 2 cousins to discuss diagnosis, prognosis, GOC, EOL wishes disposition and options.  Jonathan Campbell is awake and alert during my visit. He is able to answer orientation questions correctly however noticeable intermittent confusion during discussion. Able to follow commands and engage in goals of care.    Concept of Palliative Care was introduced as specialized medical care for people and their families living with serious illness.  It focuses on providing relief from the symptoms and stress of a serious illness.  The goal is to improve quality of life for both the  patient and the family. Values and goals of care important to patient and family were attempted to be elicited.  I created space and opportunity for patient and family to explore state of health prior to admission, thoughts, and feelings. Patient and girlfriend states 3-4 weeks prior to admission Jonathan Campbell was fully functional and independent of ADLs. Appetite good. Some abdominal pain and joint pain. He was driving however not as much as months prior.   We discussed His current illness and what it means in the larger context of His on-going co-morbidities. I discussed at length with family patient's metastatic cancer, plan of care, and poor prognosis. Natural disease trajectory and expectations were discussed.  Everyone verbalized understanding of current illness and co-morbidities. [name] states "it is what it is.". Jonathan Campbell (significant other) is tearful during discussions. His family (aunt and cousins) verbalized their understanding and with concerns for patient to suffer or to continue work-up with an understandable he will not have a meaningful recovery or improvement. Emotional support provided.   Jonathan Campbell shares she would want everything done to allow Jonathan Campbell every opportunity to thrive. I shared with girlfriend to focus all decisions around patient's quality of life and overall outcome/prognosis. She verbalizes understanding expressing she needs time to process and engage in family discussions on his behalf.   I discussed the importance of continued conversation with family and their medical providers regarding overall plan of care and treatment options, ensuring decisions are within the context of the patients  values and GOCs.  Questions and concerns were addressed. The family was encouraged to call with questions or concerns.  PMT will continue to support holistically as needed.  Life Review: Jonathan Campbell lives in the home with his girlfriend of more than 20 years (off and on). Former Training and development officer. He has one  daughter who resides in [place], in addition to other strong family support. Jonathan Campbell faith.    Objective:   Primary Diagnoses: Present on Admission: . AKI (acute Campbell injury) ([place]) . Prostate cancer metastatic to bone ([place]) . Symptomatic anemia . Anemia of chronic disease . Thrombocytopenia ([place]) . Abnormal LFTs (liver function tests) . Hyponatremia . Mild protein malnutrition ([place]) . Bilateral hydronephrosis . Acute encephalopathy   Scheduled Meds: . dexamethasone (DECADRON) injection  4 mg Intravenous Q12H  . mouth rinse  15 mL Mouth Rinse BID  . oxyCODONE  10 mg Oral Q12H    Continuous Infusions: . sodium bicarbonate 150 mEq in D5W infusion 100 mL/hr at 05/30/21 1545    PRN Meds: acetaminophen **OR** acetaminophen, ondansetron **OR** ondansetron (ZOFRAN) IV, oxyCODONE  No Known Allergies  Review of Systems  Constitutional:  Positive for appetite change.  Neurological:  Positive for weakness.  Unless otherwise noted, a complete review of systems is negative.  Physical Exam General: NAD, frail chronically-ill appearing Cardiovascular: regular rate and rhythm Pulmonary: clear ant fields normal breathing pattern Abdomen: soft, nontender, + bowel sounds Extremities: no edema, no joint deformities Skin: no rashes, warm and dry Neurological: AAO x3, with known intermittent confusion, follows commands  Vital Signs:  BP 104/61 (BP Location: Right Arm)   Pulse 79   Temp 98.5 F (36.9 C) (Oral)   Resp 20   Ht 5' 9" (1.753 m)   Wt 63.3 kg   SpO2 100%   BMI 20.61 kg/m  Pain Scale: 0-10 POSS *See Group Information*: S-Acceptable,Sleep, easy to arouse Pain Score: 0-No pain  SpO2: SpO2: 100 % O2 Device:SpO2: 100 % O2 Flow Rate: .   IO: Intake/output summary:  Intake/Output Summary (Last 24 hours) at 05/31/2021 0016 Last data filed at 05/30/2021 1844 Gross per 24 hour  Intake 1910.94 ml  Output 1000 ml  Net 910.94 ml    LBM: Last BM Date:  05/27/21 Baseline Weight: Weight: 77.1 kg Most recent weight: Weight: 63.3 kg      Palliative Assessment/Data: PPS 30%   Advanced Care Planning:   Primary Decision Maker: NEXT OF KIN  Code Status/Advance Care Planning: Full code  A discussion was had today regarding advanced directives. Concepts specific to code status, artifical feeding and hydration, continued IV antibiotics and rehospitalization was had.    Patient and Jonathan Campbell shares he does not have a completed advance directive. Jonathan Campbell (significant other) is requesting assistance in completing advanced directive. Education provided on such document and this can be completed while hospitalized if patient is able to answer all questions appropriately with no evidence of ongoing confusion. Family verbalized understanding.   In the event of no documents legally all decisions would be lead by next living relative (children) and then siblings.   The difference between a aggressive medical intervention path and a palliative comfort care path was discussed.   I discussed at length patient's full code status with consideration of his current illnesses and co-morbidities. Strong recommendations for DNR/DNI provided given poor prognosis.  Patient's family verbalized understanding and does not wish for patient to suffer, however Jonathan Campbell is not in agreement and request patient to remain a full code. When asking questions  to family he defers his response saying "I will do whatever they say or recommend!"   Hospice and Palliative Care services outpatient were explained and offered. Patient and family verbalized their understanding and awareness of both palliative and hospice's goals and philosophy of care.    Assessment & Plan:   SUMMARY OF RECOMMENDATIONS   Full Code-as confirmed by patient and family. Emphasized need for ongoing discussions and consideration of DNR/DNI Continue with current plan of care, family is open to all aggressive interventions  including nephrostomy tubes if appropriate.  Jonathan Campbell was clear goals are to gain a better quality of life and comfort. Education provided on Palliative and our role in his care during hospitalization and at discharge. Family is aware I am available at the cancer center for follow-up.  In the event of no advanced directive, legally patient's daughter would be next of [place] maker unless documentation is available and states otherwise.  Encouraged ongoing goals of care discussions.  PMT will continue to support and follow as needed. Please call team line with urgent needs. I will be off service returning on Monday 12/5.   Palliative Prophylaxis:  Aspiration, Bowel Regimen, Delirium Protocol, Frequent Pain Assessment, and Oral Care  Additional Recommendations (Limitations, Scope, Preferences): Minimize Medications  Psycho-social/Spiritual:  Desire for further Chaplaincy support: yes Additional Recommendations:  Ongoing goals of care speciatly  Prognosis:  < 6 months  Discharge Planning:  Home with Hospice   Discussed with: Dr. Florene Glen and RN.     Family expressed understanding and was in agreement with this plan.   Time In: 1030 Time Out: 1145 Time Total: 75 min   Visit consisted of counseling and education dealing with the complex and emotionally intense issues of symptom management and palliative care in the setting of serious and potentially life-threatening illness.Greater than 50%  of this time was spent counseling and coordinating care related to the above assessment and plan.  Signed by:  Alda Lea, AGPCNP-BC Palliative Medicine Team  Phone: [phone] Pager: [phone] Amion: Bjorn Pippin   Thank you for allowing the Palliative Medicine Team to assist in the care of this patient. Please utilize secure chat with additional questions, if there is no response within 30 minutes please call the above phone number. Palliative Medicine Team providers are  available by phone from 7am to 5pm daily and can be reached through the team cell phone.  Should this patient require assistance outside of these hours, please call the patient's attending physician.

## 2021-05-31 NOTE — Progress Notes (Signed)
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  He was brought in t to the hospital for evaluation because of altered mental status Seen and examined at bedside Denies any complaints at this time Her daughter who came from [place] this morning stated that she wanted to be contacted for any information and updates.  Assessment/Plan: Principal Problem:   Acute metabolic encephalopathy Active Problems:   Prostate cancer metastatic to bone (HCC)   AKI (acute kidney injury) ([place])   Symptomatic anemia   Anemia of chronic disease   Thrombocytopenia (HCC)   Abnormal LFTs (liver function tests)   Hyponatremia   Mild protein malnutrition (HCC)   Bilateral hydronephrosis   Acute encephalopathy   Brain lesion   Metabolic acidosis #1 metastatic prostate cancer Patient had failed multiple therapies now he has brain mets.  Oncology thinks that the prognosis is poor and has limited life expectancy and is in favor of continuing palliative care including potentially having radiation to the brain steroid and transfusion as needed. Urology does not think that he is a candidate for nephrostomy tube given his platelet count of 26 in addition and I do not think it will prolong his life and they are recommending palliative care  Acute metabolic encephalopathy In the setting of brain metastasis Patient is on dexamethasone and supportive care They are planning on possibly doing palliative radiation therapy to the whole brain when the MRI result is available  AKI In the setting of moderate right-sided hydronephrosis and mild left-sided hydronephrosis Consult continue conservative management  Symptomatic anemia In the setting of malignancy Patient had 2 units of blood transfusion right  recently We will continue to monitor and transfuse as needed  Thrombocytopenia in the setting of malignancy     Code Status: Full  Severity of Illness: The appropriate patient status for this patient is INPATIENT. Inpatient status is judged to be reasonable and necessary in order to provide the required intensity of service to ensure the patient's safety. The patient's presenting symptoms, physical exam findings, and initial radiographic and laboratory data in the context of their chronic comorbidities is felt to place them at high risk for further clinical deterioration. Furthermore, it is not anticipated that the patient will be medically stable for discharge from the hospital within 2 midnights of admission.   * I certify that at the point of admission it is my clinical judgment that the patient will require inpatient hospital care spanning beyond 2 midnights from the point of admission due to high intensity of service, high risk for further deterioration and high frequency of surveillance required.*   Family Communication: Daughter Tressia Miners and her husband as well as patient's girlfriend in the room  Disposition Plan:   Status is: Inpatient   Dispo: The patient is from: Home              Anticipated d/c is to:               Anticipated d/c date is:               Patient currently not medically stable for discharge  Consultants: Oncology Urology Palliative care  Procedures: None  Antimicrobials: None  DVT prophylaxis: SCD   Objective: Vitals:   05/30/21 0641 05/30/21 1547 05/30/21 2155 05/31/21 0557  BP: 99/60  113/68 104/61 107/67  Pulse: 84 97 79 81  Resp: 16  20 20   Temp: 98 F (36.7 C) 98.3 F (36.8 C) 98.5 F (36.9 C) 98.2 F (36.8 C)  TempSrc: Oral Oral Oral Oral  SpO2: 100% 100% 100% 97%  Weight:      Height:        Intake/Output Summary (Last 24 hours) at 05/31/2021 0839 Last data filed at 05/31/2021 0600 Gross per 24 hour  Intake 2403.26 ml   Output 1000 ml  Net 1403.26 ml   Filed Weights   05/27/21 2003 05/29/21 0539  Weight: 77.1 kg 63.3 kg   Body mass index is 20.61 kg/m.  Exam:  General: 60 y.o. year-old male well developed well nourished in no acute distress.  Alert and oriented x3. Cardiovascular: Regular rate and rhythm with no rubs or gallops.  No thyromegaly or JVD noted.   Respiratory: Clear to auscultation with no wheezes or rales. Good inspiratory effort. Abdomen: Soft nontender nondistended with normal bowel sounds x4 quadrants. Musculoskeletal: No lower extremity edema. 2/4 pulses in all 4 extremities. Skin: No ulcerative lesions noted or rashes, Psychiatry: Mood is appropriate for condition and setting Neurology:    Data Reviewed: CBC: Recent Labs  Lab 05/27/21 2112 05/29/21 0936 05/30/21 0755 05/30/21 1627 05/31/21 0513  WBC 6.0 3.7* 3.4* 4.1 3.7*  NEUTROABS 4.8  --  2.7  --  2.7  HGB 6.1* 9.2* 8.1* 8.7* 8.2*  HCT 18.6* 27.3* 24.2* 26.3* 24.3*  MCV 94.9 90.7 90.3 92.6 91.7  PLT 42* 27* 24* 26* 26*   Basic Metabolic Panel: Recent Labs  Lab 05/27/21 2112 05/29/21 0936 05/30/21 0755 05/31/21 0513  NA 131* 132* 133* 135  K 5.0 4.4 4.4 4.1  CL 103 106 107 104  CO2 12* 13* 16* 23  GLUCOSE 130* 131* 111* 143*  BUN 83* 73* 77* 65*  CREATININE 3.18* 2.50* 2.70* 2.30*  CALCIUM 9.7 9.1 8.9 8.7*  MG  --   --   --  2.4  PHOS  --   --   --  4.0   GFR: Estimated Creatinine Clearance: 30.6 mL/min (A) (by C-G formula based on SCr of 2.3 mg/dL (H)). Liver Function Tests: Recent Labs  Lab 05/27/21 2112 05/29/21 0936 05/30/21 0755 05/31/21 0513  AST 49* 47* 42* 47*  ALT 15 14 14 14   ALKPHOS 180* 168* 151* 146*  BILITOT 1.7* 1.3* 0.9 0.8  PROT 8.0 7.0 6.9 6.4*  ALBUMIN 3.4* 3.2* 3.1* 2.9*   No results for input(s): LIPASE, AMYLASE in the last 168 hours. No results for input(s): AMMONIA in the last 168 hours. Coagulation Profile: No results for input(s): INR, PROTIME in the last 168  hours. Cardiac Enzymes: No results for input(s): CKTOTAL, CKMB, CKMBINDEX, TROPONINI in the last 168 hours. BNP (last 3 results) No results for input(s): PROBNP in the last 8760 hours. HbA1C: No results for input(s): HGBA1C in the last 72 hours. CBG: No results for input(s): GLUCAP in the last 168 hours. Lipid Profile: No results for input(s): CHOL, HDL, LDLCALC, TRIG, CHOLHDL, LDLDIRECT in the last 72 hours. Thyroid Function Tests: No results for input(s): TSH, T4TOTAL, FREET4, T3FREE, THYROIDAB in the last 72 hours. Anemia Panel: No results for input(s): VITAMINB12, FOLATE, FERRITIN, TIBC, IRON, RETICCTPCT in the last 72 hours. Urine analysis:    Component Value Date/Time   COLORURINE YELLOW 05/28/2021 [place] 05/28/2021 0650   LABSPEC 1.011 05/28/2021 0650   PHURINE 5.0 05/28/2021 0650  GLUCOSEU NEGATIVE 05/28/2021 0650   HGBUR SMALL (A) 05/28/2021 0650   BILIRUBINUR NEGATIVE 05/28/2021 0650   KETONESUR 5 (A) 05/28/2021 0650   PROTEINUR NEGATIVE 05/28/2021 0650   NITRITE NEGATIVE 05/28/2021 0650   LEUKOCYTESUR NEGATIVE 05/28/2021 0650   Sepsis Labs: @LABRCNTIP (procalcitonin:4,lacticidven:4)  ) Recent Results (from the past 240 hour(s))  Resp Panel by RT-PCR (Flu A&B, Covid) Nasopharyngeal Swab     Status: None   Collection Time: 05/28/21  3:16 PM   Specimen: Nasopharyngeal Swab; Nasopharyngeal(NP) swabs in vial transport medium  Result Value Ref Range Status   SARS Coronavirus 2 by RT PCR NEGATIVE NEGATIVE Final    Comment: (NOTE) SARS-CoV-2 target nucleic acids are NOT DETECTED.  The SARS-CoV-2 RNA is generally detectable in upper respiratory specimens during the acute phase of infection. The lowest concentration of SARS-CoV-2 viral copies this assay can detect is 138 copies/mL. A negative result does not preclude SARS-Cov-2 infection and should not be used as the sole basis for treatment or other patient management decisions. A negative result  may occur with  improper specimen collection/handling, submission of specimen other than nasopharyngeal swab, presence of viral mutation(s) within the areas targeted by this assay, and inadequate number of viral copies(<138 copies/mL). A negative result must be combined with clinical observations, patient history, and epidemiological information. The expected result is Negative.  Fact Sheet for Patients:  EntrepreneurPulse.com.au  Fact Sheet for Healthcare Providers:  IncredibleEmployment.be  This test is no t yet approved or cleared by the [place] FDA and  has been authorized for detection and/or diagnosis of SARS-CoV-2 by FDA under an Emergency Use Authorization (EUA). This EUA will remain  in effect (meaning this test can be used) for the duration of the COVID-19 declaration under Section 564(b)(1) of the Act, 21 U.S.C.section 360bbb-3(b)(1), unless the authorization is terminated  or revoked sooner.       Influenza A by PCR NEGATIVE NEGATIVE Final   Influenza B by PCR NEGATIVE NEGATIVE Final    Comment: (NOTE) The Xpert Xpress SARS-CoV-2/FLU/RSV plus assay is intended as an aid in the diagnosis of influenza from Nasopharyngeal swab specimens and should not be used as a sole basis for treatment. Nasal washings and aspirates are unacceptable for Xpert Xpress SARS-CoV-2/FLU/RSV testing.  Fact Sheet for Patients: EntrepreneurPulse.com.au  Fact Sheet for Healthcare Providers: IncredibleEmployment.be  This test is not yet approved or cleared by the [place] FDA and has been authorized for detection and/or diagnosis of SARS-CoV-2 by FDA under an Emergency Use Authorization (EUA). This EUA will remain in effect (meaning this test can be used) for the duration of the COVID-19 declaration under Section 564(b)(1) of the Act, 21 U.S.C. section 360bbb-3(b)(1), unless the authorization is terminated  or revoked.  Performed at [facility], [place] [address], [place], [place] [address]       Studies: No results found.  Scheduled Meds:  dexamethasone (DECADRON) injection  4 mg Intravenous Q12H   mouth rinse  15 mL Mouth Rinse BID   oxyCODONE  10 mg Oral Q12H    Continuous Infusions:  sodium bicarbonate 150 mEq in D5W infusion 100 mL/hr at 05/31/21 0102     LOS: 3 days     Cristal Deer, MD [facility]  To reach me or the doctor on call, go to: [email or web address] Password [facility]  05/31/2021, 8:39 AM

## 2021-06-01 ENCOUNTER — Ambulatory Visit
Admit: 2021-06-01 | Discharge: 2021-06-01 | Disposition: A | Discharge status: Home / Self Care | Payer: Medicare Other | Attending: Radiation Oncology | Admitting: Radiation Oncology

## 2021-06-01 DIAGNOSIS — G9341 Metabolic encephalopathy: Secondary | ICD-10-CM

## 2021-06-01 DIAGNOSIS — C61 Malignant neoplasm of prostate: Secondary | ICD-10-CM

## 2021-06-01 LAB — PATHOLOGIST SMEAR REVIEW

## 2021-06-01 MED ORDER — LORAZEPAM 1 MG PO TABS: 1.0000 mg | ORAL_TABLET | ORAL | Status: DC | PRN

## 2021-06-01 MED ORDER — ACETAMINOPHEN 650 MG RE SUPP: 650.0000 mg | Freq: Four times a day (QID) | RECTAL | Status: DC | PRN

## 2021-06-01 MED ORDER — MORPHINE SULFATE (PF) 2 MG/ML IV SOLN: 2.0000 mg | INTRAVENOUS | Status: DC | PRN

## 2021-06-01 MED ORDER — ACETAMINOPHEN 325 MG PO TABS: 650.0000 mg | ORAL_TABLET | Freq: Four times a day (QID) | ORAL | Status: DC | PRN

## 2021-06-01 MED ORDER — ONDANSETRON 4 MG PO TBDP: 4.0000 mg | ORAL_TABLET | Freq: Four times a day (QID) | ORAL | Status: DC | PRN

## 2021-06-01 MED ORDER — MAGIC MOUTHWASH: 15.0000 mL | Freq: Four times a day (QID) | ORAL | Status: DC | PRN

## 2021-06-01 MED ORDER — POLYVINYL ALCOHOL 1.4 % OP SOLN: 1.0000 [drp] | Freq: Four times a day (QID) | OPHTHALMIC | Status: DC | PRN

## 2021-06-01 MED ORDER — ONDANSETRON HCL 4 MG/2ML IJ SOLN: 4.0000 mg | Freq: Four times a day (QID) | INTRAMUSCULAR | Status: DC | PRN

## 2021-06-01 MED ORDER — BIOTENE DRY MOUTH MT LIQD: 15.0000 mL | OROMUCOSAL | Status: DC | PRN

## 2021-06-01 MED ORDER — DIPHENHYDRAMINE HCL 50 MG/ML IJ SOLN: 12.5000 mg | INTRAMUSCULAR | Status: DC | PRN

## 2021-06-01 MED ORDER — LORAZEPAM 2 MG/ML PO CONC: 1.0000 mg | ORAL | Status: DC | PRN

## 2021-06-01 MED ORDER — RISPERIDONE 0.5 MG PO TBDP
0.5000 mg | ORAL_TABLET | Freq: Two times a day (BID) | ORAL | Status: DC
  Administered 2021-06-01 – 2021-06-02 (×2): 0.5 mg via ORAL
  Filled 2021-06-01 (×3): qty 1

## 2021-06-01 MED ORDER — BACLOFEN 10 MG PO TABS: 5.0000 mg | ORAL_TABLET | Freq: Two times a day (BID) | ORAL | Status: DC | PRN

## 2021-06-01 MED ORDER — MORPHINE SULFATE (CONCENTRATE) 10 MG/0.5ML PO SOLN: 5.0000 mg | ORAL | Status: DC | PRN

## 2021-06-01 MED ORDER — MORPHINE SULFATE (PF) 2 MG/ML IV SOLN: 1.0000 mg | INTRAVENOUS | Status: DC | PRN

## 2021-06-01 MED ORDER — LORAZEPAM 2 MG/ML IJ SOLN: 1.0000 mg | INTRAMUSCULAR | Status: DC | PRN

## 2021-06-01 MED ORDER — DEXTROSE-NACL 5-0.9 % IV SOLN: INTRAVENOUS | Status: DC

## 2021-06-01 MED ORDER — BACLOFEN 10 MG PO TABS
5.0000 mg | ORAL_TABLET | Freq: Three times a day (TID) | ORAL | Status: DC
  Administered 2021-06-01 – 2021-06-02 (×2): 5 mg via ORAL
  Filled 2021-06-01 (×5): qty 1

## 2021-06-01 NOTE — Progress Notes (Signed)
Radiation Oncology         [phone] ________________________________  Inpatient Re-Consultation  Name: Jonathan Campbell MRN: [id number]  Date: 06/01/2021  DOB: Feb 15, 1961  VZ:DGLOV, Malva Limes., FNP  Wyatt Portela, MD   REFERRING PHYSICIAN: Wyatt Portela, MD  DIAGNOSIS: There were no encounter diagnoses.   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 Left Scapula / 30 Gy in 10 fractions 2. Lumbar Spine (L4 region) / 30 Gy in 10 fractions   09/16/2017 - 09/29/2017: T Spine / 30 Gy in 10 fractions  Patient also recently received radium 223 injections.   HISTORY OF PRESENT ILLNESS::Jonathan Campbell is a 60 y.o. male who is seen as a courtesy of Dr. Alen Blew for an opinion concerning radiation therapy as part of management for his diagnosed metastatic prostate cancer. The patient was last seen by me for re-evaluation on 10/22/20.   The patient presented to the ED on 05/27/21 with decreased mentation and decreased oral intake for several days.  Upon evaluation, the patient was unable to provide information and history was obtained by calling his significant other. Per the patient's significant other, the patient had not been taking his medications and had missed his appointments (patient was a no-show for our last follow-up as well). The patient was accordingly admitted for further evaluation.   CT of the head without contrast on 05/27/21 revealed vasogenic pattern edema in the right greater than left frontal lobes, likely crossing at the corpus callosum, concerning for a high-grade neoplasm.   Renal stone CT study on 05/27/21 showed enlargement of the prostate gland as well as a new masslike protrusion invading the right bladder base. Right bladder wall  thickening was also appreciated. Overall, findings were noted as worrisome for prostate cancer with direct extension. New bulky lymphadenopathy was also seen throughout the retroperitoneum, iliac chains, and inguinal regions worrisome for metastatic disease or lymphoma. New enlarged bilateral axillary lymph nodes were appreciated as well.   MRI of the brain on 05/31/21 further revealed abnormal extra-axial signal along the cerebral convexities, favored to represent dural based metastatic disease with associated edema. Diffuse osseous metastatic disease was also appreciated. Additionally, an increase in moderate hepatomegaly was seen since prior examination, as well as an increase in size of some of the numerous hepatic cysts. The largest lesion appreciated was located in the dome of the liver, measuring 10 cm and appearing mildly hyperdense. (Liver lesion was noted as possibly due to interval hemorrhage within this cyst). Lastly, mixed lucent and sclerotic lesions throughout the osseous structures were appreciated, compatible with metastatic disease.  Other pertinent imaging since the patient was last seen for re-consultation includes MRI of the right shoulder on 03/06/21 which demonstrated a pattern of extensive red marrow redistribution in the scapula, clavicle, and proximal humerus, with high T2 and low T1 irregular signaling (but with sparing around the [facility] joint and humeral Head). A tumor was noted as possibly hidden in the background of high T2 signaling and enhancing red marrow. Scattered right axillary lymph nodes measuring up to 1.2 cm in diameter were appreciated as well, noted as substantially enlarged compared to the prior chest CT on 09/13/2017.  Of note: prior to recent admission, the patient last followed up with Dr.  Shadad on 02/20/21. During which time, the patient was noted to tolerate Xofigo well without any major complications and some improvement in his overall pain.  Patient was also  noted to be ambulating better and overall functioning at a higher level than previously.  He did report some mild fatigue and tiredness but no chest pain or shortness of breath.  However, the patient was noted to have developed worsening anemia and thrombocytopenia which was possibly related to malignancy and infiltrating disease in the bone marrow versus Xofigo effect.    OTHER THERAPIES:   Lupron 30 mg given starting on 10/05/2017.     Xtandi 80 mg daily started in June 2021.   Trudi Ida started in June 2022 (for bony disease).  He completed 3 months of therapy.   PAST MEDICAL HISTORY:  Past Medical History:  Diagnosis Date   Compression fracture of T12 vertebra ([place])    Prostate cancer metastatic to bone [facility])     PAST SURGICAL HISTORY: Past Surgical History:  Procedure Laterality Date   IR BONE TUMOR(S)RF ABLATION  11/22/2017   IR KYPHO THORACIC WITH BONE BIOPSY  11/22/2017   IR RADIOLOGIST EVAL & MGMT  11/09/2017   NO PAST SURGERIES      FAMILY HISTORY: No family history on file.  SOCIAL HISTORY:  Social History   Tobacco Use   Smoking status: Light Smoker    Types: Cigars   Smokeless tobacco: Never  Vaping Use   Vaping Use: Never used  Substance Use Topics   Alcohol use: Yes    Comment: occ   Drug use: Yes    Types: Marijuana    Comment: occ    ALLERGIES: No Known Allergies  MEDICATIONS:  No current facility-administered medications for this encounter.   No current outpatient medications on file.   Facility-Administered Medications Ordered in Other Encounters  Medication Dose Route Frequency Provider Last Rate Last Admin   acetaminophen (TYLENOL) tablet 650 mg  650 mg Oral Q6H PRN Reubin Milan, MD   650 mg at 05/28/21 2128   Or   acetaminophen (TYLENOL) suppository 650 mg  650 mg Rectal Q6H PRN Reubin Milan, MD       dexamethasone (DECADRON) injection 4 mg  4 mg Intravenous Q12H Reubin Milan, MD   4 mg at 06/01/21 1010   dextrose 5 %-0.9  % sodium chloride infusion   Intravenous Continuous Amin, Jeanella Flattery, MD       MEDLINE mouth rinse  15 mL Mouth Rinse BID Elodia Florence., MD   15 mL at 06/01/21 1009   ondansetron (ZOFRAN) tablet 4 mg  4 mg Oral Q6H PRN Reubin Milan, MD       Or   ondansetron [facility]) injection 4 mg  4 mg Intravenous Q6H PRN Reubin Milan, MD       oxyCODONE (Oxy IR/ROXICODONE) immediate release tablet 10 mg  10 mg Oral Q4H PRN Elodia Florence., MD   10 mg at 05/31/21 1826   oxyCODONE (OXYCONTIN) 12 hr tablet 10 mg  10 mg Oral Q12H Elodia Florence., MD   10 mg at 06/01/21 1009      PHYSICAL EXAM:  General: Alert and oriented, in no acute distress, patient is lying in his hospital bed.  At this time he does not respond to questions asked. HEENT: Head is normocephalic.  Neck: Neck is supple, palpable adenopathy present in the supraclavicular region. Heart: Regular in rate and rhythm with no  murmurs, rubs, or gallops. Chest: Clear to auscultation bilaterally, with no rhonchi, wheezes, or rales. Abdomen: Soft, nontender, nondistended, with no rigidity or guarding. Extremities: No cyanosis or edema. Lymphatics: see Neck Exam Patient appears confused and does not respond to questions or to commands.   ECOG = 4  0 - Asymptomatic (Fully active, able to carry on all predisease activities without restriction)  1 - Symptomatic but completely ambulatory (Restricted in physically strenuous activity but ambulatory and able to carry out work of a light or sedentary nature. For example, light housework, office work)  2 - Symptomatic, <50% in bed during the day (Ambulatory and capable of all self care but unable to carry out any work activities. Up and about more than 50% of waking hours)  3 - Symptomatic, >50% in bed, but not bedbound (Capable of only limited self-care, confined to bed or chair 50% or more of waking hours)  4 - Bedbound (Completely disabled. Cannot carry on any  self-care. Totally confined to bed or chair)  5 - Death   Eustace Pen MM, Creech RH, Tormey DC, et al. [id number]). "Toxicity and response criteria of the [facility] Group". [place] Oncol. 5 (6): 649-55  LABORATORY DATA:  Lab Results  Component Value Date   WBC 3.7 (L) 05/31/2021   HGB 8.2 (L) 05/31/2021   HCT 24.3 (L) 05/31/2021   MCV 91.7 05/31/2021   PLT 26 (LL) 05/31/2021   NEUTROABS 2.7 05/31/2021   Lab Results  Component Value Date   NA 135 05/31/2021   K 4.1 05/31/2021   CL 104 05/31/2021   CO2 23 05/31/2021   GLUCOSE 143 (H) 05/31/2021   CREATININE 2.30 (H) 05/31/2021   CALCIUM 8.7 (L) 05/31/2021      RADIOGRAPHY: CT HEAD WO CONTRAST (5MM)  Result Date: 05/27/2021 CLINICAL DATA:  Encephalopathy EXAM: CT HEAD WITHOUT CONTRAST TECHNIQUE: Contiguous axial images were obtained from the base of the skull through the vertex without intravenous contrast. COMPARISON:  None. FINDINGS: Brain: Vasogenic pattern edema in the right greater than left frontal lobes, likely crossing at the corpus callosum. This is concerning for a high-grade neoplasm. No acute hemorrhage or extra-axial collection. No hydrocephalus. Mild leftward midline shift. Vascular: No abnormal hyperdensity of the major intracranial arteries or dural venous sinuses. No intracranial atherosclerosis. Skull: The visualized skull base, calvarium and extracranial soft tissues are normal. Sinuses/Orbits: No fluid levels or advanced mucosal thickening of the visualized paranasal sinuses. No mastoid or middle ear effusion. The orbits are normal. IMPRESSION: 1. Vasogenic pattern edema in the right greater than left frontal lobes, likely crossing at the corpus callosum, concerning for a high-grade neoplasm. MRI of the brain with and without contrast is recommended for further characterization. 2. No acute hemorrhage. Electronically Signed   By: Ulyses Jarred M.D.   On: 05/27/2021 23:25   MR BRAIN WO CONTRAST  Result  Date: 05/31/2021 CLINICAL DATA:  Brain/CNS neoplasm, staging; prostate cancer, abnormal CT EXAM: MRI HEAD WITHOUT CONTRAST TECHNIQUE: Multiplanar, multiecho pulse sequences of the brain and surrounding structures were obtained without intravenous contrast. COMPARISON:  Correlation made with CT head 05/27/2021 FINDINGS: Brain: There is no acute infarction. Edema is present in the right greater than left frontal white matter as well as bilateral anterior temporal white matter. There is adjacent susceptibility in the right frontal lobe likely reflecting blood products or mineralization. A small area of edema is present in the parasagittal high left frontoparietal region (series 10, image 42). Bilateral cerebral convexity extra-axial  abnormal signal isointense to adjacent parenchyma with associated susceptibility that probably represents hyperostosis. Regional mass effect is present including effacement of the frontal horns and mild leftward midline shift similar to the prior head CT. Vascular: Major vessel flow voids at the skull base are preserved. Skull and upper cervical spine: Diffusely abnormal marrow signal reflecting metastatic disease. Sinuses/Orbits: Paranasal sinuses are aerated. Orbits are unremarkable. Other: Sella is unremarkable.  Mastoid air cells are clear. IMPRESSION: Edema in the frontal and temporal lobes bilaterally. Abnormal extra-axial signal along the cerebral convexities. Within limitation of noncontrast study, favored to represent dural based metastatic disease with associated edema. Mass effect including mild midline shift similar to prior CT. Diffuse osseous metastatic disease. Electronically Signed   By: Macy Mis M.D.   On: 05/31/2021 19:45   CT Renal Stone Study  Result Date: 05/27/2021 CLINICAL DATA:  Renal failure. History of metastatic prostate cancer. EXAM: CT ABDOMEN AND PELVIS WITHOUT CONTRAST TECHNIQUE: Multidetector CT imaging of the abdomen and pelvis was performed  following the standard protocol without IV contrast. COMPARISON:  CT abdomen and pelvis 08/28/2019. FINDINGS: Lower chest: No acute abnormality. Hepatobiliary: There is a rounded 10 cm lesion in the dome of the liver. This has mildly increased in and is now mildly hyperdense when compared to the prior study. Additional innumerable cystic structures throughout the liver are again noted, some of which have mildly increased in size. There is moderate hepatomegaly, mildly increased from prior study. The liver is enlarged, unchanged. Gallbladder appears within normal limits. Pancreas: Unremarkable. No pancreatic ductal dilatation or surrounding inflammatory changes. Spleen: Normal in size without focal abnormality. Adrenals/Urinary Tract: There is mild left and moderate right-sided hydroureteronephrosis to the level the pelvic inlet. No obstructing calculi are seen. No bladder calculi are seen. There is posterior right bladder wall thickening measuring up to 16 mm which is new. Stomach/Bowel: Stomach is within normal limits. Appendix appears normal. No evidence of bowel wall thickening, distention, or inflammatory changes. The appendix is not seen. Vascular/Lymphatic: Aorta is normal in size. There is new left retroperitoneal lymphadenopathy with the largest lymph node measuring 2.4 x 2.7 cm image 504/45. There is new bulky bilateral iliac chain lymphadenopathy. Discrete margins are difficult to evaluate secondary to lack of contrast and lack of intraperitoneal fat. Largest right soft tissue mass measures 4.2 x 4.1 cm. Largest left sided lymph node measures 3.6 x 2.5 cm. There are new bilateral enlarged inguinal lymph nodes measuring up to 2.1 x 3.3 cm. There also new bilateral enlarged axillary lymph nodes measuring up to 16 mm short axis. Reproductive: Prostate gland is heterogeneous enlarged. This has increased in size and now appears to invade the posterior right aspect of the bladder. Masslike density protruding  from the prostate at the right bladder base measures 4.6 x 5.0 cm. Other: There is no ascites or focal abdominal wall hernia. There is bilateral gynecomastia. Musculoskeletal: Vertebroplasty changes are again seen at T12. There are increasing mixed lucent and sclerotic lesions throughout the spine, sacrum and pelvis. No acute fractures are seen. IMPRESSION: 1. Prostate gland has enlarged in the interval and there is a new masslike protrusion invading the right bladder base. There is right bladder wall thickening. Findings are worrisome for prostate cancer with direct extension. 2. There is moderate right-sided hydronephrosis and mild left-sided hydronephrosis to the level of the pelvis. No obstructing calculus. 3. New bulky lymphadenopathy throughout the retroperitoneum, iliac chains and inguinal regions worrisome for metastatic disease or lymphoma. New enlarged bilateral axillary lymph  nodes. 4. Moderate hepatomegaly has increased from the prior examination. Numerous hepatic cysts are again seen, some of which have increased in size. Largest lesion in the dome of the liver measures 10 cm and now appears mildly hyperdense. This may be due to interval hemorrhage within this cyst. 5. Mixed lucent and sclerotic lesions throughout the osseous structures compatible with metastatic disease. Electronically Signed   By: Ronney Asters M.D.   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  He has progressive systemic disease which is resulting in significant issues at this time.  Since the patient did not respond to Decadron in terms of his mentation I do not feel brain radiation therapy would be of benefit to him.  Agree with transitioning to comfort care/hospice   30 minutes of total time was spent for this patient encounter, including preparation, face-to-face  counseling with the patient and coordination of care, physical exam, and documentation of the encounter.   ------------------------------------------------  Blair Promise, PhD, MD  This document serves as a record of services personally performed by Gery Pray, MD. It was created on his behalf by Roney Mans, a trained medical scribe. The creation of this record is based on the scribe's personal observations and the provider's statements to them. This document has been checked and approved by the attending provider.

## 2021-06-01 NOTE — Progress Notes (Signed)
IP PROGRESS NOTE  Subjective:   Events noted in the last few days.  Patient awake but somnolent.  Oriented to person but not providing much history at this time.  Objective:  Vital signs in last 24 hours: Temp:  [98.6 F (37 C)-98.8 F (37.1 C)] 98.8 F (37.1 C) (12/05 0456) Pulse Rate:  [82-94] 82 (12/05 0456) Resp:  [18-20] 20 (12/05 0456) BP: (104-118)/(64-67) 118/66 (12/05 0456) SpO2:  [98 %-100 %] 98 % (12/05 0456) Weight change:  Last BM Date: 05/27/21  Intake/Output from previous day: 12/04 0701 - 12/05 0700 In: -  Out: 1250 [Urine:1250] General: Lethargic but arousable.  Not in any distress.  Chronically ill. Head: Normocephalic atraumatic. Mouth: mucous membranes moist, pharynx normal without lesions Eyes: No scleral icterus.  Pupils are equal and round reactive to light. Resp: clear to auscultation bilaterally without rhonchi or wheezes or dullness to percussion. Cardio: regular rate and rhythm, S1, S2 normal, no murmur, click, rub or gallop GI: soft, non-tender; bowel sounds normal; no masses,  no organomegaly Musculoskeletal: No joint deformity or effusion. Neurological: No motor, sensory deficits.  Intact deep tendon reflexes. Skin: No rashes or lesions.   Lab Results: Recent Labs    05/30/21 1627 05/31/21 0513  WBC 4.1 3.7*  HGB 8.7* 8.2*  HCT 26.3* 24.3*  PLT 26* 26*    BMET Recent Labs    05/30/21 0755 05/31/21 0513  NA 133* 135  K 4.4 4.1  CL 107 104  CO2 16* 23  GLUCOSE 111* 143*  BUN 77* 65*  CREATININE 2.70* 2.30*  CALCIUM 8.9 8.7*    Studies/Results: MR BRAIN WO CONTRAST  Result Date: 05/31/2021 CLINICAL DATA:  Brain/CNS neoplasm, staging; prostate cancer, abnormal CT EXAM: MRI HEAD WITHOUT CONTRAST TECHNIQUE: Multiplanar, multiecho pulse sequences of the brain and surrounding structures were obtained without intravenous contrast. COMPARISON:  Correlation made with CT head 05/27/2021 FINDINGS: Brain: There is no acute infarction.  Edema is present in the right greater than left frontal white matter as well as bilateral anterior temporal white matter. There is adjacent susceptibility in the right frontal lobe likely reflecting blood products or mineralization. A small area of edema is present in the parasagittal high left frontoparietal region (series 10, image 42). Bilateral cerebral convexity extra-axial abnormal signal isointense to adjacent parenchyma with associated susceptibility that probably represents hyperostosis. Regional mass effect is present including effacement of the frontal horns and mild leftward midline shift similar to the prior head CT. Vascular: Major vessel flow voids at the skull base are preserved. Skull and upper cervical spine: Diffusely abnormal marrow signal reflecting metastatic disease. Sinuses/Orbits: Paranasal sinuses are aerated. Orbits are unremarkable. Other: Sella is unremarkable.  Mastoid air cells are clear. IMPRESSION: Edema in the frontal and temporal lobes bilaterally. Abnormal extra-axial signal along the cerebral convexities. Within limitation of noncontrast study, favored to represent dural based metastatic disease with associated edema. Mass effect including mild midline shift similar to prior CT. Diffuse osseous metastatic disease. Electronically Signed   By: Macy Mis M.D.   On: 05/31/2021 19:45    Medications: I have reviewed the patient's current medications.  Assessment/Plan:  60 year old with:  1.  Metastatic prostate cancer with with extensive bone and dural involvement.  He is a quite debilitated from his cancer and not a candidate for any additional systemic therapy.  Poor performance status as well as cytopenia is prohibitive for any additional anticancer treatment.     2.  Dural metastasis: MRI was reviewed and showed  edema in the frontal and temporal lobe bilaterally.  He is currently on dexamethasone.  He is under consideration for palliative radiation therapy.   3.   Pancytopenia: Related to infiltrative bone marrow process of prostate cancer.  His counts are adequate without any bleeding or need for any additional transfusion.   4.  Acute renal failure: Related to hydronephrosis and appears to be stable at this time.  I agree with no additional intervention.   5.  Prognosis and goals of care: Overall poor with limited life expectancy.  I favor transitioning to hospice after this hospitalization.  25  minutes were dedicated to this visit.  50% of the time was spent face-to-face and dedicated to reviewing imaging studies, treatment choices and future plan of care discussion.      LOS: 4 days   Zola Button 06/01/2021, 8:18 AM

## 2021-06-01 NOTE — Progress Notes (Signed)
PROGRESS NOTE    Jonathan Campbell  UXL:[id number] DOB: September 24, 1960 DOA: 05/27/2021 PCP: Sonia Side., FNP   Brief Narrative:  60 yo with hx prostate cancer metastatic to bone, T12 compression fracture, anemia of chronic disease who presented to the hospital for increased confusion.  He was admitted with AKI, anemia, and brian imaging concerning for metastatic disease.  Extensive work-up including MRI brain showed dural metastases and frontal and temporal lobe with edema.  Patient was started on dexamethasone.  Palliative care team has been seeing the patient.  Patient also has acute renal failure with bilateral hydronephrosis.  Seen by urology, no role for intervention.   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  Overall very poor prognosis and limited life expectancy.  Seen by palliative care services.  Recommend transitioning patient to hospice once family is agreed to this.  Seen by urology and oncology, no further treatment options available.  Acute kidney injury with bilateral hydronephrosis - No further treatment options available per oncology team.  Symptomatic anemia - Received transfusion.  At this time patient remains extremely ill with very poor prognosis.  I am not sure if there is any further role of medical treatment and it is recommended that he should be transition to hospice/comfort care.     DVT prophylaxis: Place and maintain sequential compression device Start: 05/29/21 2020 SCDs  Start: 05/28/21 0800  Code Status: Full code Family Communication: Reached out to significant other and brother but no response.  Status is: Inpatient  Remains inpatient appropriate because: Maintain hospital stay until we have safe disposition       Nutritional status           Body mass index is 20.61 kg/m.           Subjective: Very somnolent.  Answers basic question yes or no.  Denies any complaints.  Review of Systems Otherwise negative except as per HPI, including: General: Denies fever, chills, night sweats or unintended weight loss. Resp: Denies cough, wheezing, shortness of breath. Cardiac: Denies chest pain, palpitations, orthopnea, paroxysmal nocturnal dyspnea. GI: Denies abdominal pain, nausea, vomiting, diarrhea or constipation GU: Denies dysuria, frequency, hesitancy or incontinence MS: Denies muscle aches, joint pain or swelling Neuro: Denies headache, neurologic deficits (focal weakness, numbness, tingling), abnormal gait Psych: Denies anxiety, depression, SI/HI/AVH Skin: Denies new rashes or lesions ID: Denies sick contacts, exotic exposures, travel  Examination:  Constitutional: Not in acute distress, somnolent Respiratory: Clear to auscultation bilaterally Cardiovascular: Normal sinus rhythm, no rubs Abdomen: Nontender nondistended good bowel sounds Musculoskeletal: No edema noted Skin: No rashes seen Neurologic: CN 2-12 grossly intact.  And nonfocal Psychiatric: Poor judgment and insight  Objective: Vitals:   05/31/21 1422 05/31/21 2139 06/01/21 0456 06/01/21 1326  BP: 113/64 104/67 118/66 111/62  Pulse: 90 94 82 96  Resp: 18 20 20 20   Temp: 98.6 F (37 C) 98.7 F (37.1 C) 98.8 F (37.1 C) 98.9 F (37.2 C)  TempSrc: Oral Oral Oral Oral  SpO2: 100% 99% 98% 95%  Weight:      Height:        Intake/Output Summary (Last 24 hours) at 06/01/2021 1411 Last data filed at 06/01/2021 0900 Gross per 24 hour  Intake 100 ml  Output  1250 ml  Net -1150 ml   [facility] Weights   05/27/21 2003 05/29/21 0539  Weight: 77.1 kg 63.3 kg     Data Reviewed:   CBC: Recent Labs  Lab 05/27/21 2112 05/29/21 0936 05/30/21 0755 05/30/21 1627 05/31/21 0513  WBC 6.0 3.7* 3.4* 4.1 3.7*  NEUTROABS 4.8  --  2.7  --  2.7  HGB 6.1* 9.2* 8.1* 8.7* 8.2*  HCT 18.6* 27.3* 24.2* 26.3* 24.3*  MCV 94.9 90.7 90.3 92.6 91.7  PLT 42* 27* 24* 26* 26*   Basic Metabolic Panel: Recent Labs  Lab 05/27/21 2112 05/29/21 0936 05/30/21 0755 05/31/21 0513  NA 131* 132* 133* 135  K 5.0 4.4 4.4 4.1  CL 103 106 107 104  CO2 12* 13* 16* 23  GLUCOSE 130* 131* 111* 143*  BUN 83* 73* 77* 65*  CREATININE 3.18* 2.50* 2.70* 2.30*  CALCIUM 9.7 9.1 8.9 8.7*  MG  --   --   --  2.4  PHOS  --   --   --  4.0   GFR: Estimated Creatinine Clearance: 30.6 mL/min (A) (by C-G formula based on SCr of 2.3 mg/dL (H)). Liver Function Tests: Recent Labs  Lab 05/27/21 2112 05/29/21 0936 05/30/21 0755 05/31/21 0513  AST 49* 47* 42* 47*  ALT 15 14 14 14   ALKPHOS 180* 168* 151* 146*  BILITOT 1.7* 1.3* 0.9 0.8  PROT 8.0 7.0 6.9 6.4*  ALBUMIN 3.4* 3.2* 3.1* 2.9*   No results for input(s): LIPASE, AMYLASE in the last 168 hours. No results for input(s): AMMONIA in the last 168 hours. Coagulation Profile: No results for input(s): INR, PROTIME in the last 168 hours. Cardiac Enzymes: No results for input(s): CKTOTAL, CKMB, CKMBINDEX, TROPONINI in the last 168 hours. BNP (last 3 results) No results for input(s): PROBNP in the last 8760 hours. HbA1C: No results for input(s): HGBA1C in the last 72 hours. CBG: No results for input(s): GLUCAP in the last 168 hours. Lipid Profile: No results for input(s): CHOL, HDL, LDLCALC, TRIG, CHOLHDL, LDLDIRECT in the last 72 hours. Thyroid Function Tests: No results for input(s): TSH, T4TOTAL, FREET4, T3FREE, THYROIDAB in the last 72 hours. Anemia Panel: No results for input(s): VITAMINB12, FOLATE, FERRITIN, TIBC, IRON,  RETICCTPCT in the last 72 hours. Sepsis Labs: No results for input(s): PROCALCITON, LATICACIDVEN in the last 168 hours.  Recent Results (from the past 240 hour(s))  Resp Panel by RT-PCR (Flu A&B, Covid) Nasopharyngeal Swab     Status: None   Collection Time: 05/28/21  3:16 PM   Specimen: Nasopharyngeal Swab; Nasopharyngeal(NP) swabs in vial transport medium  Result Value Ref Range Status   SARS Coronavirus 2 by RT PCR NEGATIVE NEGATIVE Final    Comment: (NOTE) SARS-CoV-2 target nucleic acids are NOT DETECTED.  The SARS-CoV-2 RNA is generally detectable in upper respiratory specimens during the acute phase of infection. The lowest concentration of SARS-CoV-2 viral copies this assay can detect is 138 copies/mL. A negative result does not preclude SARS-Cov-2 infection and should not be used as the sole basis for treatment or other patient management decisions. A negative result may occur with  improper specimen collection/handling, submission of specimen other than nasopharyngeal swab, presence of viral mutation(s) within the areas targeted by this assay, and inadequate number of  viral copies(<138 copies/mL). A negative result must be combined with clinical observations, patient history, and epidemiological information. The expected result is Negative.  Fact Sheet for Patients:  EntrepreneurPulse.com.au  Fact Sheet for Healthcare Providers:  IncredibleEmployment.be  This test is no t yet approved or cleared by the [place] FDA and  has been authorized for detection and/or diagnosis of SARS-CoV-2 by FDA under an Emergency Use Authorization (EUA). This EUA will remain  in effect (meaning this test can be used) for the duration of the COVID-19 declaration under Section 564(b)(1) of the Act, 21 U.S.C.section 360bbb-3(b)(1), unless the authorization is terminated  or revoked sooner.       Influenza A by PCR NEGATIVE NEGATIVE Final   Influenza  B by PCR NEGATIVE NEGATIVE Final    Comment: (NOTE) The Xpert Xpress SARS-CoV-2/FLU/RSV plus assay is intended as an aid in the diagnosis of influenza from Nasopharyngeal swab specimens and should not be used as a sole basis for treatment. Nasal washings and aspirates are unacceptable for Xpert Xpress SARS-CoV-2/FLU/RSV testing.  Fact Sheet for Patients: EntrepreneurPulse.com.au  Fact Sheet for Healthcare Providers: IncredibleEmployment.be  This test is not yet approved or cleared by the [place] FDA and has been authorized for detection and/or diagnosis of SARS-CoV-2 by FDA under an Emergency Use Authorization (EUA). This EUA will remain in effect (meaning this test can be used) for the duration of the COVID-19 declaration under Section 564(b)(1) of the Act, 21 U.S.C. section 360bbb-3(b)(1), unless the authorization is terminated or revoked.  Performed at [facility], [place] [address], [place], [place] [address]          Radiology Studies: MR BRAIN WO CONTRAST  Result Date: 05/31/2021 CLINICAL DATA:  Brain/CNS neoplasm, staging; prostate cancer, abnormal CT EXAM: MRI HEAD WITHOUT CONTRAST TECHNIQUE: Multiplanar, multiecho pulse sequences of the brain and surrounding structures were obtained without intravenous contrast. COMPARISON:  Correlation made with CT head 05/27/2021 FINDINGS: Brain: There is no acute infarction. Edema is present in the right greater than left frontal white matter as well as bilateral anterior temporal white matter. There is adjacent susceptibility in the right frontal lobe likely reflecting blood products or mineralization. A small area of edema is present in the parasagittal high left frontoparietal region (series 10, image 42). Bilateral cerebral convexity extra-axial abnormal signal isointense to adjacent parenchyma with associated susceptibility that probably represents hyperostosis. Regional mass  effect is present including effacement of the frontal horns and mild leftward midline shift similar to the prior head CT. Vascular: Major vessel flow voids at the skull base are preserved. Skull and upper cervical spine: Diffusely abnormal marrow signal reflecting metastatic disease. Sinuses/Orbits: Paranasal sinuses are aerated. Orbits are unremarkable. Other: Sella is unremarkable.  Mastoid air cells are clear. IMPRESSION: Edema in the frontal and temporal lobes bilaterally. Abnormal extra-axial signal along the cerebral convexities. Within limitation of noncontrast study, favored to represent dural based metastatic disease with associated edema. Mass effect including mild midline shift similar to prior CT. Diffuse osseous metastatic disease. Electronically Signed   By: Macy Mis M.D.   On: 05/31/2021 19:45        Scheduled Meds:  dexamethasone (DECADRON) injection  4 mg Intravenous Q12H   mouth rinse  15 mL Mouth Rinse BID   oxyCODONE  10 mg Oral Q12H   Continuous Infusions:  dextrose 5 % and 0.9% NaCl       LOS: 4 days   Time spent= 35 mins    Nerida Boivin Chirag Izeah Vossler,  MD [facility]  If 7PM-7AM, please contact night-coverage  06/01/2021, 2:11 PM

## 2021-06-01 NOTE — Progress Notes (Addendum)
Spoke with the daugther, Jonathan Campbell [phone], regarding his poor prognosis.  Will transition him to DNR/DNI.  She will speak with the rest of the family members as she is considering transitioning him to Comfort care.    ADDENDUM 540PM Daughter called and is agreeable to transition ptn to comfort care. Orders placed.    Gerlean Ren MD [facility]

## 2021-06-02 NOTE — Progress Notes (Signed)
PROGRESS NOTE    Jonathan Campbell  AOZ:[id number] DOB: 1960-07-27 DOA: 05/27/2021 PCP: Sonia Side., FNP   Brief Narrative:  60 yo with hx prostate cancer metastatic to bone, T12 compression fracture, anemia of chronic disease who presented to the hospital for increased confusion.  He was admitted with AKI, anemia, and brian imaging concerning for metastatic disease.  Extensive work-up including MRI brain showed dural metastases and frontal and temporal lobe with edema.  Patient was started on dexamethasone.  Palliative care team has been seeing the patient.  Patient also has acute renal failure with bilateral hydronephrosis.  Seen by urology, no role for intervention.  After prolonged conversation with family he was transition to comfort care/hospice.   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  No further treatment available for him given his aggressive disease and overall poor prognosis.  Patient has been transitioned to comfort care.  Hospice team will be consulted.  Acute kidney injury with bilateral hydronephrosis - No further treatment options available per oncology team.  Patient is now comfort care - Received transfusion.  At this time patient remains extremely ill with very poor prognosis.  I am not sure if there is any further role of medical treatment and it is recommended that he should be transition to hospice/comfort care.     DVT prophylaxis: Place and maintain sequential compression device Start:  05/29/21 2020 SCDs Start: 05/28/21 0800  Code Status: DNR/DNI.  Comfort care Family Communication: Extensive discussion with the patient's daughter yesterday evening and had his brother at bedside  Status is: Inpatient  Remains inpatient appropriate because: Maintain hospital stay until we have safe disposition     Subjective: Patient is awake but overall somnolent and slow to respond.  Brother is present at bedside with whom I had a prolonged discussion this morning.  They are interested in hospice facility therefore agreeable to consult TOC.   Examination: Constitutional: Elderly cachectic frail.  Somnolent.  Overall appears very weak with bilateral temporal wasting. Respiratory: Clear to auscultation bilaterally Cardiovascular: Normal sinus rhythm, no rubs Abdomen: Nontender nondistended good bowel sounds Musculoskeletal: No edema noted Skin: No rashes seen Neurologic: CN 2-12 grossly intact.  And nonfocal Psychiatric: Poor judgment and insight   Objective: Vitals:   06/01/21 0456 06/01/21 1326 06/01/21 2040 06/02/21 0431  BP: 118/66 111/62 (!) 106/59 (!) 93/49  Pulse: 82 96 96 99  Resp: 20 20 20 20   Temp: 98.8 F (37.1 C) 98.9 F (37.2 C) 98.9 F (37.2 C) 99.6 F (37.6 C)  TempSrc: Oral Oral Oral Oral  SpO2: 98% 95% 98% 96%  Weight:      Height:        Intake/Output Summary (Last 24 hours) at 06/02/2021 1033 Last data filed at 06/02/2021 0708 Gross per 24 hour  Intake 994.69 ml  Output 500 ml  Net 494.69 ml   [facility] Weights   05/27/21 2003 05/29/21 0539  Weight: 77.1 kg 63.3 kg     Data Reviewed:   CBC: Recent Labs  Lab 05/27/21 2112 05/29/21 0936 05/30/21 0755 05/30/21 1627 05/31/21 0513  WBC 6.0 3.7* 3.4* 4.1 3.7*  NEUTROABS 4.8  --  2.7  --  2.7  HGB 6.1* 9.2* 8.1* 8.7* 8.2*  HCT 18.6* 27.3* 24.2* 26.3* 24.3*  MCV 94.9 90.7 90.3 92.6 91.7  PLT 42* 27* 24* 26* 26*   Basic Metabolic Panel: Recent Labs  Lab 05/27/21 2112 05/29/21 0936  05/30/21 0755 05/31/21 0513  NA 131* 132* 133* 135  K 5.0 4.4 4.4 4.1  CL 103 106 107 104  CO2 12* 13* 16* 23  GLUCOSE 130* 131* 111* 143*  BUN 83* 73* 77* 65*  CREATININE 3.18* 2.50* 2.70* 2.30*  CALCIUM 9.7 9.1 8.9 8.7*  MG  --   --   --  2.4  PHOS  --   --   --  4.0   GFR: Estimated Creatinine Clearance: 30.6 mL/min (A) (by C-G formula based on SCr of 2.3 mg/dL (H)). Liver Function Tests: Recent Labs  Lab 05/27/21 2112 05/29/21 0936 05/30/21 0755 05/31/21 0513  AST 49* 47* 42* 47*  ALT 15 14 14 14   ALKPHOS 180* 168* 151* 146*  BILITOT 1.7* 1.3* 0.9 0.8  PROT 8.0 7.0 6.9 6.4*  ALBUMIN 3.4* 3.2* 3.1* 2.9*   No results for input(s): LIPASE, AMYLASE in the last 168 hours. No results for input(s): AMMONIA in the last 168 hours. Coagulation Profile: No results for input(s): INR, PROTIME in the last 168 hours. Cardiac Enzymes: No results for input(s): CKTOTAL, CKMB, CKMBINDEX, TROPONINI in the last 168 hours. BNP (last 3 results) No results for input(s): PROBNP in the last 8760 hours. HbA1C: No results for input(s): HGBA1C in the last 72 hours. CBG: No results for input(s): GLUCAP in the last 168 hours. Lipid Profile: No results for input(s): CHOL, HDL, LDLCALC, TRIG, CHOLHDL, LDLDIRECT in the last 72 hours. Thyroid Function Tests: No results for input(s): TSH, T4TOTAL, FREET4, T3FREE, THYROIDAB in the last 72 hours. Anemia Panel: No results for input(s): VITAMINB12, FOLATE, FERRITIN, TIBC, IRON, RETICCTPCT in the last 72 hours. Sepsis Labs: No results for input(s): PROCALCITON, LATICACIDVEN in the last 168 hours.  Recent Results (from the past 240 hour(s))  Resp Panel by RT-PCR (Flu A&B, Covid) Nasopharyngeal Swab     Status: None   Collection Time: 05/28/21  3:16 PM   Specimen: Nasopharyngeal Swab; Nasopharyngeal(NP) swabs in vial transport medium  Result Value Ref Range Status   SARS Coronavirus 2 by RT PCR NEGATIVE NEGATIVE Final    Comment: (NOTE) SARS-CoV-2  target nucleic acids are NOT DETECTED.  The SARS-CoV-2 RNA is generally detectable in upper respiratory specimens during the acute phase of infection. The lowest concentration of SARS-CoV-2 viral copies this assay can detect is 138 copies/mL. A negative result does not preclude SARS-Cov-2 infection and should not be used as the sole basis for treatment or other patient management decisions. A negative result may occur with  improper specimen collection/handling, submission of specimen other than nasopharyngeal swab, presence of viral mutation(s) within the areas targeted by this assay, and inadequate number of viral copies(<138 copies/mL). A negative result must be combined with clinical observations, patient history, and epidemiological information. The expected result is Negative.  Fact Sheet for Patients:  EntrepreneurPulse.com.au  Fact Sheet for Healthcare Providers:  IncredibleEmployment.be  This test is no t yet approved or cleared by the [place] FDA and  has been authorized for detection and/or diagnosis of SARS-CoV-2 by FDA under an Emergency Use Authorization (EUA). This  EUA will remain  in effect (meaning this test can be used) for the duration of the COVID-19 declaration under Section 564(b)(1) of the Act, 21 U.S.C.section 360bbb-3(b)(1), unless the authorization is terminated  or revoked sooner.       Influenza A by PCR NEGATIVE NEGATIVE Final   Influenza B by PCR NEGATIVE NEGATIVE Final    Comment: (NOTE) The Xpert Xpress SARS-CoV-2/FLU/RSV plus assay is intended as an aid in the diagnosis of influenza from Nasopharyngeal swab specimens and should not be used as a sole basis for treatment. Nasal washings and aspirates are unacceptable for Xpert Xpress SARS-CoV-2/FLU/RSV testing.  Fact Sheet for Patients: EntrepreneurPulse.com.au  Fact Sheet for Healthcare  Providers: IncredibleEmployment.be  This test is not yet approved or cleared by the [place] FDA and has been authorized for detection and/or diagnosis of SARS-CoV-2 by FDA under an Emergency Use Authorization (EUA). This EUA will remain in effect (meaning this test can be used) for the duration of the COVID-19 declaration under Section 564(b)(1) of the Act, 21 U.S.C. section 360bbb-3(b)(1), unless the authorization is terminated or revoked.  Performed at [facility], [place] [address], [place], [place] [address]          Radiology Studies: MR BRAIN WO CONTRAST  Result Date: 05/31/2021 CLINICAL DATA:  Brain/CNS neoplasm, staging; prostate cancer, abnormal CT EXAM: MRI HEAD WITHOUT CONTRAST TECHNIQUE: Multiplanar, multiecho pulse sequences of the brain and surrounding structures were obtained without intravenous contrast. COMPARISON:  Correlation made with CT head 05/27/2021 FINDINGS: Brain: There is no acute infarction. Edema is present in the right greater than left frontal white matter as well as bilateral anterior temporal white matter. There is adjacent susceptibility in the right frontal lobe likely reflecting blood products or mineralization. A small area of edema is present in the parasagittal high left frontoparietal region (series 10, image 42). Bilateral cerebral convexity extra-axial abnormal signal isointense to adjacent parenchyma with associated susceptibility that probably represents hyperostosis. Regional mass effect is present including effacement of the frontal horns and mild leftward midline shift similar to the prior head CT. Vascular: Major vessel flow voids at the skull base are preserved. Skull and upper cervical spine: Diffusely abnormal marrow signal reflecting metastatic disease. Sinuses/Orbits: Paranasal sinuses are aerated. Orbits are unremarkable. Other: Sella is unremarkable.  Mastoid air cells are clear. IMPRESSION: Edema  in the frontal and temporal lobes bilaterally. Abnormal extra-axial signal along the cerebral convexities. Within limitation of noncontrast study, favored to represent dural based metastatic disease with associated edema. Mass effect including mild midline shift similar to prior CT. Diffuse osseous metastatic disease. Electronically Signed   By: Macy Mis M.D.   On: 05/31/2021 19:45        Scheduled Meds:  baclofen  5 mg Oral TID   dexamethasone (DECADRON) injection  4 mg Intravenous Q12H   mouth rinse  15 mL Mouth Rinse BID   oxyCODONE  10 mg Oral Q12H   risperiDONE  0.5 mg Oral BID   Continuous Infusions:  dextrose 5 % and 0.9% NaCl 75 mL/hr at 06/01/21 1426     LOS: 5 days   Time spent= 35 mins    Shantanu Strauch Arsenio Loader, MD [facility]  If 7PM-7AM, please contact night-coverage  06/02/2021, 10:33 AM

## 2021-06-02 NOTE — Care Management Important Message (Signed)
Important Message  Patient Details IM Letter placed in Patients room. Name: Jonathan Campbell MRN: [id number] Date of Birth: 11-27-1960   Medicare Important Message Given:  Yes     Kerin Salen 06/02/2021, 12:34 PM

## 2021-06-02 NOTE — TOC Transition Note (Signed)
Transition of Care [facility]) - CM/SW Discharge Note   Patient Details  Name: Jonathan Campbell MRN: [id number] Date of Birth: Sep 03, 1960  [facility] CM/SW Contact:  Dessa Phi, RN Phone Number: 06/02/2021, 3:50 PM   Clinical Narrative: Accepted for [facility]-PTAR packet @ [facility] will manage PTAR. No further CM needs.      Final next level of care: [place] Barriers to Discharge: No Barriers Identified   Patient Goals and CMS Choice Patient states their goals for this hospitalization and ongoing recovery are:: go home   Choice offered to / list presented to : Patient  Discharge Placement              Patient chooses bed at: Other - please specify in the comment section below: ([place]) Patient to be transferred to facility by: [place] Name of family member notified: [name] s/o [phone] Patient and family notified of of transfer: 06/02/21  Discharge Plan and Services   Discharge Planning Services: CM Consult                                 Social Determinants of Health ([place]) Interventions     Readmission Risk Interventions Readmission Risk Prevention Plan 06/02/2021  Transportation Screening Complete  PCP or Specialist Appt within 3-5 Days Complete  HRI or [place] Complete  Social Work Consult for [place] Planning/Counseling Complete  Palliative Care Screening Complete  Medication Review Press photographer) Complete  Some recent data might be hidden

## 2021-06-02 NOTE — Progress Notes (Signed)
RN called report to [place] at [facility]. All questions were answered.

## 2021-06-02 NOTE — TOC Initial Note (Addendum)
Transition of Care [facility]) - Initial/Assessment Note    Patient Details  Name: Jonathan Campbell MRN: [id number] Date of Birth: December 25, 1960  Transition of Care [facility]) CM/SW Contact:    Dessa Phi, RN Phone Number: 06/02/2021, 10:25 AM  Clinical Narrative:  Noted xrt onc-recc comfort care/hospice. Plaliative care team following. Await referral for d/c needs.  12:31p-Spoke to [facility] s/o-she will meet me @ 1p-offer resources.                Expected Discharge Plan:  (TBD) Barriers to Discharge: Continued Medical Work up   Patient Goals and CMS Choice Patient states their goals for this hospitalization and ongoing recovery are:: go home   Choice offered to / list presented to : Patient  Expected Discharge Plan and Services Expected Discharge Plan:  (TBD)   Discharge Planning Services: CM Consult   Living arrangements for the past 2 months: Single Family Home                                      Prior Living Arrangements/Services Living arrangements for the past 2 months: Single Family Home Lives with:: Significant Other Patient language and need for interpreter reviewed:: Yes Do you feel safe going back to the place where you live?: Yes      Need for Family Participation in Patient Care: No (Comment) Care giver support system in place?: Yes (comment)   Criminal Activity/Legal Involvement Pertinent to Current Situation/Hospitalization: No - Comment as needed  Activities of Daily Living Home Assistive Devices/Equipment: None ADL Screening (condition at time of admission) Patient's cognitive ability adequate to safely complete daily activities?: No Is the patient deaf or have difficulty hearing?: No Does the patient have difficulty seeing, even when wearing glasses/contacts?: No Does the patient have difficulty concentrating, remembering, or making decisions?: Yes Patient able to express need for assistance with ADLs?: Yes Does the patient have difficulty dressing or  bathing?: Yes (secondary to weakness) Independently performs ADLs?: No (secondary to weakness) Communication: Independent Dressing (OT): Needs assistance Is this a change from baseline?: Change from baseline, expected to last >3 days Grooming: Needs assistance Is this a change from baseline?: Change from baseline, expected to last >3 days Feeding: Needs assistance Is this a change from baseline?: Change from baseline, expected to last >3 days Bathing: Needs assistance Is this a change from baseline?: Change from baseline, expected to last >3 days Toileting: Needs assistance Is this a change from baseline?: Change from baseline, expected to last >3days In/Out Bed: Needs assistance Is this a change from baseline?: Change from baseline, expected to last >3 days Walks in Home: Needs assistance Is this a change from baseline?: Change from baseline, expected to last >3 days Does the patient have difficulty walking or climbing stairs?: Yes (secondary to weakness) Weakness of Legs: Both Weakness of Arms/Hands: None  Permission Sought/Granted Permission sought to share information with : Case Manager Permission granted to share information with : Yes, Verbal Permission Granted  Share Information with NAME: Case manager     Permission granted to share info w Relationship: Stanton Kidney s/o [phone]     Emotional Assessment Appearance:: Appears stated age Attitude/Demeanor/Rapport: Gracious Affect (typically observed): Accepting Orientation: : Oriented to Self, Oriented to Place, Oriented to  Time Alcohol / Substance Use: Not Applicable Psych Involvement: No (comment)  Admission diagnosis:  AKI (acute kidney injury) ([place]) [N17.9] Prostate cancer metastatic to bone [facility]) [  C61, C79.51] Anemia, unspecified type [D64.9] Patient Active Problem List   Diagnosis Date Noted   Acute metabolic encephalopathy 66/11/3014   Brain lesion 07/06/3233   Metabolic acidosis 57/32/2025   AKI (acute kidney  injury) ([place]) 05/28/2021   Symptomatic anemia 05/28/2021   Anemia of chronic disease 05/28/2021   Thrombocytopenia ([place]) 05/28/2021   Abnormal LFTs (liver function tests) 05/28/2021   Hyponatremia 05/28/2021   Mild protein malnutrition ([place]) 05/28/2021   Bilateral hydronephrosis 05/28/2021   Acute encephalopathy 05/28/2021   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 [place] [address] Phone: [phone] Fax: [phone]     Social Determinants of Health (SDOH) Interventions    Readmission Risk Interventions No flowsheet data found.

## 2021-06-02 NOTE — Progress Notes (Addendum)
Manufacturing engineer [facility]) Hospital Liaison note.    Further addendum: Consents are complete.  TOC and RN aware. Pt can transport any time.  Addendum:  Pt's daughter has accepted the bed for transfer today to [facility].  TOC made aware via voicemail.  Consents are in process at this time.  [facility] will update the care team once consents are complete; pt can transport at that time.  Report can be called at anytime to [phone].  Please be sure a signed DNR form transports with the patient.  ____________________________________ Chart reviewed and eligibility confirmed for [facility]. Spoke with family at bedside and with significant other [name] by phone  to confirm interest and explain services.   Pt's aunt and two sisters at bedside ask that pt's daughter, Jon Gills, be contacted for paperwork to transfer to [facility].  Pt's SO Mary in agreement. This liaison called the number given by family for Ms Hammonds, [phone], and reached an unlabeled voicemmail box.  HIPAA compliant voicemail left.  This liaison will follow up with family at bedside.  Thank you for the opportunity to participate in this patient's care.  Domenic Moras, BSN, RN [facility] Liaison (listed on [place] under Hospice/[facility])    [phone] (24h on call)

## 2021-06-02 NOTE — TOC Transition Note (Signed)
Transition of Care [facility]) - CM/SW Discharge Note   Patient Details  Name: Jonathan Campbell MRN: [id number] Date of Birth: [birth year]  Transition of Care [facility]) CM/SW Contact:  Dessa Phi, RN Phone Number: 06/02/2021, 1:07 PM   Clinical Narrative: Referral for residential hospice-spoke to [facility] s/o-chose-[place] [facility] to eval for acceptance-await outcome.      Final next level of care: [place] Barriers to Discharge: Continued Medical Work up   Patient Goals and CMS Choice Patient states their goals for this hospitalization and ongoing recovery are:: go home   Choice offered to / list presented to : Patient  Discharge Placement                       Discharge Plan and Services   Discharge Planning Services: CM Consult                                 Social Determinants of Health (SDOH) Interventions     Readmission Risk Interventions Readmission Risk Prevention Plan 06/02/2021  Transportation Screening Complete  PCP or Specialist Appt within 3-5 Days Complete  HRI or [place] Complete  Social Work Consult for [place] Planning/Counseling Complete  Palliative Care Screening Complete  Medication Review Press photographer) Complete  Some recent data might be hidden

## 2021-06-03 NOTE — Discharge Summary (Signed)
Physician Discharge Summary  Jonathan Campbell GGY:[id number] DOB: 1961/03/29 DOA: 05/27/2021  PCP: Sonia Side., FNP  Admit date: 05/27/2021 Discharge date: 06/03/2021  Admitted From: Home Disposition: Hospice facility  Recommendations for Outpatient Follow-up:  Discharging patient to hospice   Discharge Condition: Stable CODE STATUS: DNR/DNI, Diet recommendation: Comfort feeding  Brief/Interim Summary: 60 yo with hx prostate cancer metastatic to bone, T12 compression fracture, anemia of chronic disease who presented to the hospital for increased confusion.  He was admitted with AKI, anemia, and brian imaging concerning for metastatic disease.  Extensive work-up including MRI brain showed dural metastases and frontal and temporal lobe with edema.  Patient was started on dexamethasone.  Palliative care team has been seeing the patient.  Patient also has acute renal failure with bilateral hydronephrosis.  Seen by urology, no role for intervention.  After prolonged conversation with family he was transition to comfort care/hospice.  Body mass index is 20.61 kg/m.         Discharge Diagnoses:  Principal Problem:   Acute metabolic encephalopathy Active Problems:   Prostate cancer metastatic to bone (HCC)   AKI (acute kidney injury) ([place])   Symptomatic anemia   Anemia of chronic disease   Thrombocytopenia (HCC)   Abnormal LFTs (liver function tests)   Hyponatremia   Mild protein malnutrition (HCC)   Bilateral hydronephrosis   Acute encephalopathy   Brain lesion   Metabolic acidosis      Consultations: Urology Radiation oncology Oncology  Subjective: No complaints laying in the bed.  Family at bedside  Discharge Exam: Vitals:   06/01/21 2040 06/02/21 0431  BP: (!) 106/59 (!) 93/49  Pulse: 96 99  Resp: 20 20  Temp: 98.9 F (37.2 C) 99.6 F (37.6 C)  SpO2: 98% 96%   Vitals:   06/01/21 0456 06/01/21 1326 06/01/21 2040 06/02/21 0431  BP: 118/66 111/62 (!)  106/59 (!) 93/49  Pulse: 82 96 96 99  Resp: 20 20 20 20   Temp: 98.8 F (37.1 C) 98.9 F (37.2 C) 98.9 F (37.2 C) 99.6 F (37.6 C)  TempSrc: Oral Oral Oral Oral  SpO2: 98% 95% 98% 96%  Weight:      Height:         Discharge Instructions   Allergies as of 06/02/2021   No Known Allergies      Medication List     STOP taking these medications    acetaminophen 500 MG tablet Commonly known as: TYLENOL   Oxycodone HCl 10 MG Tabs   polyethylene glycol 17 g packet Commonly known as: MIRALAX / GLYCOLAX   Xtampza ER 18 MG C12a Generic drug: oxyCODONE ER   Xtandi 80 MG tablet Generic drug: enzalutamide        No Known Allergies  You were cared for by a hospitalist during your hospital stay. If you have any questions about your discharge medications or the care you received while you were in the hospital after you are discharged, you can call the unit and asked to speak with the hospitalist on call if the hospitalist that took care of you is not available. Once you are discharged, your primary care physician will handle any further medical issues. Please note that no refills for any discharge medications will be authorized once you are discharged, as it is imperative that you return to your primary care physician (or establish a relationship with a primary care physician if you do not have one) for your aftercare needs so that they can reassess  your need for medications and monitor your lab values.   Procedures/Studies: CT HEAD WO CONTRAST (5MM)  Result Date: 05/27/2021 CLINICAL DATA:  Encephalopathy EXAM: CT HEAD WITHOUT CONTRAST TECHNIQUE: Contiguous axial images were obtained from the base of the skull through the vertex without intravenous contrast. COMPARISON:  None. FINDINGS: Brain: Vasogenic pattern edema in the right greater than left frontal lobes, likely crossing at the corpus callosum. This is concerning for a high-grade neoplasm. No acute hemorrhage or  extra-axial collection. No hydrocephalus. Mild leftward midline shift. Vascular: No abnormal hyperdensity of the major intracranial arteries or dural venous sinuses. No intracranial atherosclerosis. Skull: The visualized skull base, calvarium and extracranial soft tissues are normal. Sinuses/Orbits: No fluid levels or advanced mucosal thickening of the visualized paranasal sinuses. No mastoid or middle ear effusion. The orbits are normal. IMPRESSION: 1. Vasogenic pattern edema in the right greater than left frontal lobes, likely crossing at the corpus callosum, concerning for a high-grade neoplasm. MRI of the brain with and without contrast is recommended for further characterization. 2. No acute hemorrhage. Electronically Signed   By: Ulyses Jarred M.D.   On: 05/27/2021 23:25   MR BRAIN WO CONTRAST  Result Date: 05/31/2021 CLINICAL DATA:  Brain/CNS neoplasm, staging; prostate cancer, abnormal CT EXAM: MRI HEAD WITHOUT CONTRAST TECHNIQUE: Multiplanar, multiecho pulse sequences of the brain and surrounding structures were obtained without intravenous contrast. COMPARISON:  Correlation made with CT head 05/27/2021 FINDINGS: Brain: There is no acute infarction. Edema is present in the right greater than left frontal white matter as well as bilateral anterior temporal white matter. There is adjacent susceptibility in the right frontal lobe likely reflecting blood products or mineralization. A small area of edema is present in the parasagittal high left frontoparietal region (series 10, image 42). Bilateral cerebral convexity extra-axial abnormal signal isointense to adjacent parenchyma with associated susceptibility that probably represents hyperostosis. Regional mass effect is present including effacement of the frontal horns and mild leftward midline shift similar to the prior head CT. Vascular: Major vessel flow voids at the skull base are preserved. Skull and upper cervical spine: Diffusely abnormal marrow  signal reflecting metastatic disease. Sinuses/Orbits: Paranasal sinuses are aerated. Orbits are unremarkable. Other: Sella is unremarkable.  Mastoid air cells are clear. IMPRESSION: Edema in the frontal and temporal lobes bilaterally. Abnormal extra-axial signal along the cerebral convexities. Within limitation of noncontrast study, favored to represent dural based metastatic disease with associated edema. Mass effect including mild midline shift similar to prior CT. Diffuse osseous metastatic disease. Electronically Signed   By: Macy Mis M.D.   On: 05/31/2021 19:45   CT Renal Stone Study  Result Date: 05/27/2021 CLINICAL DATA:  Renal failure. History of metastatic prostate cancer. EXAM: CT ABDOMEN AND PELVIS WITHOUT CONTRAST TECHNIQUE: Multidetector CT imaging of the abdomen and pelvis was performed following the standard protocol without IV contrast. COMPARISON:  CT abdomen and pelvis 08/28/2019. FINDINGS: Lower chest: No acute abnormality. Hepatobiliary: There is a rounded 10 cm lesion in the dome of the liver. This has mildly increased in and is now mildly hyperdense when compared to the prior study. Additional innumerable cystic structures throughout the liver are again noted, some of which have mildly increased in size. There is moderate hepatomegaly, mildly increased from prior study. The liver is enlarged, unchanged. Gallbladder appears within normal limits. Pancreas: Unremarkable. No pancreatic ductal dilatation or surrounding inflammatory changes. Spleen: Normal in size without focal abnormality. Adrenals/Urinary Tract: There is mild left and moderate right-sided hydroureteronephrosis to the level  the pelvic inlet. No obstructing calculi are seen. No bladder calculi are seen. There is posterior right bladder wall thickening measuring up to 16 mm which is new. Stomach/Bowel: Stomach is within normal limits. Appendix appears normal. No evidence of bowel wall thickening, distention, or inflammatory  changes. The appendix is not seen. Vascular/Lymphatic: Aorta is normal in size. There is new left retroperitoneal lymphadenopathy with the largest lymph node measuring 2.4 x 2.7 cm image 504/45. There is new bulky bilateral iliac chain lymphadenopathy. Discrete margins are difficult to evaluate secondary to lack of contrast and lack of intraperitoneal fat. Largest right soft tissue mass measures 4.2 x 4.1 cm. Largest left sided lymph node measures 3.6 x 2.5 cm. There are new bilateral enlarged inguinal lymph nodes measuring up to 2.1 x 3.3 cm. There also new bilateral enlarged axillary lymph nodes measuring up to 16 mm short axis. Reproductive: Prostate gland is heterogeneous enlarged. This has increased in size and now appears to invade the posterior right aspect of the bladder. Masslike density protruding from the prostate at the right bladder base measures 4.6 x 5.0 cm. Other: There is no ascites or focal abdominal wall hernia. There is bilateral gynecomastia. Musculoskeletal: Vertebroplasty changes are again seen at T12. There are increasing mixed lucent and sclerotic lesions throughout the spine, sacrum and pelvis. No acute fractures are seen. IMPRESSION: 1. Prostate gland has enlarged in the interval and there is a new masslike protrusion invading the right bladder base. There is right bladder wall thickening. Findings are worrisome for prostate cancer with direct extension. 2. There is moderate right-sided hydronephrosis and mild left-sided hydronephrosis to the level of the pelvis. No obstructing calculus. 3. New bulky lymphadenopathy throughout the retroperitoneum, iliac chains and inguinal regions worrisome for metastatic disease or lymphoma. New enlarged bilateral axillary lymph nodes. 4. Moderate hepatomegaly has increased from the prior examination. Numerous hepatic cysts are again seen, some of which have increased in size. Largest lesion in the dome of the liver measures 10 cm and now appears mildly  hyperdense. This may be due to interval hemorrhage within this cyst. 5. Mixed lucent and sclerotic lesions throughout the osseous structures compatible with metastatic disease. Electronically Signed   By: Ronney Asters M.D.   On: 05/27/2021 23:26     The results of significant diagnostics from this hospitalization (including imaging, microbiology, ancillary and laboratory) are listed below for reference.     Microbiology: Recent Results (from the past 240 hour(s))  Resp Panel by RT-PCR (Flu A&B, Covid) Nasopharyngeal Swab     Status: None   Collection Time: 05/28/21  3:16 PM   Specimen: Nasopharyngeal Swab; Nasopharyngeal(NP) swabs in vial transport medium  Result Value Ref Range Status   SARS Coronavirus 2 by RT PCR NEGATIVE NEGATIVE Final    Comment: (NOTE) SARS-CoV-2 target nucleic acids are NOT DETECTED.  The SARS-CoV-2 RNA is generally detectable in upper respiratory specimens during the acute phase of infection. The lowest concentration of SARS-CoV-2 viral copies this assay can detect is 138 copies/mL. A negative result does not preclude SARS-Cov-2 infection and should not be used as the sole basis for treatment or other patient management decisions. A negative result may occur with  improper specimen collection/handling, submission of specimen other than nasopharyngeal swab, presence of viral mutation(s) within the areas targeted by this assay, and inadequate number of viral copies(<138 copies/mL). A negative result must be combined with clinical observations, patient history, and epidemiological information. The expected result is Negative.  Fact Sheet for Patients:  EntrepreneurPulse.com.au  Fact Sheet for Healthcare Providers:  IncredibleEmployment.be  This test is no t yet approved or cleared by the [place] FDA and  has been authorized for detection and/or diagnosis of SARS-CoV-2 by FDA under an Emergency Use Authorization  (EUA). This EUA will remain  in effect (meaning this test can be used) for the duration of the COVID-19 declaration under Section 564(b)(1) of the Act, 21 U.S.C.section 360bbb-3(b)(1), unless the authorization is terminated  or revoked sooner.       Influenza A by PCR NEGATIVE NEGATIVE Final   Influenza B by PCR NEGATIVE NEGATIVE Final    Comment: (NOTE) The Xpert Xpress SARS-CoV-2/FLU/RSV plus assay is intended as an aid in the diagnosis of influenza from Nasopharyngeal swab specimens and should not be used as a sole basis for treatment. Nasal washings and aspirates are unacceptable for Xpert Xpress SARS-CoV-2/FLU/RSV testing.  Fact Sheet for Patients: EntrepreneurPulse.com.au  Fact Sheet for Healthcare Providers: IncredibleEmployment.be  This test is not yet approved or cleared by the [place] FDA and has been authorized for detection and/or diagnosis of SARS-CoV-2 by FDA under an Emergency Use Authorization (EUA). This EUA will remain in effect (meaning this test can be used) for the duration of the COVID-19 declaration under Section 564(b)(1) of the Act, 21 U.S.C. section 360bbb-3(b)(1), unless the authorization is terminated or revoked.  Performed at [facility], [place] [address] [place], [place] [id number]      Labs: BNP (last 3 results) No results for input(s): BNP in the last 8760 hours. Basic Metabolic Panel: Recent Labs  Lab 05/27/21 2112 05/29/21 0936 05/30/21 0755 05/31/21 0513  NA 131* 132* 133* 135  K 5.0 4.4 4.4 4.1  CL 103 106 107 104  CO2 12* 13* 16* 23  GLUCOSE 130* 131* 111* 143*  BUN 83* 73* 77* 65*  CREATININE 3.18* 2.50* 2.70* 2.30*  CALCIUM 9.7 9.1 8.9 8.7*  MG  --   --   --  2.4  PHOS  --   --   --  4.0   Liver Function Tests: Recent Labs  Lab 05/27/21 2112 05/29/21 0936 05/30/21 0755 05/31/21 0513  AST 49* 47* 42* 47*  ALT 15 14 14 14   ALKPHOS 180* 168* 151* 146*   BILITOT 1.7* 1.3* 0.9 0.8  PROT 8.0 7.0 6.9 6.4*  ALBUMIN 3.4* 3.2* 3.1* 2.9*   No results for input(s): LIPASE, AMYLASE in the last 168 hours. No results for input(s): AMMONIA in the last 168 hours. CBC: Recent Labs  Lab 05/27/21 2112 05/29/21 0936 05/30/21 0755 05/30/21 1627 05/31/21 0513  WBC 6.0 3.7* 3.4* 4.1 3.7*  NEUTROABS 4.8  --  2.7  --  2.7  HGB 6.1* 9.2* 8.1* 8.7* 8.2*  HCT 18.6* 27.3* 24.2* 26.3* 24.3*  MCV 94.9 90.7 90.3 92.6 91.7  PLT 42* 27* 24* 26* 26*   Cardiac Enzymes: No results for input(s): CKTOTAL, CKMB, CKMBINDEX, TROPONINI in the last 168 hours. BNP: Invalid input(s): POCBNP CBG: No results for input(s): GLUCAP in the last 168 hours. D-Dimer No results for input(s): DDIMER in the last 72 hours. Hgb A1c No results for input(s): HGBA1C in the last 72 hours. Lipid Profile No results for input(s): CHOL, HDL, LDLCALC, TRIG, CHOLHDL, LDLDIRECT in the last 72 hours. Thyroid function studies No results for input(s): TSH, T4TOTAL, T3FREE, THYROIDAB in the last 72 hours.  Invalid input(s): FREET3 Anemia work up No results for input(s): VITAMINB12, FOLATE, FERRITIN, TIBC, IRON, RETICCTPCT in the last 4  hours. Urinalysis    Component Value Date/Time   COLORURINE YELLOW 05/28/2021 0650   APPEARANCEUR CLEAR 05/28/2021 0650   LABSPEC 1.011 05/28/2021 0650   PHURINE 5.0 05/28/2021 0650   GLUCOSEU NEGATIVE 05/28/2021 0650   HGBUR SMALL (A) 05/28/2021 0650   BILIRUBINUR NEGATIVE 05/28/2021 0650   KETONESUR 5 (A) 05/28/2021 0650   PROTEINUR NEGATIVE 05/28/2021 0650   NITRITE NEGATIVE 05/28/2021 0650   LEUKOCYTESUR NEGATIVE 05/28/2021 0650   Sepsis Labs Invalid input(s): PROCALCITONIN,  WBC,  LACTICIDVEN Microbiology Recent Results (from the past 240 hour(s))  Resp Panel by RT-PCR (Flu A&B, Covid) Nasopharyngeal Swab     Status: None   Collection Time: 05/28/21  3:16 PM   Specimen: Nasopharyngeal Swab; Nasopharyngeal(NP) swabs in vial transport  medium  Result Value Ref Range Status   SARS Coronavirus 2 by RT PCR NEGATIVE NEGATIVE Final    Comment: (NOTE) SARS-CoV-2 target nucleic acids are NOT DETECTED.  The SARS-CoV-2 RNA is generally detectable in upper respiratory specimens during the acute phase of infection. The lowest concentration of SARS-CoV-2 viral copies this assay can detect is 138 copies/mL. A negative result does not preclude SARS-Cov-2 infection and should not be used as the sole basis for treatment or other patient management decisions. A negative result may occur with  improper specimen collection/handling, submission of specimen other than nasopharyngeal swab, presence of viral mutation(s) within the areas targeted by this assay, and inadequate number of viral copies(<138 copies/mL). A negative result must be combined with clinical observations, patient history, and epidemiological information. The expected result is Negative.  Fact Sheet for Patients:  EntrepreneurPulse.com.au  Fact Sheet for Healthcare Providers:  IncredibleEmployment.be  This test is no t yet approved or cleared by the [place] FDA and  has been authorized for detection and/or diagnosis of SARS-CoV-2 by FDA under an Emergency Use Authorization (EUA). This EUA will remain  in effect (meaning this test can be used) for the duration of the COVID-19 declaration under Section 564(b)(1) of the Act, 21 U.S.C.section 360bbb-3(b)(1), unless the authorization is terminated  or revoked sooner.       Influenza A by PCR NEGATIVE NEGATIVE Final   Influenza B by PCR NEGATIVE NEGATIVE Final    Comment: (NOTE) The Xpert Xpress SARS-CoV-2/FLU/RSV plus assay is intended as an aid in the diagnosis of influenza from Nasopharyngeal swab specimens and should not be used as a sole basis for treatment. Nasal washings and aspirates are unacceptable for Xpert Xpress SARS-CoV-2/FLU/RSV testing.  Fact Sheet for  Patients: EntrepreneurPulse.com.au  Fact Sheet for Healthcare Providers: IncredibleEmployment.be  This test is not yet approved or cleared by the [place] FDA and has been authorized for detection and/or diagnosis of SARS-CoV-2 by FDA under an Emergency Use Authorization (EUA). This EUA will remain in effect (meaning this test can be used) for the duration of the COVID-19 declaration under Section 564(b)(1) of the Act, 21 U.S.C. section 360bbb-3(b)(1), unless the authorization is terminated or revoked.  Performed at [facility], [place] [address], [place], [place] [address]      Time coordinating discharge:  I have spent 35 minutes face to face with the patient and on the ward discussing the patients care, assessment, plan and disposition with other care givers. >50% of the time was devoted counseling the patient about the risks and benefits of treatment/Discharge disposition and coordinating care.   SIGNED:   Damita Lack, MD  [facility] 06/03/2021, 11:31 AM   If 7PM-7AM, please contact night-coverage

## 2021-06-09 ENCOUNTER — Other Ambulatory Visit (HOSPITAL_COMMUNITY): Payer: Self-pay

## 2021-06-19 ENCOUNTER — Inpatient Hospital Stay: Payer: Medicare Other

## 2021-06-19 ENCOUNTER — Inpatient Hospital Stay: Payer: Medicare Other | Attending: Oncology

## 2021-06-19 ENCOUNTER — Inpatient Hospital Stay: Payer: Medicare Other | Admitting: Oncology

## 2021-06-28 DEATH — deceased
